# Patient Record
Sex: Female | Born: 1995
Health system: Southern US, Community
[De-identification: ages and names within clinical notes are randomized; demographics above are authoritative.]

## PROBLEM LIST (undated history)

## (undated) ENCOUNTER — Inpatient Hospital Stay (HOSPITAL_COMMUNITY): Payer: Self-pay

## (undated) DIAGNOSIS — K8020 Calculus of gallbladder without cholecystitis without obstruction: Secondary | ICD-10-CM

## (undated) DIAGNOSIS — K760 Fatty (change of) liver, not elsewhere classified: Secondary | ICD-10-CM

## (undated) DIAGNOSIS — E119 Type 2 diabetes mellitus without complications: Secondary | ICD-10-CM

## (undated) DIAGNOSIS — T7840XA Allergy, unspecified, initial encounter: Secondary | ICD-10-CM

## (undated) DIAGNOSIS — K219 Gastro-esophageal reflux disease without esophagitis: Secondary | ICD-10-CM

## (undated) DIAGNOSIS — O165 Unspecified maternal hypertension, complicating the puerperium: Secondary | ICD-10-CM

## (undated) DIAGNOSIS — I10 Essential (primary) hypertension: Secondary | ICD-10-CM

## (undated) DIAGNOSIS — Z6841 Body Mass Index (BMI) 40.0 and over, adult: Secondary | ICD-10-CM

## (undated) DIAGNOSIS — O24419 Gestational diabetes mellitus in pregnancy, unspecified control: Secondary | ICD-10-CM

## (undated) HISTORY — PX: TONSILLECTOMY AND ADENOIDECTOMY: SHX28

## (undated) HISTORY — DX: Allergy, unspecified, initial encounter: T78.40XA

## (undated) HISTORY — DX: Gastro-esophageal reflux disease without esophagitis: K21.9

## (undated) HISTORY — PX: MYRINGOTOMY: SUR874

---
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
## 2016-11-27 NOTE — Progress Notes (Signed)
Subjective: RU:EAVWUJWJXCC:establish care HPI: Anna [name] is a 21 y.o. female presenting to clinic today for:  She reports that she has been in fairly good health with the exception of a recent external ear infection.   She was seen at the urgent care and treated with a topical eardrop.  She notes improvement in her symptoms.  No fevers, chills, dizziness, ear pain.  Patient was previously seen by pediatrician but notes that she has not been seen in several years.  She notes a past medical history significant for hypertension as a child.  She reports that she was treated with antihypertensive medications but this induced hypotension and therefore these were discontinued.  She also reports a history of possible Hashimoto's.  She reports that she had fluctuations in her thyroid levels and was seen by an endocrinologist.  She has not been put on medications.  She has a maternal aunt with a history of thyroid disorder but no other immediate family with thyroid disorder or autoimmune disorder.  She reports decreased energy, difficulty losing weight and cold intolerance.  Patient reports heavy menstrual cycles as well.  She does note that they only last about 4-5 days but that she can use a pack and a half of pads and just a couple of days.   she has never had a Pap smear.  She has been sexually active in the past but is not currently.  No abnormal vaginal discharge, pelvic pain.  Past Medical History:  Diagnosis Date  . Allergy   . GERD (gastroesophageal reflux disease)    Past Surgical History:  Procedure Laterality Date  . TONSILLECTOMY AND ADENOIDECTOMY     Social History   Socioeconomic History  . Marital status: Single    Spouse name: Not on file  . Number of children: Not on file  . Years of education: Not on file  . Highest education level: Not on file  Social Needs  . Financial resource strain: Not on file  . Food insecurity - worry: Not on file  . Food insecurity - inability: Not  on file  . Transportation needs - medical: Not on file  . Transportation needs - non-medical: Not on file  Occupational History  . 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 Smoking status: Never Smoker  . Smokeless tobacco: Never Used  Substance and Sexual Activity  . Alcohol use: No  . Drug use: No  . Sexual activity: No    Birth control/protection: Abstinence  Other Topics Concern  . Not on file  Social History Narrative  . Not on file   Current Meds  Medication Sig  . ibuprofen (ADVIL,MOTRIN) 800 MG tablet Take 800 mg every 8 (eight) hours as needed by mouth.   Family History  Problem Relation Age of Onset  . Asthma Mother   . Anemia Mother   . Hypertension Father   . Cancer Maternal Grandfather   . Hypertension Paternal Grandmother   . Heart failure Paternal Grandmother    Allergies  Allergen Reactions  . Diflucan [Fluconazole] Rash     Health Maintenance: Flu shot due ROS: Per HPI  Objective: Office vital signs reviewed. BP 125/77   Pulse 86   Temp 97.9 F (36.6 C) (Oral)   Ht 5\' 2"  (1.575 m)   Wt (!) 379 lb (171.9 kg)   BMI 69.32 kg/m   Physical Examination:  General: Awake, alert, obese, No acute distress HEENT: Normal  Neck: No masses palpated. No lymphadenopathy; acanthosis nigricans appreciated.  Thyroid not palpable.  No nodules palpable.    Ears: Tympanic membranes intact, normal light reflex, no erythema, no bulging; mild inflammation of the left external auditory canal.  No tenderness to the tragus or mastoid.    Eyes: PERRLA, extraocular movement in tact, sclera white, no exophthalmos    Nose: nasal turbinates moist, no nasal discharge    Throat: moist mucus membranes, no erythema, no tonsillar exudate.  Airway is patent Cardio: regular rate and rhythm, S1S2 heard, no murmurs appreciated Pulm: clear to auscultation bilaterally, no wheezes, rhonchi or rales; normal work of breathing on room  air Extremities: warm, well perfused, No edema, cyanosis or clubbing; +2 pulses bilaterally Psych: Speech normal, affect appropriate, mood stable, good eye contact, thought process normal  Depression screen PHQ 2/9 12/04/2016  Decreased Interest 0  Down, Depressed, Hopeless 0  PHQ - 2 Score 0   Assessment/ Plan: 21 y.o. female   Obesity BMI of greater than 60.  I did review with patient consideration for initiation of anti-obesity medications.  I have placed a referral to nutrition.  TSH, CMP, A1c and lipid panel was obtained today.  Will rule out metabolic cause given her history of possible Hashimoto's.  I will contact patient with results.  I did recommend that she follow-up in the next couple of weeks to discuss lifestyle changes.  Will consider starting medications.    Menorrhagia with regular cycle Possibly related to PCOS.  No formal diagnosis of this in the past but patient is obese, has evidence on physical exam of insulin resistance and noted hirsutism during questioning today.  It appears that she has not tolerated metformin well in the past.  I did discuss with her consideration for initiation of oral contraceptive pills.  We will continue to address this at her physical exam.  Labs ordered today to evaluate for metabolic disorder.  Encounter to establish care with new doctor Release of information form completed.  Will obtain medical records from previous pediatrician.  Patient is scheduled for annual physical exam with Pap smear. - Amb ref to Medical Nutrition Therapy-MNT  Screening for deficiency anemia - CBC  History of hypertension in pediatric patient - Amb ref to Medical Nutrition Therapy-MNT  Menorrhagia with regular cycle - CBC  Refused influenza vaccine Counseling provided.  We will continue to address this with each visit.  Raliegh Ip, DO [facility] [phone]

## 2016-12-04 ENCOUNTER — Ambulatory Visit (INDEPENDENT_AMBULATORY_CARE_PROVIDER_SITE_OTHER): Payer: Federal, State, Local not specified - PPO | Admitting: Family Medicine

## 2016-12-04 ENCOUNTER — Other Ambulatory Visit: Payer: Self-pay | Admitting: Family Medicine

## 2016-12-04 ENCOUNTER — Encounter: Payer: Self-pay | Admitting: Family Medicine

## 2016-12-04 VITALS — BP 125/77 | HR 86 | Temp 97.9°F | Ht 62.0 in | Wt 379.0 lb

## 2016-12-04 DIAGNOSIS — Z6841 Body Mass Index (BMI) 40.0 and over, adult: Secondary | ICD-10-CM | POA: Diagnosis not present

## 2016-12-04 DIAGNOSIS — N920 Excessive and frequent menstruation with regular cycle: Secondary | ICD-10-CM | POA: Diagnosis not present

## 2016-12-04 DIAGNOSIS — Z130 Encounter for screening for diseases of the blood and blood-forming organs and certain disorders involving the immune mechanism: Secondary | ICD-10-CM | POA: Diagnosis not present

## 2016-12-04 DIAGNOSIS — Z7689 Persons encountering health services in other specified circumstances: Secondary | ICD-10-CM

## 2016-12-04 DIAGNOSIS — Z2821 Immunization not carried out because of patient refusal: Secondary | ICD-10-CM

## 2016-12-04 DIAGNOSIS — Z8679 Personal history of other diseases of the circulatory system: Secondary | ICD-10-CM | POA: Diagnosis not present

## 2016-12-04 LAB — BAYER DCA HB A1C WAIVED: HB A1C (BAYER DCA - WAIVED): 5.7 % (ref <7.0)

## 2016-12-04 NOTE — Patient Instructions (Addendum)
I value your feedback and appreciate you entrusting us with your care.  If you get a survey, I would appreciate your taking the time to let us know what your experience was like.  It was a pleasure seeing you today, Anna [name]. Please make an appointment to see me in 2 weeks for annual exam w/ pap smear.  Please feel free to call our office if any questions or concerns arise.  In preparation for your appointment:  1. What are your "eating/ binging" triggers? 2. Does obesity run in your family? 3. What has been your highest weight/ lowest weight in the past? 4. What diets/ medications have you tried and did they work?  What side effects did they have if any? 5. Why do you want to lose weight? 6. What is your goal weight? 7. Would you be willing to see a dietician?

## 2016-12-04 NOTE — Assessment & Plan Note (Signed)
Possibly related to PCOS.  No formal diagnosis of this in the past but patient is obese, has evidence on physical exam of insulin resistance and noted hirsutism during questioning today.  It appears that she has not tolerated metformin well in the past.  I did discuss with her consideration for initiation of oral contraceptive pills.  We will continue to address this at her physical exam.  Labs ordered today to evaluate for metabolic disorder.

## 2016-12-04 NOTE — Assessment & Plan Note (Addendum)
BMI of greater than 60.  I did review with patient consideration for initiation of anti-obesity medications.  I have placed a referral to nutrition.  TSH, CMP, A1c and lipid panel was obtained today.  Will rule out metabolic cause given her history of possible Hashimoto's.  I will contact patient with results.  I did recommend that she follow-up in the next couple of weeks to discuss lifestyle changes.  Will consider starting medications.

## 2016-12-05 ENCOUNTER — Other Ambulatory Visit: Payer: Self-pay | Admitting: Family Medicine

## 2016-12-05 ENCOUNTER — Telehealth: Payer: Self-pay | Admitting: Family Medicine

## 2016-12-05 DIAGNOSIS — D509 Iron deficiency anemia, unspecified: Secondary | ICD-10-CM

## 2016-12-05 LAB — CMP14+EGFR
A/G RATIO: 1.4 (ref 1.2–2.2)
ALBUMIN: 4 g/dL (ref 3.5–5.5)
ALK PHOS: 53 IU/L (ref 39–117)
ALT: 25 IU/L (ref 0–32)
AST: 19 IU/L (ref 0–40)
BUN / CREAT RATIO: 16 (ref 9–23)
BUN: 10 mg/dL (ref 6–20)
Bilirubin Total: 0.4 mg/dL (ref 0.0–1.2)
CO2: 23 mmol/L (ref 20–29)
CREATININE: 0.63 mg/dL (ref 0.57–1.00)
Calcium: 9 mg/dL (ref 8.7–10.2)
Chloride: 103 mmol/L (ref 96–106)
GFR calc Af Amer: 148 mL/min/{1.73_m2} (ref >59)
GFR calc non Af Amer: 129 mL/min/{1.73_m2} (ref >59)
GLOBULIN, TOTAL: 2.9 g/dL (ref 1.5–4.5)
Glucose: 87 mg/dL (ref 65–99)
POTASSIUM: 4.2 mmol/L (ref 3.5–5.2)
SODIUM: 140 mmol/L (ref 134–144)
Total Protein: 6.9 g/dL (ref 6.0–8.5)

## 2016-12-05 LAB — TSH: TSH: 2.52 u[IU]/mL (ref 0.450–4.500)

## 2016-12-05 LAB — CBC
HEMATOCRIT: 34.7 % (ref 34.0–46.6)
Hemoglobin: 10.7 g/dL — ABNORMAL LOW (ref 11.1–15.9)
MCH: 24.8 pg — ABNORMAL LOW (ref 26.6–33.0)
MCHC: 30.8 g/dL — AB (ref 31.5–35.7)
MCV: 80 fL (ref 79–97)
PLATELETS: 317 10*3/uL (ref 150–379)
RBC: 4.32 x10E6/uL (ref 3.77–5.28)
RDW: 14.5 % (ref 12.3–15.4)
WBC: 7.6 10*3/uL (ref 3.4–10.8)

## 2016-12-05 LAB — LIPID PANEL
CHOL/HDL RATIO: 4.2 ratio (ref 0.0–4.4)
Cholesterol, Total: 174 mg/dL (ref 100–199)
HDL: 41 mg/dL (ref >39)
LDL Calculated: 119 mg/dL — ABNORMAL HIGH (ref 0–99)
TRIGLYCERIDES: 69 mg/dL (ref 0–149)
VLDL Cholesterol Cal: 14 mg/dL (ref 5–40)

## 2016-12-05 MED ORDER — FERROUS SULFATE 324 (65 FE) MG PO TBEC: 324.0000 mg | DELAYED_RELEASE_TABLET | Freq: Two times a day (BID) | ORAL | 2 refills | Status: DC

## 2016-12-05 MED ORDER — CETIRIZINE HCL 10 MG PO TABS: 10.0000 mg | ORAL_TABLET | Freq: Every day | ORAL | 4 refills | Status: DC

## 2016-12-05 NOTE — Progress Notes (Signed)
CBC    Component Value Date/Time   WBC 7.6 12/04/2016 1512   RBC 4.32 12/04/2016 1512   HGB 10.7 (L) 12/04/2016 1512   HCT 34.7 12/04/2016 1512   PLT 317 12/04/2016 1512   MCV 80 12/04/2016 1512   MCH 24.8 (L) 12/04/2016 1512   MCHC 30.8 (L) 12/04/2016 1512   RDW 14.5 12/04/2016 1512    Initiate Ferrous Sulfate BID w/ VIt C.  Repeat CBC in 3 months

## 2016-12-05 NOTE — Telephone Encounter (Signed)
Patient was seen yesterday- please advise and send back to the pools.

## 2016-12-05 NOTE — Telephone Encounter (Signed)
Zyrtec sent to pharmacy

## 2016-12-18 ENCOUNTER — Ambulatory Visit: Payer: Federal, State, Local not specified - PPO | Admitting: Family Medicine

## 2016-12-18 NOTE — Progress Notes (Deleted)
   Anna [name] is a 21 y.o. female presents to office today for annual physical exam examination.    Concerns today include: 1. ***  Occupation: ***, Marital status: ***, Substance use: *** Diet: ***, Exercise: *** Last eye exam: *** Last dental exam: *** Last pap smear: never has had one Immunizations needed: Flu Vaccine: {YES/NO/WILD ZOXWR:60454}CARDS:18581}  Tdap Vaccine: {YES/NO/WILD UJWJX:91478}CARDS:18581}  - every 4668yrs - (<3 lifetime doses or unknown): all wounds -- look up need for Tetanus IG - (>=3 lifetime doses): clean/minor wound if >7768yrs from previous; all other wounds if >2847yrs from previous Zoster Vaccine: {YES/NO/WILD CARDS:18581} (those >50yo, once) Pneumonia Vaccine: {YES/NO/WILD GNFAO:13086}CARDS:18581} (those w/ risk factors) - (<3123yr) Both: Immunocompromised, cochlear implant, CSF leak, asplenic, sickle cell, Chronic Renal Failure - (<2723yr) PPSV-23 only: Heart dz, lung disease, DM, tobacco abuse, alcoholism, cirrhosis/liver disease. - (>7523yr): PPSV13 then PPSV23 in 6-12mths;  - (>2623yr): repeat PPSV23 once if pt received prior to 21yo and 5047yrs have passed  Past Medical History:  Diagnosis Date  . Allergy   . GERD (gastroesophageal reflux disease)    Social History   Socioeconomic History  . Marital status: Single    Spouse name: Not on file  . Number of children: Not on file  . Years of education: Not on file  . Highest education level: Not on file  Social Needs  . Financial resource strain: Not on file  . Food insecurity - worry: Not on file  . Food insecurity - inability: Not on file  . Transportation needs - medical: Not on file  . Transportation needs - non-medical: Not on file  Occupational History  . 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 Smoking status: Never Smoker  . Smokeless tobacco: Never Used  Substance and Sexual Activity  . Alcohol use: No  . Drug use: No  . Sexual activity: No    Birth control/protection:  Abstinence  Other Topics Concern  . Not on file  Social History Narrative  . Not on file   Past Surgical History:  Procedure Laterality Date  . TONSILLECTOMY AND ADENOIDECTOMY     Family History  Problem Relation Age of Onset  . Asthma Mother   . Anemia Mother   . Hypertension Father   . Cancer Maternal Grandfather   . Hypertension Paternal Grandmother   . Heart failure Paternal Grandmother     Current Outpatient Medications:  .  cetirizine (ZYRTEC) 10 MG tablet, Take 1 tablet (10 mg total) daily by mouth., Disp: 90 tablet, Rfl: 4 .  ferrous sulfate 324 (65 Fe) MG TBEC, Take 1 tablet (324 mg total) 2 (two) times daily by mouth., Disp: 60 tablet, Rfl: 2 .  ibuprofen (ADVIL,MOTRIN) 800 MG tablet, Take 800 mg every 8 (eight) hours as needed by mouth., Disp: , Rfl:    ROS: Review of Systems {ros; complete:30496}    Physical exam {Exam, [phone]}    Assessment/ Plan: Anna [name] here for annual physical exam.   No problem-specific Assessment & Plan notes found for this encounter.   Counseled on healthy lifestyle choices, including diet (rich in fruits, vegetables and lean meats and low in salt and simple carbohydrates) and exercise (at least 30 minutes of moderate physical activity daily).  Patient to follow up in 1 year for annual exam or sooner if needed.  Kaityln Kallstrom M. Nadine CountsGottschalk, DO

## 2016-12-24 ENCOUNTER — Other Ambulatory Visit: Payer: Federal, State, Local not specified - PPO | Admitting: Family Medicine

## 2016-12-24 NOTE — Progress Notes (Deleted)
   Anna [name] is a 21 y.o. female presents to office today for annual physical exam examination.    Concerns today include: 1. ***  Occupation: ***, Marital status: ***, Substance use: *** Diet: ***, Exercise: *** Last eye exam: *** Last dental exam: *** Last pap smear: never Refills needed today: *** Immunizations needed: Flu Vaccine: {YES/NO/WILD ZOXWR:60454}CARDS:18581}  Tdap Vaccine: {YES/NO/WILD UJWJX:91478}CARDS:18581}  - every 4864yrs - (<3 lifetime doses or unknown): all wounds -- look up need for Tetanus IG - (>=3 lifetime doses): clean/minor wound if >3464yrs from previous; all other wounds if >35315yrs from previous Zoster Vaccine: {YES/NO/WILD CARDS:18581} (those >50yo, once) Pneumonia Vaccine: {YES/NO/WILD GNFAO:13086}CARDS:18581} (those w/ risk factors) - (<5189yr) Both: Immunocompromised, cochlear implant, CSF leak, asplenic, sickle cell, Chronic Renal Failure - (<7989yr) PPSV-23 only: Heart dz, lung disease, DM, tobacco abuse, alcoholism, cirrhosis/liver disease. - (>4389yr): PPSV13 then PPSV23 in 6-12mths;  - (>5989yr): repeat PPSV23 once if pt received prior to 21yo and 71315yrs have passed  Past Medical History:  Diagnosis Date  . Allergy   . GERD (gastroesophageal reflux disease)    Social History   Socioeconomic History  . Marital status: Single    Spouse name: Not on file  . Number of children: Not on file  . Years of education: Not on file  . Highest education level: Not on file  Social Needs  . Financial resource strain: Not on file  . Food insecurity - worry: Not on file  . Food insecurity - inability: Not on file  . Transportation needs - medical: Not on file  . Transportation needs - non-medical: Not on file  Occupational History  . 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 Smoking status: Never Smoker  . Smokeless tobacco: Never Used  Substance and Sexual Activity  . Alcohol use: No  . Drug use: No  . Sexual activity: No    Birth  control/protection: Abstinence  Other Topics Concern  . Not on file  Social History Narrative  . Not on file   Past Surgical History:  Procedure Laterality Date  . TONSILLECTOMY AND ADENOIDECTOMY     Family History  Problem Relation Age of Onset  . Asthma Mother   . Anemia Mother   . Hypertension Father   . Cancer Maternal Grandfather   . Hypertension Paternal Grandmother   . Heart failure Paternal Grandmother     Current Outpatient Medications:  .  cetirizine (ZYRTEC) 10 MG tablet, Take 1 tablet (10 mg total) daily by mouth., Disp: 90 tablet, Rfl: 4 .  ferrous sulfate 324 (65 Fe) MG TBEC, Take 1 tablet (324 mg total) 2 (two) times daily by mouth., Disp: 60 tablet, Rfl: 2 .  ibuprofen (ADVIL,MOTRIN) 800 MG tablet, Take 800 mg every 8 (eight) hours as needed by mouth., Disp: , Rfl:    ROS: Review of Systems {ros; complete:30496}    Physical exam {Exam, [phone]}    Assessment/ Plan: Anna [name] Obst here for annual physical exam.   No problem-specific Assessment & Plan notes found for this encounter.   Counseled on healthy lifestyle choices, including diet (rich in fruits, vegetables and lean meats and low in salt and simple carbohydrates) and exercise (at least 30 minutes of moderate physical activity daily).  Patient to follow up in 1 year for annual exam or sooner if needed.  Anna [name] M. Nadine CountsGottschalk, DO

## 2016-12-25 ENCOUNTER — Encounter: Payer: Self-pay | Admitting: Family Medicine

## 2017-01-28 ENCOUNTER — Ambulatory Visit: Payer: Federal, State, Local not specified - PPO | Admitting: Nutrition

## 2017-01-28 ENCOUNTER — Telehealth: Payer: Self-pay | Admitting: Nutrition

## 2017-01-28 NOTE — Telephone Encounter (Signed)
Phone number no longer working. Mothers phone number is not correct.

## 2017-03-01 ENCOUNTER — Other Ambulatory Visit: Payer: Self-pay | Admitting: Obstetrics and Gynecology

## 2017-03-01 DIAGNOSIS — O3680X0 Pregnancy with inconclusive fetal viability, not applicable or unspecified: Secondary | ICD-10-CM

## 2017-03-04 ENCOUNTER — Ambulatory Visit (INDEPENDENT_AMBULATORY_CARE_PROVIDER_SITE_OTHER): Payer: Federal, State, Local not specified - PPO

## 2017-03-04 ENCOUNTER — Encounter (INDEPENDENT_AMBULATORY_CARE_PROVIDER_SITE_OTHER): Payer: Self-pay

## 2017-03-04 DIAGNOSIS — O3680X0 Pregnancy with inconclusive fetal viability, not applicable or unspecified: Secondary | ICD-10-CM

## 2017-03-04 DIAGNOSIS — Z3A01 Less than 8 weeks gestation of pregnancy: Secondary | ICD-10-CM

## 2017-03-04 NOTE — Progress Notes (Signed)
US 7+4 wks,single IUP w/ys,positive fht 171 bpm,normal ovaries bilat,crl 13.58 mm,EDD 10/17/2017

## 2017-03-14 ENCOUNTER — Ambulatory Visit: Payer: Federal, State, Local not specified - PPO | Admitting: *Deleted

## 2017-03-14 ENCOUNTER — Encounter: Payer: Federal, State, Local not specified - PPO | Admitting: Advanced Practice Midwife

## 2017-03-19 ENCOUNTER — Ambulatory Visit: Payer: Federal, State, Local not specified - PPO | Admitting: *Deleted

## 2017-03-19 ENCOUNTER — Encounter: Payer: Self-pay | Admitting: Obstetrics & Gynecology

## 2017-03-19 ENCOUNTER — Encounter: Payer: Federal, State, Local not specified - PPO | Admitting: Women's Health

## 2017-03-21 ENCOUNTER — Ambulatory Visit (INDEPENDENT_AMBULATORY_CARE_PROVIDER_SITE_OTHER): Payer: Federal, State, Local not specified - PPO | Admitting: Women's Health

## 2017-03-21 ENCOUNTER — Ambulatory Visit: Payer: Federal, State, Local not specified - PPO | Admitting: *Deleted

## 2017-03-21 ENCOUNTER — Encounter: Payer: Federal, State, Local not specified - PPO | Admitting: Advanced Practice Midwife

## 2017-03-21 ENCOUNTER — Other Ambulatory Visit (HOSPITAL_COMMUNITY)
Admission: RE | Admit: 2017-03-21 | Discharge: 2017-03-21 | Disposition: A | Discharge status: Home / Self Care | Payer: Federal, State, Local not specified - PPO | Source: Ambulatory Visit | Attending: Advanced Practice Midwife | Admitting: Advanced Practice Midwife

## 2017-03-21 ENCOUNTER — Encounter: Payer: Self-pay | Admitting: Women's Health

## 2017-03-21 VITALS — BP 120/70 | HR 86 | Wt 376.4 lb

## 2017-03-21 DIAGNOSIS — Z3682 Encounter for antenatal screening for nuchal translucency: Secondary | ICD-10-CM | POA: Diagnosis not present

## 2017-03-21 DIAGNOSIS — O0990 Supervision of high risk pregnancy, unspecified, unspecified trimester: Secondary | ICD-10-CM | POA: Insufficient documentation

## 2017-03-21 DIAGNOSIS — Z23 Encounter for immunization: Secondary | ICD-10-CM | POA: Diagnosis not present

## 2017-03-21 DIAGNOSIS — Z124 Encounter for screening for malignant neoplasm of cervix: Secondary | ICD-10-CM

## 2017-03-21 DIAGNOSIS — O23591 Infection of other part of genital tract in pregnancy, first trimester: Secondary | ICD-10-CM

## 2017-03-21 DIAGNOSIS — Z3A10 10 weeks gestation of pregnancy: Secondary | ICD-10-CM

## 2017-03-21 DIAGNOSIS — B373 Candidiasis of vulva and vagina: Secondary | ICD-10-CM | POA: Diagnosis not present

## 2017-03-21 DIAGNOSIS — Z331 Pregnant state, incidental: Secondary | ICD-10-CM

## 2017-03-21 DIAGNOSIS — Z3401 Encounter for supervision of normal first pregnancy, first trimester: Secondary | ICD-10-CM

## 2017-03-21 DIAGNOSIS — Z1389 Encounter for screening for other disorder: Secondary | ICD-10-CM

## 2017-03-21 LAB — POCT URINALYSIS DIPSTICK
Blood, UA: NEGATIVE
Glucose, UA: NEGATIVE
Ketones, UA: NEGATIVE
Nitrite, UA: NEGATIVE

## 2017-03-21 NOTE — Progress Notes (Signed)
INITIAL OBSTETRICAL VISIT Patient name: Anna [name] MRN [id number]  Date of birth: 1995-07-16 Chief Complaint:   Initial Prenatal Visit (vaginal discharge)  History of Present Illness:   Anna [name] is a 22 y.o. G1P0 African American female at [redacted]w[redacted]d by 7wk u/s, with an Estimated Date of Delivery: 10/17/17 being seen today for her initial obstetrical visit.   Her obstetrical history is significant for primigravida.  States she was told she had pre-diabetes and CHTN as a child, was put on antihypertensives briefly but stopped d/t hypotension. A1C in Nov 5.7 Today she reports vaginal d/c, itching at first, none now. Mild nausea, no vomiting- declines meds.  Patient's last menstrual period was 12/30/2016 (approximate). Last pap never. Results were: n/a Review of Systems:   Pertinent items are noted in HPI Denies cramping/contractions, leakage of fluid, vaginal bleeding, abnormal vaginal discharge w/ itching/odor/irritation, headaches, visual changes, shortness of breath, chest pain, abdominal pain, severe nausea/vomiting, or problems with urination or bowel movements unless otherwise stated above.  Pertinent History Reviewed:  Reviewed past medical,surgical, social, obstetrical and family history.  Reviewed problem list, medications and allergies. OB History  Gravida Para Term Preterm AB Living  1            SAB TAB Ectopic Multiple Live Births               # Outcome Date GA Lbr Len/2nd Weight Sex Delivery Anes PTL Lv  1 Current              Physical Assessment:   Vitals:   03/21/17 1412  BP: 120/70  Pulse: 86  Weight: (!) 376 lb 6.4 oz (170.7 kg)  Body mass index is 68.84 kg/m.       Physical Examination:  General appearance - well appearing, and in no distress  Mental status - alert, oriented to person, place, and time  Psych:  She has a normal mood and affect  Skin - warm and dry, normal color, no suspicious lesions noted  Chest - effort normal, all lung  fields clear to auscultation bilaterally  Heart - normal rate and regular rhythm  Abdomen - soft, nontender  Extremities:  No swelling or varicosities noted  Pelvic - VULVA: normal appearing vulva with no masses, tenderness or lesions  VAGINA: normal appearing vagina with normal color and thick clumpy white/yellow nonodorous discharge c/w yeast, no lesions  CERVIX: normal appearing cervix without discharge or lesions, no CMT  Thin prep pap is done w/ reflex HR HPV cotesting  Fetal Heart Rate (bpm): +u/s via informal transabdominal u/s w/ +active fetus  Results for orders placed or performed in visit on 03/21/17 (from the past 24 hour(s))  POCT urinalysis dipstick   Collection Time: 03/21/17  2:48 PM  Result Value Ref Range   Color, UA     Clarity, UA     Glucose, UA neg    Bilirubin, UA     Ketones, UA neg    Spec Grav, UA  1.010 - 1.025   Blood, UA neg    pH, UA  5.0 - 8.0   Protein, UA trace    Urobilinogen, UA  0.2 or 1.0 E.U./dL   Nitrite, UA neg    Leukocytes, UA Large (3+) (A) Negative   Appearance     Odor      Assessment & Plan:  1) Low-[name] Pregnancy G1P0 at [redacted]w[redacted]d with an Estimated Date of Delivery: 10/17/17   2) Initial OB visit  3) H/O  pre-diabetes, will get early 2hr GTT  4) H/O CHTN as child- no current meds  5) Obesity> BMI 68  6) Vaginal candida> otc 7 night yeast cream  Meds: No orders of the defined types were placed in this encounter.   Initial labs obtained Continue prenatal vitamins Reviewed n/v relief measures and warning s/s to report Reviewed recommended weight gain based on pre-gravid BMI Encouraged well-balanced diet Genetic Screening discussed Integrated Screen: requested Cystic fibrosis screening discussed requested Ultrasound discussed; fetal survey: requested CCNC completed>PCM not here Flu shot today  Follow-up: Return in about 3 weeks (around 04/11/2017) for LROB, US:NT+1stIT and early 2hr GTT.   Orders Placed This Encounter    Procedures  . Urine Culture  . Flu Vaccine QUAD 36+ mos IM  . Obstetric Panel, Including HIV  . Urinalysis, Routine w reflex microscopic  . Sickle cell screen  . Pain Management Screening Profile (10S)  . Cystic Fibrosis Mutation 97  . POCT urinalysis dipstick    Cheral MarkerKimberly R Legaci Tarman CNM, [facility] Center[facility]NP-BC 03/21/2017 3:06 PM

## 2017-03-21 NOTE — Patient Instructions (Addendum)
Elwin Sleight, I greatly value your feedback.  If you receive a survey following your visit with [place] today, we appreciate you taking the time to fill it out.  Thanks, Joellyn Haff, CNM, WHNP-BC  You will have your sugar test next visit.  Please do not eat or drink anything after midnight the night before you come, not even water.  You will be here for at least two hours.     You can try a 7 night over the counter yeast cream such as Monistat 7. It does not need to be brand name, generic is fine, but please make sure it is a 7 night cream.     Nausea & Vomiting  Have saltine crackers or pretzels by your bed and eat a few bites before you raise your head out of bed in the morning  Eat small frequent meals throughout the day instead of large meals  Drink plenty of fluids throughout the day to stay hydrated, just don't drink a lot of fluids with your meals.  This can make your stomach fill up faster making you feel sick  Do not brush your teeth right after you eat  Products with real ginger are good for nausea, like ginger ale and ginger hard candy Make sure it says made with real ginger!  Sucking on sour candy like lemon heads is also good for nausea  If your prenatal vitamins make you nauseated, take them at night so you will sleep through the nausea  Sea Bands  If you feel like you need medicine for the nausea & vomiting please let us know  If you are unable to keep any fluids or food down please let us know   Constipation  Drink plenty of fluid, preferably water, throughout the day  Eat foods high in fiber such as fruits, vegetables, and grains  Exercise, such as walking, is a good way to keep your bowels regular  Drink warm fluids, especially warm prune juice, or decaf coffee  Eat a 1/2 cup of real oatmeal (not instant), 1/2 cup applesauce, and 1/2-1 cup warm prune juice every day  If needed, you may take Colace (docusate sodium) stool softener once or twice a day to help  keep the stool soft. If you are pregnant, wait until you are out of your first trimester (12-14 weeks of pregnancy)  If you still are having problems with constipation, you may take Miralax once daily as needed to help keep your bowels regular.  If you are pregnant, wait until you are out of your first trimester (12-14 weeks of pregnancy)   First Trimester of Pregnancy The first trimester of pregnancy is from week 1 until the end of week 12 (months 1 through 3). A week after a sperm fertilizes an egg, the egg will implant on the wall of the uterus. This embryo will begin to develop into a baby. Genes from you and your partner are forming the baby. The female genes determine whether the baby is a boy or a girl. At 6-8 weeks, the eyes and face are formed, and the heartbeat can be seen on ultrasound. At the end of 12 weeks, all the baby's organs are formed.  Now that you are pregnant, you will want to do everything you can to have a healthy baby. Two of the most important things are to get good prenatal care and to follow your health care provider's instructions. Prenatal care is all the medical care you receive before the baby's  birth. This care will help prevent, find, and treat any problems during the pregnancy and childbirth. BODY CHANGES Your body goes through many changes during pregnancy. The changes vary from woman to woman.   You may gain or lose a couple of pounds at first.  You may feel sick to your stomach (nauseous) and throw up (vomit). If the vomiting is uncontrollable, call your health care provider.  You may tire easily.  You may develop headaches that can be relieved by medicines approved by your health care provider.  You may urinate more often. Painful urination may mean you have a bladder infection.  You may develop heartburn as a result of your pregnancy.  You may develop constipation because certain hormones are causing the muscles that push waste through your intestines to  slow down.  You may develop hemorrhoids or swollen, bulging veins (varicose veins).  Your breasts may begin to grow larger and become tender. Your nipples may stick out more, and the tissue that surrounds them (areola) may become darker.  Your gums may bleed and may be sensitive to brushing and flossing.  Dark spots or blotches (chloasma, mask of pregnancy) may develop on your face. This will likely fade after the baby is born.  Your menstrual periods will stop.  You may have a loss of appetite.  You may develop cravings for certain kinds of food.  You may have changes in your emotions from day to day, such as being excited to be pregnant or being concerned that something may go wrong with the pregnancy and baby.  You may have more vivid and strange dreams.  You may have changes in your hair. These can include thickening of your hair, rapid growth, and changes in texture. Some women also have hair loss during or after pregnancy, or hair that feels dry or thin. Your hair will most likely return to normal after your baby is born. WHAT TO EXPECT AT YOUR PRENATAL VISITS During a routine prenatal visit:  You will be weighed to make sure you and the baby are growing normally.  Your blood pressure will be taken.  Your abdomen will be measured to track your baby's growth.  The fetal heartbeat will be listened to starting around week 10 or 12 of your pregnancy.  Test results from any previous visits will be discussed. Your health care provider may ask you:  How you are feeling.  If you are feeling the baby move.  If you have had any abnormal symptoms, such as leaking fluid, bleeding, severe headaches, or abdominal cramping.  If you have any questions. Other tests that may be performed during your first trimester include:  Blood tests to find your blood type and to check for the presence of any previous infections. They will also be used to check for low iron levels (anemia) and Rh  antibodies. Later in the pregnancy, blood tests for diabetes will be done along with other tests if problems develop.  Urine tests to check for infections, diabetes, or protein in the urine.  An ultrasound to confirm the proper growth and development of the baby.  An amniocentesis to check for possible genetic problems.  Fetal screens for spina bifida and Down syndrome.  You may need other tests to make sure you and the baby are doing well. HOME CARE INSTRUCTIONS  Medicines  Follow your health care provider's instructions regarding medicine use. Specific medicines may be either safe or unsafe to take during pregnancy.  Take your prenatal vitamins  as directed.  If you develop constipation, try taking a stool softener if your health care provider approves. Diet  Eat regular, well-balanced meals. Choose a variety of foods, such as meat or vegetable-based protein, fish, milk and low-fat dairy products, vegetables, fruits, and whole grain breads and cereals. Your health care provider will help you determine the amount of weight gain that is right for you.  Avoid raw meat and uncooked cheese. These carry germs that can cause birth defects in the baby.  Eating four or five small meals rather than three large meals a day may help relieve nausea and vomiting. If you start to feel nauseous, eating a few soda crackers can be helpful. Drinking liquids between meals instead of during meals also seems to help nausea and vomiting.  If you develop constipation, eat more high-fiber foods, such as fresh vegetables or fruit and whole grains. Drink enough fluids to keep your urine clear or pale yellow. Activity and Exercise  Exercise only as directed by your health care provider. Exercising will help you:  Control your weight.  Stay in shape.  Be prepared for labor and delivery.  Experiencing pain or cramping in the lower abdomen or low back is a good sign that you should stop exercising. Check  with your health care provider before continuing normal exercises.  Try to avoid standing for long periods of time. Move your legs often if you must stand in one place for a long time.  Avoid heavy lifting.  Wear low-heeled shoes, and practice good posture.  You may continue to have sex unless your health care provider directs you otherwise. Relief of Pain or Discomfort  Wear a good support bra for breast tenderness.   Take warm sitz baths to soothe any pain or discomfort caused by hemorrhoids. Use hemorrhoid cream if your health care provider approves.   Rest with your legs elevated if you have leg cramps or low back pain.  If you develop varicose veins in your legs, wear support hose. Elevate your feet for 15 minutes, 3-4 times a day. Limit salt in your diet. Prenatal Care  Schedule your prenatal visits by the twelfth week of pregnancy. They are usually scheduled monthly at first, then more often in the last 2 months before delivery.  Write down your questions. Take them to your prenatal visits.  Keep all your prenatal visits as directed by your health care provider. Safety  Wear your seat belt at all times when driving.  Make a list of emergency phone numbers, including numbers for family, friends, the hospital, and police and fire departments. General Tips  Ask your health care provider for a referral to a local prenatal education class. Begin classes no later than at the beginning of month 6 of your pregnancy.  Ask for help if you have counseling or nutritional needs during pregnancy. Your health care provider can offer advice or refer you to specialists for help with various needs.  Do not use hot tubs, steam rooms, or saunas.  Do not douche or use tampons or scented sanitary pads.  Do not cross your legs for long periods of time.  Avoid cat litter boxes and soil used by cats. These carry germs that can cause birth defects in the baby and possibly loss of the fetus  by miscarriage or stillbirth.  Avoid all smoking, herbs, alcohol, and medicines not prescribed by your health care provider. Chemicals in these affect the formation and growth of the baby.  Schedule a dentist  appointment. At home, brush your teeth with a soft toothbrush and be gentle when you floss. SEEK MEDICAL CARE IF:   You have dizziness.  You have mild pelvic cramps, pelvic pressure, or nagging pain in the abdominal area.  You have persistent nausea, vomiting, or diarrhea.  You have a bad smelling vaginal discharge.  You have pain with urination.  You notice increased swelling in your face, hands, legs, or ankles. SEEK IMMEDIATE MEDICAL CARE IF:   You have a fever.  You are leaking fluid from your vagina.  You have spotting or bleeding from your vagina.  You have severe abdominal cramping or pain.  You have rapid weight gain or loss.  You vomit blood or material that looks like coffee grounds.  You are exposed to [place] measles and have never had them.  You are exposed to fifth disease or chickenpox.  You develop a severe headache.  You have shortness of breath.  You have any kind of trauma, such as from a fall or a car accident. Document Released: 01/09/2001 Document Revised: 06/01/2013 Document Reviewed: 11/25/2012 [facility] Patient Information 2015 [place], [place]. This information is not intended to replace advice given to you by your health care provider. Make sure you discuss any questions you have with your health care provider.

## 2017-03-22 ENCOUNTER — Encounter: Payer: Self-pay | Admitting: Women's Health

## 2017-03-22 ENCOUNTER — Other Ambulatory Visit: Payer: Self-pay | Admitting: Women's Health

## 2017-03-22 DIAGNOSIS — F1290 Cannabis use, unspecified, uncomplicated: Secondary | ICD-10-CM | POA: Insufficient documentation

## 2017-03-22 LAB — MICROSCOPIC EXAMINATION: CASTS: NONE SEEN /LPF

## 2017-03-22 LAB — OBSTETRIC PANEL, INCLUDING HIV
ANTIBODY SCREEN: NEGATIVE
BASOS: 0 %
Basophils Absolute: 0 10*3/uL (ref 0.0–0.2)
EOS (ABSOLUTE): 0.1 10*3/uL (ref 0.0–0.4)
EOS: 1 %
HEMATOCRIT: 34.7 % (ref 34.0–46.6)
HEMOGLOBIN: 10.7 g/dL — AB (ref 11.1–15.9)
HIV SCREEN 4TH GENERATION: NONREACTIVE
Hepatitis B Surface Ag: NEGATIVE
Immature Grans (Abs): 0 10*3/uL (ref 0.0–0.1)
Immature Granulocytes: 0 %
Lymphocytes Absolute: 2 10*3/uL (ref 0.7–3.1)
Lymphs: 26 %
MCH: 25.4 pg — ABNORMAL LOW (ref 26.6–33.0)
MCHC: 30.8 g/dL — AB (ref 31.5–35.7)
MCV: 82 fL (ref 79–97)
Monocytes Absolute: 0.6 10*3/uL (ref 0.1–0.9)
Monocytes: 7 %
NEUTROS ABS: 5 10*3/uL (ref 1.4–7.0)
Neutrophils: 66 %
Platelets: 310 10*3/uL (ref 150–379)
RBC: 4.21 x10E6/uL (ref 3.77–5.28)
RDW: 14.7 % (ref 12.3–15.4)
RH TYPE: POSITIVE
RPR Ser Ql: NONREACTIVE
RUBELLA: 9.87 {index} (ref >0.99)
WBC: 7.7 10*3/uL (ref 3.4–10.8)

## 2017-03-22 LAB — PMP SCREEN PROFILE (10S), URINE
AMPHETAMINE SCREEN URINE: NEGATIVE ng/mL
BARBITURATE SCREEN URINE: NEGATIVE ng/mL
BENZODIAZEPINE SCREEN, URINE: NEGATIVE ng/mL
CANNABINOIDS UR QL SCN: POSITIVE ng/mL — AB
CREATININE(CRT), U: 255.8 mg/dL (ref 20.0–300.0)
Cocaine (Metab) Scrn, Ur: NEGATIVE ng/mL
METHADONE SCREEN, URINE: NEGATIVE ng/mL
OXYCODONE+OXYMORPHONE UR QL SCN: NEGATIVE ng/mL
Opiate Scrn, Ur: NEGATIVE ng/mL
PH UR, DRUG SCRN: 6.2 (ref 4.5–8.9)
PHENCYCLIDINE QUANTITATIVE URINE: NEGATIVE ng/mL
PROPOXYPHENE SCREEN URINE: NEGATIVE ng/mL

## 2017-03-22 LAB — URINALYSIS, ROUTINE W REFLEX MICROSCOPIC
Bilirubin, UA: NEGATIVE
Glucose, UA: NEGATIVE
Nitrite, UA: NEGATIVE
PH UA: 6.5 (ref 5.0–7.5)
Protein, UA: NEGATIVE
RBC, UA: NEGATIVE
Urobilinogen, Ur: 1 mg/dL (ref 0.2–1.0)

## 2017-03-22 LAB — SICKLE CELL SCREEN: Sickle Cell Screen: NEGATIVE

## 2017-03-22 MED ORDER — FERROUS SULFATE 325 (65 FE) MG PO TABS: 325.0000 mg | ORAL_TABLET | Freq: Two times a day (BID) | ORAL | 3 refills | Status: DC

## 2017-03-23 LAB — URINE CULTURE

## 2017-03-25 LAB — CYTOLOGY - PAP
CHLAMYDIA, DNA PROBE: NEGATIVE
DIAGNOSIS: NEGATIVE
NEISSERIA GONORRHEA: NEGATIVE

## 2017-03-28 LAB — CYSTIC FIBROSIS MUTATION 97: GENE DIS ANAL CARRIER INTERP BLD/T-IMP: NOT DETECTED

## 2017-03-29 ENCOUNTER — Encounter: Payer: Self-pay | Admitting: Women's Health

## 2017-04-11 ENCOUNTER — Other Ambulatory Visit: Payer: Federal, State, Local not specified - PPO

## 2017-04-11 ENCOUNTER — Ambulatory Visit (INDEPENDENT_AMBULATORY_CARE_PROVIDER_SITE_OTHER): Payer: Federal, State, Local not specified - PPO

## 2017-04-11 ENCOUNTER — Encounter: Payer: Self-pay | Admitting: Women's Health

## 2017-04-11 ENCOUNTER — Ambulatory Visit (INDEPENDENT_AMBULATORY_CARE_PROVIDER_SITE_OTHER): Payer: Federal, State, Local not specified - PPO | Admitting: Women's Health

## 2017-04-11 VITALS — BP 110/80 | HR 96 | Wt 378.0 lb

## 2017-04-11 DIAGNOSIS — K219 Gastro-esophageal reflux disease without esophagitis: Secondary | ICD-10-CM

## 2017-04-11 DIAGNOSIS — Z3682 Encounter for antenatal screening for nuchal translucency: Secondary | ICD-10-CM | POA: Diagnosis not present

## 2017-04-11 DIAGNOSIS — O99611 Diseases of the digestive system complicating pregnancy, first trimester: Secondary | ICD-10-CM

## 2017-04-11 DIAGNOSIS — Z3401 Encounter for supervision of normal first pregnancy, first trimester: Secondary | ICD-10-CM

## 2017-04-11 DIAGNOSIS — Z331 Pregnant state, incidental: Secondary | ICD-10-CM

## 2017-04-11 DIAGNOSIS — Z1389 Encounter for screening for other disorder: Secondary | ICD-10-CM

## 2017-04-11 DIAGNOSIS — R7303 Prediabetes: Secondary | ICD-10-CM

## 2017-04-11 DIAGNOSIS — Z131 Encounter for screening for diabetes mellitus: Secondary | ICD-10-CM

## 2017-04-11 DIAGNOSIS — Z3402 Encounter for supervision of normal first pregnancy, second trimester: Secondary | ICD-10-CM

## 2017-04-11 DIAGNOSIS — Z3A13 13 weeks gestation of pregnancy: Secondary | ICD-10-CM

## 2017-04-11 DIAGNOSIS — Z1379 Encounter for other screening for genetic and chromosomal anomalies: Secondary | ICD-10-CM

## 2017-04-11 LAB — POCT URINALYSIS DIPSTICK
Blood, UA: NEGATIVE
GLUCOSE UA: NEGATIVE
Ketones, UA: NEGATIVE
LEUKOCYTES UA: NEGATIVE
Nitrite, UA: NEGATIVE
Protein, UA: NEGATIVE

## 2017-04-11 MED ORDER — PANTOPRAZOLE SODIUM 20 MG PO TBEC: 20.0000 mg | DELAYED_RELEASE_TABLET | Freq: Every day | ORAL | 6 refills | Status: DC

## 2017-04-11 NOTE — Progress Notes (Signed)
US 13 wks,measurements c/w dates,crl 71.20 mm,normal ovaries bilat,NB present,NT 1.7 mm,fhr 167 bpm,limited view because of pt body habitus

## 2017-04-11 NOTE — Patient Instructions (Addendum)
Anna [name], I greatly value your feedback.  If you receive a survey following your visit with us today, we appreciate you taking the time to fill it out.  Thanks, Joellyn HaffKim Hamdi Vari, CNM, WHNP-BC   Second Trimester of Pregnancy The second trimester is from week 14 through week 27 (months 4 through 6). The second trimester is often a time when you feel your best. Your body has adjusted to being pregnant, and you begin to feel better physically. Usually, morning sickness has lessened or quit completely, you may have more energy, and you may have an increase in appetite. The second trimester is also a time when the fetus is growing rapidly. At the end of the sixth month, the fetus is about 9 inches long and weighs about 1 pounds. You will likely begin to feel the baby move (quickening) between 16 and 20 weeks of pregnancy. Body changes during your second trimester Your body continues to go through many changes during your second trimester. The changes vary from woman to woman.  Your weight will continue to increase. You will notice your lower abdomen bulging out.  You may begin to get stretch marks on your hips, abdomen, and breasts.  You may develop headaches that can be relieved by medicines. The medicines should be approved by your health care provider.  You may urinate more often because the fetus is pressing on your bladder.  You may develop or continue to have heartburn as a result of your pregnancy.  You may develop constipation because certain hormones are causing the muscles that push waste through your intestines to slow down.  You may develop hemorrhoids or swollen, bulging veins (varicose veins).  You may have back pain. This is caused by: ? Weight gain. ? Pregnancy hormones that are relaxing the joints in your pelvis. ? A shift in weight and the muscles that support your balance.  Your breasts will continue to grow and they will continue to become tender.  Your gums may bleed  and may be sensitive to brushing and flossing.  Dark spots or blotches (chloasma, mask of pregnancy) may develop on your face. This will likely fade after the baby is born.  A dark line from your belly button to the pubic area (linea nigra) may appear. This will likely fade after the baby is born.  You may have changes in your hair. These can include thickening of your hair, rapid growth, and changes in texture. Some women also have hair loss during or after pregnancy, or hair that feels dry or thin. Your hair will most likely return to normal after your baby is born.  What to expect at prenatal visits During a routine prenatal visit:  You will be weighed to make sure you and the fetus are growing normally.  Your blood pressure will be taken.  Your abdomen will be measured to track your baby's growth.  The fetal heartbeat will be listened to.  Any test results from the previous visit will be discussed.  Your health care provider may ask you:  How you are feeling.  If you are feeling the baby move.  If you have had any abnormal symptoms, such as leaking fluid, bleeding, severe headaches, or abdominal cramping.  If you are using any tobacco products, including cigarettes, chewing tobacco, and electronic cigarettes.  If you have any questions.  Other tests that may be performed during your second trimester include:  Blood tests that check for: ? Low iron levels (anemia). ? High  blood sugar that affects pregnant women (gestational diabetes) between 3 and 28 weeks. ? Rh antibodies. This is to check for a protein on red blood cells (Rh factor).  Urine tests to check for infections, diabetes, or protein in the urine.  An ultrasound to confirm the proper growth and development of the baby.  An amniocentesis to check for possible genetic problems.  Fetal screens for spina bifida and Down syndrome.  HIV (human immunodeficiency virus) testing. Routine prenatal testing includes  screening for HIV, unless you choose not to have this test.  Follow these instructions at home: Medicines  Follow your health care provider's instructions regarding medicine use. Specific medicines may be either safe or unsafe to take during pregnancy.  Take a prenatal vitamin that contains at least 600 micrograms (mcg) of folic acid.  If you develop constipation, try taking a stool softener if your health care provider approves. Eating and drinking  Eat a balanced diet that includes fresh fruits and vegetables, whole grains, good sources of protein such as meat, eggs, or tofu, and low-fat dairy. Your health care provider will help you determine the amount of weight gain that is right for you.  Avoid raw meat and uncooked cheese. These carry germs that can cause birth defects in the baby.  If you have low calcium intake from food, talk to your health care provider about whether you should take a daily calcium supplement.  Limit foods that are high in fat and processed sugars, such as fried and sweet foods.  To prevent constipation: ? Drink enough fluid to keep your urine clear or pale yellow. ? Eat foods that are high in fiber, such as fresh fruits and vegetables, whole grains, and beans. Activity  Exercise only as directed by your health care provider. Most women can continue their usual exercise routine during pregnancy. Try to exercise for 30 minutes at least 5 days a week. Stop exercising if you experience uterine contractions.  Avoid heavy lifting, wear low heel shoes, and practice good posture.  A sexual relationship may be continued unless your health care provider directs you otherwise. Relieving pain and discomfort  Wear a good support bra to prevent discomfort from breast tenderness.  Take warm sitz baths to soothe any pain or discomfort caused by hemorrhoids. Use hemorrhoid cream if your health care provider approves.  Rest with your legs elevated if you have leg cramps  or low back pain.  If you develop varicose veins, wear support hose. Elevate your feet for 15 minutes, 3-4 times a day. Limit salt in your diet. Prenatal Care  Write down your questions. Take them to your prenatal visits.  Keep all your prenatal visits as told by your health care provider. This is important. Safety  Wear your seat belt at all times when driving.  Make a list of emergency phone numbers, including numbers for family, friends, the hospital, and police and fire departments. General instructions  Ask your health care provider for a referral to a local prenatal education class. Begin classes no later than the beginning of month 6 of your pregnancy.  Ask for help if you have counseling or nutritional needs during pregnancy. Your health care provider can offer advice or refer you to specialists for help with various needs.  Do not use hot tubs, steam rooms, or saunas.  Do not douche or use tampons or scented sanitary pads.  Do not cross your legs for long periods of time.  Avoid cat litter boxes and soil  used by cats. These carry germs that can cause birth defects in the baby and possibly loss of the fetus by miscarriage or stillbirth.  Avoid all smoking, herbs, alcohol, and unprescribed drugs. Chemicals in these products can affect the formation and growth of the baby.  Do not use any products that contain nicotine or tobacco, such as cigarettes and e-cigarettes. If you need help quitting, ask your health care provider.  Visit your dentist if you have not gone yet during your pregnancy. Use a soft toothbrush to brush your teeth and be gentle when you floss. Contact a health care provider if:  You have dizziness.  You have mild pelvic cramps, pelvic pressure, or nagging pain in the abdominal area.  You have persistent nausea, vomiting, or diarrhea.  You have a bad smelling vaginal discharge.  You have pain when you urinate. Get help right away if:  You have a  fever.  You are leaking fluid from your vagina.  You have spotting or bleeding from your vagina.  You have severe abdominal cramping or pain.  You have rapid weight gain or weight loss.  You have shortness of breath with chest pain.  You notice sudden or extreme swelling of your face, hands, ankles, feet, or legs.  You have not felt your baby move in over an hour.  You have severe headaches that do not go away when you take medicine.  You have vision changes. Summary  The second trimester is from week 14 through week 27 (months 4 through 6). It is also a time when the fetus is growing rapidly.  Your body goes through many changes during pregnancy. The changes vary from woman to woman.  Avoid all smoking, herbs, alcohol, and unprescribed drugs. These chemicals affect the formation and growth your baby.  Do not use any tobacco products, such as cigarettes, chewing tobacco, and e-cigarettes. If you need help quitting, ask your health care provider.  Contact your health care provider if you have any questions. Keep all prenatal visits as told by your health care provider. This is important. This information is not intended to replace advice given to you by your health care provider. Make sure you discuss any questions you have with your health care provider. Document Released: 01/09/2001 Document Revised: 06/23/2015 Document Reviewed: 03/18/2012 Elsevier Interactive Patient Education  2017 [employer]   Heartburn During Pregnancy Heartburn is a type of pain or discomfort that can happen in the throat or chest. It is often described as a burning sensation. Heartburn is common during pregnancy because:  A hormone (progesterone) that is released during pregnancy may relax the valve (lower esophageal sphincter, or LES) that separates the esophagus from the stomach. This allows stomach acid to move up into the esophagus, causing heartburn.  The uterus gets larger and pushes up on  the stomach, which pushes more acid into the esophagus. This is especially true in the later stages of pregnancy.  Heartburn usually goes away or gets better after giving birth. What are the causes? Heartburn is caused by stomach acid backing up into the esophagus (reflux). Reflux can be triggered by:  Changing hormone levels.  Large meals.  Certain foods and beverages, such as coffee, chocolate, onions, and peppermint.  Exercise.  Increased stomach acid production.  What increases the risk? You are more likely to experience heartburn during pregnancy if you:  Had heartburn prior to becoming pregnant.  Have been pregnant more than once before.  Are overweight or obese.  The  likelihood that you will get heartburn also increases as you get farther along in your pregnancy, especially during the last trimester. What are the signs or symptoms? Symptoms of this condition include:  Burning pain in the chest or lower throat.  Bitter taste in the mouth.  Coughing.  Problems swallowing.  Vomiting.  Hoarse voice.  Asthma.  Symptoms may get worse when you lie down or bend over. Symptoms are often worse at night. How is this diagnosed? This condition is diagnosed based on:  Your medical history.  Your symptoms.  Blood tests to check for a certain type of bacteria associated with heartburn.  Whether taking heartburn medicine relieves your symptoms.  Examination of the stomach and esophagus using a tube with a light and camera on the end (endoscopy).  How is this treated? Treatment varies depending on how severe your symptoms are. Your health care provider may recommend:  Over-the-counter medicines (antacids or acid reducers) for mild heartburn.  Prescription medicines to decrease stomach acid or to protect your stomach lining.  Certain changes in your diet.  Raising the head of your bed so it is higher than the foot of the bed. This helps prevent stomach acid from  backing up into the esophagus when you are lying down.  Follow these instructions at home: Eating and drinking  Do not drink alcohol during your pregnancy.  Identify foods and beverages that make your symptoms worse, and avoid them.  Beverages that you may want to avoid include: ? Coffee and tea (with or without caffeine). ? Energy drinks and sports drinks. ? Carbonated drinks or sodas. ? Citrus fruit juices.  Foods that you may want to avoid include: ? Chocolate and cocoa. ? Peppermint and mint flavorings. ? Garlic, onions, and horseradish. ? Spicy and acidic foods, including peppers, chili powder, curry powder, vinegar, hot sauces, and barbecue sauce. ? Citrus fruits, such as oranges, lemons, and limes. ? Tomato-based foods, such as red sauce, chili, and salsa. ? Fried and fatty foods, such as donuts, french fries, potato chips, and high-fat dressings. ? High-fat meats, such as hot dogs, cold cuts, sausage, ham, and bacon. ? High-fat dairy items, such as whole milk, butter, and cheese.  Eat small, frequent meals instead of large meals.  Avoid drinking large amounts of liquid with your meals.  Avoid eating meals during the 2-3 hours before bedtime.  Avoid lying down right after you eat.  Do not exercise right after you eat. Medicines  Take over-the-counter and prescription medicines only as told by your health care provider.  Do not take aspirin, ibuprofen, or other NSAIDs unless your health care provider tells you to do that.  You may be instructed to avoid medicines that contain sodium bicarbonate. General instructions  If directed, raise the head of your bed about 6 inches (15 cm) by putting blocks under the legs. Sleeping with more pillows does not effectively relieve heartburn because it only changes the position of your head.  Do not use any products that contain nicotine or tobacco, such as cigarettes and e-cigarettes. If you need help quitting, ask your health  care provider.  Wear loose-fitting clothing.  Try to reduce your stress, such as with yoga or meditation. If you need help managing stress, ask your health care provider.  Maintain a healthy weight. If you are overweight, work with your health care provider to safely lose weight.  Keep all follow-up visits as told by your health care provider. This is important. Contact a health  care provider if:  You develop new symptoms.  Your symptoms do not improve with treatment.  You have unexplained weight loss.  You have difficulty swallowing.  You make loud sounds when you breathe (wheeze).  You have a cough that does not go away.  You have frequent heartburn for more than 2 weeks.  You have nausea or vomiting that does not get better with treatment.  You have pain in your abdomen. Get help right away if:  You have severe chest pain that spreads to your arm, neck, or jaw.  You feel sweaty, dizzy, or light-headed.  You have shortness of breath.  You have pain when swallowing.  You vomit, and your vomit looks like blood or coffee grounds.  Your stool is bloody or black. This information is not intended to replace advice given to you by your health care provider. Make sure you discuss any questions you have with your health care provider. Document Released: 01/13/2000 Document Revised: 10/03/2015 Document Reviewed: 10/03/2015 Elsevier Interactive Patient Education  2018 [employer].

## 2017-04-11 NOTE — Progress Notes (Signed)
   LOW-RISK PREGNANCY VISIT Patient name: Anna Sleightanautica S Spray MRN [id number]  Date of birth: 1995/06/24 Chief Complaint:   Routine Prenatal Visit (NT/IT/  2 hr gtt)  History of Present Illness:   Anna [name] is a 22 y.o. G1P0 female at 4029w0d with an Estimated Date of Delivery: 10/17/17 being seen today for ongoing management of a low-risk pregnancy.  Today she reports reflux, TUMS not helping. Doing early sugar test today d/t BMI, and h/o pre-diabetes  .  Marland Kitchen.  Movement: Absent. denies leaking of fluid. Review of Systems:   Pertinent items are noted in HPI Denies abnormal vaginal discharge w/ itching/odor/irritation, headaches, visual changes, shortness of breath, chest pain, abdominal pain, severe nausea/vomiting, or problems with urination or bowel movements unless otherwise stated above. Pertinent History Reviewed:  Reviewed past medical,surgical, social, obstetrical and family history.  Reviewed problem list, medications and allergies. Physical Assessment:   Vitals:   04/11/17 1035  BP: 110/80  Pulse: 96  Weight: (!) 378 lb (171.5 kg)  Body mass index is 69.14 kg/m.        Physical Examination:   General appearance: Well appearing, and in no distress  Mental status: Alert, oriented to person, place, and time  Skin: Warm & dry  Cardiovascular: Normal heart rate noted  Respiratory: Normal respiratory effort, no distress  Abdomen: Soft, gravid, nontender  Pelvic: Cervical exam deferred         Extremities: Edema: None  Fetal Status:     Movement: Absent    US 13 wks,measurements c/w dates,crl 71.20 mm,normal ovaries bilat,NB present,NT 1.7 mm,fhr 167 bpm,limited view because of pt body habitus  Results for orders placed or performed in visit on 04/11/17 (from the past 24 hour(s))  POCT urinalysis dipstick   Collection Time: 04/11/17 10:45 AM  Result Value Ref Range   Color, UA     Clarity, UA     Glucose, UA neg    Bilirubin, UA     Ketones, UA neg    Spec Grav, UA   1.010 - 1.025   Blood, UA neg    pH, UA  5.0 - 8.0   Protein, UA neg    Urobilinogen, UA  0.2 or 1.0 E.U./dL   Nitrite, UA neg    Leukocytes, UA Negative Negative   Appearance     Odor      Assessment & Plan:  1) Low-risk pregnancy G1P0 at 3829w0d with an Estimated Date of Delivery: 10/17/17   2) Reflux, rx protonix, info given  3) Pre-diabetes, obesity> early GTT today   Meds:  Meds ordered this encounter  Medications  . pantoprazole (PROTONIX) 20 MG tablet    Sig: Take 1 tablet (20 mg total) by mouth daily.    Dispense:  30 tablet    Refill:  6    Order Specific Question:   Supervising Provider    Answer:   Lazaro ArmsEURE, LUTHER H [[id number]]   Labs/procedures today: 1st IT, GTT  Plan:  Continue routine obstetrical care   Reviewed: Preterm labor symptoms and general obstetric precautions including but not limited to vaginal bleeding, contractions, leaking of fluid and fetal movement were reviewed in detail with the patient.  All questions were answered  Follow-up: Return in about 4 weeks (around 05/09/2017) for LROB, 2nd IT.  Orders Placed This Encounter  Procedures  . Integrated 1  . POCT urinalysis dipstick   Cheral MarkerKimberly R Reniyah Gootee CNM, [facility]NP-BC 04/11/2017 11:18 AM

## 2017-04-12 LAB — GLUCOSE TOLERANCE, 2 HOURS W/ 1HR
Glucose, 1 hour: 133 mg/dL (ref 65–179)
Glucose, 2 hour: 126 mg/dL (ref 65–152)
Glucose, Fasting: 90 mg/dL (ref 65–91)

## 2017-04-16 LAB — INTEGRATED 1
CROWN RUMP LENGTH MAT SCREEN: 71.2 mm
Gest. Age on Collection Date: 13.1 weeks
Maternal Age at EDD: 22.2 yr
Nuchal Translucency (NT): 1.7 mm
Number of Fetuses: 1
PAPP-A Value: 580.8 ng/mL
WEIGHT: 378 [lb_av]

## 2017-04-18 ENCOUNTER — Encounter: Payer: Self-pay | Admitting: Women's Health

## 2017-04-29 ENCOUNTER — Telehealth: Payer: Self-pay | Admitting: *Deleted

## 2017-04-29 NOTE — Telephone Encounter (Signed)
Patient states she has had a sore throat since last night and wants to know what she can take.  Informed patient she could try throat lozenges, chloraseptic spray or Claritin or Zyrtec if she has problems with seasonal allergies.  Patient states she does and has not been taking anything.  Also asked if she could take Benadryl. Advised she could.

## 2017-05-09 ENCOUNTER — Encounter: Payer: Federal, State, Local not specified - PPO | Admitting: Women's Health

## 2017-05-10 ENCOUNTER — Ambulatory Visit (INDEPENDENT_AMBULATORY_CARE_PROVIDER_SITE_OTHER): Payer: Federal, State, Local not specified - PPO | Admitting: Women's Health

## 2017-05-10 ENCOUNTER — Encounter: Payer: Self-pay | Admitting: Women's Health

## 2017-05-10 VITALS — BP 100/80 | HR 92 | Wt 378.6 lb

## 2017-05-10 DIAGNOSIS — Z363 Encounter for antenatal screening for malformations: Secondary | ICD-10-CM

## 2017-05-10 DIAGNOSIS — Z3402 Encounter for supervision of normal first pregnancy, second trimester: Secondary | ICD-10-CM

## 2017-05-10 DIAGNOSIS — Z331 Pregnant state, incidental: Secondary | ICD-10-CM

## 2017-05-10 DIAGNOSIS — Z1389 Encounter for screening for other disorder: Secondary | ICD-10-CM

## 2017-05-10 DIAGNOSIS — Z1379 Encounter for other screening for genetic and chromosomal anomalies: Secondary | ICD-10-CM

## 2017-05-10 DIAGNOSIS — Z3A17 17 weeks gestation of pregnancy: Secondary | ICD-10-CM

## 2017-05-10 LAB — POCT URINALYSIS DIPSTICK
Glucose, UA: NEGATIVE
Nitrite, UA: NEGATIVE
Protein, UA: NEGATIVE
RBC UA: NEGATIVE

## 2017-05-10 NOTE — Patient Instructions (Signed)
Anna [name], I greatly value your feedback.  If you receive a survey following your visit with us today, we appreciate you taking the time to fill it out.  Thanks, Joellyn HaffKim Numa Schroeter, CNM, WHNP-BC   Second Trimester of Pregnancy The second trimester is from week 14 through week 27 (months 4 through 6). The second trimester is often a time when you feel your best. Your body has adjusted to being pregnant, and you begin to feel better physically. Usually, morning sickness has lessened or quit completely, you may have more energy, and you may have an increase in appetite. The second trimester is also a time when the fetus is growing rapidly. At the end of the sixth month, the fetus is about 9 inches long and weighs about 1 pounds. You will likely begin to feel the baby move (quickening) between 16 and 20 weeks of pregnancy. Body changes during your second trimester Your body continues to go through many changes during your second trimester. The changes vary from woman to woman.  Your weight will continue to increase. You will notice your lower abdomen bulging out.  You may begin to get stretch marks on your hips, abdomen, and breasts.  You may develop headaches that can be relieved by medicines. The medicines should be approved by your health care provider.  You may urinate more often because the fetus is pressing on your bladder.  You may develop or continue to have heartburn as a result of your pregnancy.  You may develop constipation because certain hormones are causing the muscles that push waste through your intestines to slow down.  You may develop hemorrhoids or swollen, bulging veins (varicose veins).  You may have back pain. This is caused by: ? Weight gain. ? Pregnancy hormones that are relaxing the joints in your pelvis. ? A shift in weight and the muscles that support your balance.  Your breasts will continue to grow and they will continue to become tender.  Your gums may bleed  and may be sensitive to brushing and flossing.  Dark spots or blotches (chloasma, mask of pregnancy) may develop on your face. This will likely fade after the baby is born.  A dark line from your belly button to the pubic area (linea nigra) may appear. This will likely fade after the baby is born.  You may have changes in your hair. These can include thickening of your hair, rapid growth, and changes in texture. Some women also have hair loss during or after pregnancy, or hair that feels dry or thin. Your hair will most likely return to normal after your baby is born.  What to expect at prenatal visits During a routine prenatal visit:  You will be weighed to make sure you and the fetus are growing normally.  Your blood pressure will be taken.  Your abdomen will be measured to track your baby's growth.  The fetal heartbeat will be listened to.  Any test results from the previous visit will be discussed.  Your health care provider may ask you:  How you are feeling.  If you are feeling the baby move.  If you have had any abnormal symptoms, such as leaking fluid, bleeding, severe headaches, or abdominal cramping.  If you are using any tobacco products, including cigarettes, chewing tobacco, and electronic cigarettes.  If you have any questions.  Other tests that may be performed during your second trimester include:  Blood tests that check for: ? Low iron levels (anemia). ? High  blood sugar that affects pregnant women (gestational diabetes) between 3 and 28 weeks. ? Rh antibodies. This is to check for a protein on red blood cells (Rh factor).  Urine tests to check for infections, diabetes, or protein in the urine.  An ultrasound to confirm the proper growth and development of the baby.  An amniocentesis to check for possible genetic problems.  Fetal screens for spina bifida and Down syndrome.  HIV (human immunodeficiency virus) testing. Routine prenatal testing includes  screening for HIV, unless you choose not to have this test.  Follow these instructions at home: Medicines  Follow your health care provider's instructions regarding medicine use. Specific medicines may be either safe or unsafe to take during pregnancy.  Take a prenatal vitamin that contains at least 600 micrograms (mcg) of folic acid.  If you develop constipation, try taking a stool softener if your health care provider approves. Eating and drinking  Eat a balanced diet that includes fresh fruits and vegetables, whole grains, good sources of protein such as meat, eggs, or tofu, and low-fat dairy. Your health care provider will help you determine the amount of weight gain that is right for you.  Avoid raw meat and uncooked cheese. These carry germs that can cause birth defects in the baby.  If you have low calcium intake from food, talk to your health care provider about whether you should take a daily calcium supplement.  Limit foods that are high in fat and processed sugars, such as fried and sweet foods.  To prevent constipation: ? Drink enough fluid to keep your urine clear or pale yellow. ? Eat foods that are high in fiber, such as fresh fruits and vegetables, whole grains, and beans. Activity  Exercise only as directed by your health care provider. Most women can continue their usual exercise routine during pregnancy. Try to exercise for 30 minutes at least 5 days a week. Stop exercising if you experience uterine contractions.  Avoid heavy lifting, wear low heel shoes, and practice good posture.  A sexual relationship may be continued unless your health care provider directs you otherwise. Relieving pain and discomfort  Wear a good support bra to prevent discomfort from breast tenderness.  Take warm sitz baths to soothe any pain or discomfort caused by hemorrhoids. Use hemorrhoid cream if your health care provider approves.  Rest with your legs elevated if you have leg cramps  or low back pain.  If you develop varicose veins, wear support hose. Elevate your feet for 15 minutes, 3-4 times a day. Limit salt in your diet. Prenatal Care  Write down your questions. Take them to your prenatal visits.  Keep all your prenatal visits as told by your health care provider. This is important. Safety  Wear your seat belt at all times when driving.  Make a list of emergency phone numbers, including numbers for family, friends, the hospital, and police and fire departments. General instructions  Ask your health care provider for a referral to a local prenatal education class. Begin classes no later than the beginning of month 6 of your pregnancy.  Ask for help if you have counseling or nutritional needs during pregnancy. Your health care provider can offer advice or refer you to specialists for help with various needs.  Do not use hot tubs, steam rooms, or saunas.  Do not douche or use tampons or scented sanitary pads.  Do not cross your legs for long periods of time.  Avoid cat litter boxes and soil  used by cats. These carry germs that can cause birth defects in the baby and possibly loss of the fetus by miscarriage or stillbirth.  Avoid all smoking, herbs, alcohol, and unprescribed drugs. Chemicals in these products can affect the formation and growth of the baby.  Do not use any products that contain nicotine or tobacco, such as cigarettes and e-cigarettes. If you need help quitting, ask your health care provider.  Visit your dentist if you have not gone yet during your pregnancy. Use a soft toothbrush to brush your teeth and be gentle when you floss. Contact a health care provider if:  You have dizziness.  You have mild pelvic cramps, pelvic pressure, or nagging pain in the abdominal area.  You have persistent nausea, vomiting, or diarrhea.  You have a bad smelling vaginal discharge.  You have pain when you urinate. Get help right away if:  You have a  fever.  You are leaking fluid from your vagina.  You have spotting or bleeding from your vagina.  You have severe abdominal cramping or pain.  You have rapid weight gain or weight loss.  You have shortness of breath with chest pain.  You notice sudden or extreme swelling of your face, hands, ankles, feet, or legs.  You have not felt your baby move in over an hour.  You have severe headaches that do not go away when you take medicine.  You have vision changes. Summary  The second trimester is from week 14 through week 27 (months 4 through 6). It is also a time when the fetus is growing rapidly.  Your body goes through many changes during pregnancy. The changes vary from woman to woman.  Avoid all smoking, herbs, alcohol, and unprescribed drugs. These chemicals affect the formation and growth your baby.  Do not use any tobacco products, such as cigarettes, chewing tobacco, and e-cigarettes. If you need help quitting, ask your health care provider.  Contact your health care provider if you have any questions. Keep all prenatal visits as told by your health care provider. This is important. This information is not intended to replace advice given to you by your health care provider. Make sure you discuss any questions you have with your health care provider. Document Released: 01/09/2001 Document Revised: 06/23/2015 Document Reviewed: 03/18/2012 Elsevier Interactive Patient Education  2017 [employer].

## 2017-05-10 NOTE — Progress Notes (Signed)
   LOW-RISK PREGNANCY VISIT Patient name: Anna [name] MRN [id number]  Date of birth: Sep 15, 1995 Chief Complaint:   Routine Prenatal Visit (2 nd IT)  History of Present Illness:   Anna [name] is a 22 y.o. G1P0 female at 6442w1d with an Estimated Date of Delivery: 10/17/17 being seen today for ongoing management of a low-risk pregnancy.  Today she reports no complaints and went to hospital, dx w/ strep throat, has finished antibiotics. Does have some allergies, zyrtec not helping much.  .  .  Movement: Absent. denies leaking of fluid. Review of Systems:   Pertinent items are noted in HPI Denies abnormal vaginal discharge w/ itching/odor/irritation, headaches, visual changes, shortness of breath, chest pain, abdominal pain, severe nausea/vomiting, or problems with urination or bowel movements unless otherwise stated above. Pertinent History Reviewed:  Reviewed past medical,surgical, social, obstetrical and family history.  Reviewed problem list, medications and allergies. Physical Assessment:   Vitals:   05/10/17 1214  BP: 100/80  Pulse: 92  Weight: (!) 378 lb 9.6 oz (171.7 kg)  Body mass index is 69.25 kg/m.        Physical Examination:   General appearance: Well appearing, and in no distress  Mental status: Alert, oriented to person, place, and time  Skin: Warm & dry  Cardiovascular: Normal heart rate noted  Respiratory: Normal respiratory effort, no distress  Abdomen: Soft, gravid, nontender  Pelvic: Cervical exam deferred         Extremities: Edema: None  Fetal Status: Fetal Heart Rate (bpm): +u/s informal transabdominal u/s   Movement: Absent      Results for orders placed or performed in visit on 05/10/17 (from the past 24 hour(s))  POCT urinalysis dipstick   Collection Time: 05/10/17 12:21 PM  Result Value Ref Range   Color, UA     Clarity, UA     Glucose, UA neg    Bilirubin, UA     Ketones, UA small    Spec Grav, UA  1.010 - 1.025   Blood, UA neg    pH,  UA  5.0 - 8.0   Protein, UA neg    Urobilinogen, UA  0.2 or 1.0 E.U./dL   Nitrite, UA neg    Leukocytes, UA Moderate (2+) (A) Negative   Appearance     Odor      Assessment & Plan:  1) Low-risk pregnancy G1P0 at 5742w1d with an Estimated Date of Delivery: 10/17/17   2) Allergies, can try claritin, flonase   Meds: No orders of the defined types were placed in this encounter.  Labs/procedures today: 2nd IT  Plan:  Continue routine obstetrical care   Reviewed: Preterm labor symptoms and general obstetric precautions including but not limited to vaginal bleeding, contractions, leaking of fluid and fetal movement were reviewed in detail with the patient.  All questions were answered  Follow-up: Return in about 3 weeks (around 05/31/2017) for LROB, NW:GNFAOZHS:Anatomy.  Orders Placed This Encounter  Procedures  . US OB Comp + 14 Wk  . INTEGRATED 2  . POCT urinalysis dipstick   Cheral MarkerKimberly R Akeema Broder CNM, [facility]NP-BC 05/10/2017 12:51 PM

## 2017-05-14 LAB — INTEGRATED 2
ADSF: 0.7
AFP MoM: 1.26
Alpha-Fetoprotein: 18.6 ng/mL
CROWN RUMP LENGTH: 71.2 mm
DIA MoM: 0.96
DIA VALUE: 99.9 pg/mL
Estriol, Unconjugated: 0.5 ng/mL
GEST. AGE ON COLLECTION DATE: 13.1 wk
Gestational Age: 17.3 weeks
HCG VALUE: 11.9 [IU]/mL
MATERNAL AGE AT EDD: 22.2 a
NUCHAL TRANSLUCENCY (NT): 1.7 mm
Nuchal Translucency MoM: 1.05
Number of Fetuses: 1
PAPP-A MOM: 1.62
PAPP-A VALUE: 580.8 ng/mL
TEST RESULTS: NEGATIVE
WEIGHT: 378 [lb_av]
Weight: 378 [lb_av]
hCG MoM: 1.09

## 2017-05-16 ENCOUNTER — Telehealth: Payer: Self-pay | Admitting: *Deleted

## 2017-05-18 MED ORDER — PRENATAL ADULT GUMMY/DHA/FA 0.4-25 MG PO CHEW: 1.0000 | CHEWABLE_TABLET | Freq: Every day | ORAL | 11 refills | Status: DC

## 2017-05-22 NOTE — Telephone Encounter (Signed)
Attempted to call pt to let her know that Rx for Prenatal gummies had been sent.

## 2017-05-30 ENCOUNTER — Encounter: Payer: Self-pay | Admitting: Women's Health

## 2017-05-30 ENCOUNTER — Other Ambulatory Visit: Payer: Self-pay

## 2017-05-30 ENCOUNTER — Ambulatory Visit (INDEPENDENT_AMBULATORY_CARE_PROVIDER_SITE_OTHER): Payer: Federal, State, Local not specified - PPO | Admitting: Women's Health

## 2017-05-30 ENCOUNTER — Ambulatory Visit (INDEPENDENT_AMBULATORY_CARE_PROVIDER_SITE_OTHER): Payer: Federal, State, Local not specified - PPO

## 2017-05-30 VITALS — BP 114/72 | HR 97 | Wt 374.0 lb

## 2017-05-30 DIAGNOSIS — Z3A20 20 weeks gestation of pregnancy: Secondary | ICD-10-CM

## 2017-05-30 DIAGNOSIS — Z3402 Encounter for supervision of normal first pregnancy, second trimester: Secondary | ICD-10-CM

## 2017-05-30 DIAGNOSIS — Z331 Pregnant state, incidental: Secondary | ICD-10-CM

## 2017-05-30 DIAGNOSIS — Z363 Encounter for antenatal screening for malformations: Secondary | ICD-10-CM | POA: Diagnosis not present

## 2017-05-30 DIAGNOSIS — Z1389 Encounter for screening for other disorder: Secondary | ICD-10-CM

## 2017-05-30 LAB — POCT URINALYSIS DIPSTICK
GLUCOSE UA: NEGATIVE
Ketones, UA: NEGATIVE
Nitrite, UA: NEGATIVE
Protein, UA: NEGATIVE
RBC UA: NEGATIVE

## 2017-05-30 NOTE — Progress Notes (Signed)
[place] 20 wks,breech,cx 3.8 cm,posterior pl gr 0,svp of fluid 5.4 cm,fhr 159 bpm,efw 327 g 47%,anatomy complete,no obvious abnormalities

## 2017-05-30 NOTE — Progress Notes (Signed)
   LOW-RISK PREGNANCY VISIT Patient name: Anna [name] MRN [id number]  Date of birth: [birth year] Chief Complaint:   Routine Prenatal Visit (u/s today)  History of Present Illness:   Anna [name] is a 22 y.o. G1P0 female at [redacted]w[redacted]d with an Estimated Date of Delivery: 10/17/17 being seen today for ongoing management of a low-risk pregnancy.  Today she reports no complaints.  . Vag. Bleeding: None.  Movement: Present. denies leaking of fluid. Review of Systems:   Pertinent items are noted in HPI Denies abnormal vaginal discharge w/ itching/odor/irritation, headaches, visual changes, shortness of breath, chest pain, abdominal pain, severe nausea/vomiting, or problems with urination or bowel movements unless otherwise stated above. Pertinent History Reviewed:  Reviewed past medical,surgical, social, obstetrical and family history.  Reviewed problem list, medications and allergies. Physical Assessment:   Vitals:   05/30/17 1352  BP: 114/72  Pulse: 97  Weight: (!) 374 lb (169.6 kg)  Body mass index is 68.41 kg/m.        Physical Examination:   General appearance: Well appearing, and in no distress  Mental status: Alert, oriented to person, place, and time  Skin: Warm & dry  Cardiovascular: Normal heart rate noted  Respiratory: Normal respiratory effort, no distress  Abdomen: Soft, gravid, nontender  Pelvic: Cervical exam deferred         Extremities: Edema: None  Fetal Status: Fetal Heart Rate (bpm): 159 u/s   Movement: Present    Results for orders placed or performed in visit on 05/30/17 (from the past 24 hour(s))  POCT urinalysis dipstick   Collection Time: 05/30/17  1:53 PM  Result Value Ref Range   Color, UA     Clarity, UA     Glucose, UA neg    Bilirubin, UA     Ketones, UA neg    Spec Grav, UA  1.010 - 1.025   Blood, UA neg    pH, UA  5.0 - 8.0   Protein, UA neg    Urobilinogen, UA  0.2 or 1.0 E.U./dL   Nitrite, UA neg    Leukocytes, UA Moderate (2+) (A)  Negative   Appearance     Odor      Assessment & Plan:  1) Low-risk pregnancy G1P0 at [redacted]w[redacted]d with an Estimated Date of Delivery: 10/17/17    Meds: No orders of the defined types were placed in this encounter.  Labs/procedures today: anatomy u/s  Plan:  Continue routine obstetrical care   Reviewed: Preterm labor symptoms and general obstetric precautions including but not limited to vaginal bleeding, contractions, leaking of fluid and fetal movement were reviewed in detail with the patient.  All questions were answered  Follow-up: Return in about 1 month (around 06/27/2017) for LROB.  Orders Placed This Encounter  Procedures  . POCT urinalysis dipstick   Cheral Marker CNM, [facility] 05/30/2017 2:24 PM

## 2017-05-30 NOTE — Patient Instructions (Signed)
Anna [name], I greatly value your feedback.  If you receive a survey following your visit with [place] today, we appreciate you taking the time to fill it out.  Thanks, Joellyn Haff, CNM, WHNP-BC   Second Trimester of Pregnancy The second trimester is from week 14 through week 27 (months 4 through 6). The second trimester is often a time when you feel your best. Your body has adjusted to being pregnant, and you begin to feel better physically. Usually, morning sickness has lessened or quit completely, you may have more energy, and you may have an increase in appetite. The second trimester is also a time when the fetus is growing rapidly. At the end of the sixth month, the fetus is about 9 inches long and weighs about 1 pounds. You will likely begin to feel the baby move (quickening) between 16 and 20 weeks of pregnancy. Body changes during your second trimester Your body continues to go through many changes during your second trimester. The changes vary from woman to woman.  Your weight will continue to increase. You will notice your lower abdomen bulging out.  You may begin to get stretch marks on your hips, abdomen, and breasts.  You may develop headaches that can be relieved by medicines. The medicines should be approved by your health care provider.  You may urinate more often because the fetus is pressing on your bladder.  You may develop or continue to have heartburn as a result of your pregnancy.  You may develop constipation because certain hormones are causing the muscles that push waste through your intestines to slow down.  You may develop hemorrhoids or swollen, bulging veins (varicose veins).  You may have back pain. This is caused by: ? Weight gain. ? Pregnancy hormones that are relaxing the joints in your pelvis. ? A shift in weight and the muscles that support your balance.  Your breasts will continue to grow and they will continue to become tender.  Your gums may bleed  and may be sensitive to brushing and flossing.  Dark spots or blotches (chloasma, mask of pregnancy) may develop on your face. This will likely fade after the baby is born.  A dark line from your belly button to the pubic area (linea nigra) may appear. This will likely fade after the baby is born.  You may have changes in your hair. These can include thickening of your hair, rapid growth, and changes in texture. Some women also have hair loss during or after pregnancy, or hair that feels dry or thin. Your hair will most likely return to normal after your baby is born.  What to expect at prenatal visits During a routine prenatal visit:  You will be weighed to make sure you and the fetus are growing normally.  Your blood pressure will be taken.  Your abdomen will be measured to track your baby's growth.  The fetal heartbeat will be listened to.  Any test results from the previous visit will be discussed.  Your health care provider may ask you:  How you are feeling.  If you are feeling the baby move.  If you have had any abnormal symptoms, such as leaking fluid, bleeding, severe headaches, or abdominal cramping.  If you are using any tobacco products, including cigarettes, chewing tobacco, and electronic cigarettes.  If you have any questions.  Other tests that may be performed during your second trimester include:  Blood tests that check for: ? Low iron levels (anemia). ? High  blood sugar that affects pregnant women (gestational diabetes) between 3 and 28 weeks. ? Rh antibodies. This is to check for a protein on red blood cells (Rh factor).  Urine tests to check for infections, diabetes, or protein in the urine.  An ultrasound to confirm the proper growth and development of the baby.  An amniocentesis to check for possible genetic problems.  Fetal screens for spina bifida and Down syndrome.  HIV (human immunodeficiency virus) testing. Routine prenatal testing includes  screening for HIV, unless you choose not to have this test.  Follow these instructions at home: Medicines  Follow your health care provider's instructions regarding medicine use. Specific medicines may be either safe or unsafe to take during pregnancy.  Take a prenatal vitamin that contains at least 600 micrograms (mcg) of folic acid.  If you develop constipation, try taking a stool softener if your health care provider approves. Eating and drinking  Eat a balanced diet that includes fresh fruits and vegetables, whole grains, good sources of protein such as meat, eggs, or tofu, and low-fat dairy. Your health care provider will help you determine the amount of weight gain that is right for you.  Avoid raw meat and uncooked cheese. These carry germs that can cause birth defects in the baby.  If you have low calcium intake from food, talk to your health care provider about whether you should take a daily calcium supplement.  Limit foods that are high in fat and processed sugars, such as fried and sweet foods.  To prevent constipation: ? Drink enough fluid to keep your urine clear or pale yellow. ? Eat foods that are high in fiber, such as fresh fruits and vegetables, whole grains, and beans. Activity  Exercise only as directed by your health care provider. Most women can continue their usual exercise routine during pregnancy. Try to exercise for 30 minutes at least 5 days a week. Stop exercising if you experience uterine contractions.  Avoid heavy lifting, wear low heel shoes, and practice good posture.  A sexual relationship may be continued unless your health care provider directs you otherwise. Relieving pain and discomfort  Wear a good support bra to prevent discomfort from breast tenderness.  Take warm sitz baths to soothe any pain or discomfort caused by hemorrhoids. Use hemorrhoid cream if your health care provider approves.  Rest with your legs elevated if you have leg cramps  or low back pain.  If you develop varicose veins, wear support hose. Elevate your feet for 15 minutes, 3-4 times a day. Limit salt in your diet. Prenatal Care  Write down your questions. Take them to your prenatal visits.  Keep all your prenatal visits as told by your health care provider. This is important. Safety  Wear your seat belt at all times when driving.  Make a list of emergency phone numbers, including numbers for family, friends, the hospital, and police and fire departments. General instructions  Ask your health care provider for a referral to a local prenatal education class. Begin classes no later than the beginning of month 6 of your pregnancy.  Ask for help if you have counseling or nutritional needs during pregnancy. Your health care provider can offer advice or refer you to specialists for help with various needs.  Do not use hot tubs, steam rooms, or saunas.  Do not douche or use tampons or scented sanitary pads.  Do not cross your legs for long periods of time.  Avoid cat litter boxes and soil  used by cats. These carry germs that can cause birth defects in the baby and possibly loss of the fetus by miscarriage or stillbirth.  Avoid all smoking, herbs, alcohol, and unprescribed drugs. Chemicals in these products can affect the formation and growth of the baby.  Do not use any products that contain nicotine or tobacco, such as cigarettes and e-cigarettes. If you need help quitting, ask your health care provider.  Visit your dentist if you have not gone yet during your pregnancy. Use a soft toothbrush to brush your teeth and be gentle when you floss. Contact a health care provider if:  You have dizziness.  You have mild pelvic cramps, pelvic pressure, or nagging pain in the abdominal area.  You have persistent nausea, vomiting, or diarrhea.  You have a bad smelling vaginal discharge.  You have pain when you urinate. Get help right away if:  You have a  fever.  You are leaking fluid from your vagina.  You have spotting or bleeding from your vagina.  You have severe abdominal cramping or pain.  You have rapid weight gain or weight loss.  You have shortness of breath with chest pain.  You notice sudden or extreme swelling of your face, hands, ankles, feet, or legs.  You have not felt your baby move in over an hour.  You have severe headaches that do not go away when you take medicine.  You have vision changes. Summary  The second trimester is from week 14 through week 27 (months 4 through 6). It is also a time when the fetus is growing rapidly.  Your body goes through many changes during pregnancy. The changes vary from woman to woman.  Avoid all smoking, herbs, alcohol, and unprescribed drugs. These chemicals affect the formation and growth your baby.  Do not use any tobacco products, such as cigarettes, chewing tobacco, and e-cigarettes. If you need help quitting, ask your health care provider.  Contact your health care provider if you have any questions. Keep all prenatal visits as told by your health care provider. This is important. This information is not intended to replace advice given to you by your health care provider. Make sure you discuss any questions you have with your health care provider. Document Released: 01/09/2001 Document Revised: 06/23/2015 Document Reviewed: 03/18/2012 Elsevier Interactive Patient Education  2017 [employer].

## 2017-06-13 ENCOUNTER — Telehealth: Payer: Self-pay | Admitting: Advanced Practice Midwife

## 2017-06-13 NOTE — Telephone Encounter (Signed)
Ob patient needs to speak to a nurse about her left foot and it is swollen

## 2017-06-13 NOTE — Telephone Encounter (Signed)
Patient states she has swelling in her left foot up to her ankle.  No redness, warmth or tenderness noted per patient.  States she does sleep on the side mostly and baby is on that side frequently.  Informed patient that swelling could be increased to just that side from laying on that side mostly and from the baby.  Advised to keep feet elevated and to let us know if it gets worse or she notices redness, warmth or tenderness.  Verbalized understanding.

## 2017-06-27 ENCOUNTER — Encounter: Payer: Self-pay | Admitting: Advanced Practice Midwife

## 2017-06-27 ENCOUNTER — Encounter: Payer: Federal, State, Local not specified - PPO | Admitting: Women's Health

## 2017-06-27 ENCOUNTER — Ambulatory Visit (INDEPENDENT_AMBULATORY_CARE_PROVIDER_SITE_OTHER): Payer: Federal, State, Local not specified - PPO | Admitting: Advanced Practice Midwife

## 2017-06-27 VITALS — BP 130/70 | HR 109 | Wt 374.8 lb

## 2017-06-27 DIAGNOSIS — Z3A24 24 weeks gestation of pregnancy: Secondary | ICD-10-CM

## 2017-06-27 DIAGNOSIS — Z1389 Encounter for screening for other disorder: Secondary | ICD-10-CM

## 2017-06-27 DIAGNOSIS — Z331 Pregnant state, incidental: Secondary | ICD-10-CM

## 2017-06-27 DIAGNOSIS — Z3402 Encounter for supervision of normal first pregnancy, second trimester: Secondary | ICD-10-CM

## 2017-06-27 LAB — POCT URINALYSIS DIPSTICK
Blood, UA: NEGATIVE
Glucose, UA: NEGATIVE
Nitrite, UA: NEGATIVE
Protein, UA: POSITIVE — AB

## 2017-06-27 NOTE — Progress Notes (Signed)
  G1P0 [redacted]w[redacted]d Estimated Date of Delivery: 10/17/17  Blood pressure 130/70, pulse (!) 109, weight (!) 374 lb 12.8 oz (170 kg), last menstrual period 12/30/2016.   BP weight and urine results all reviewed and noted.  Please refer to the obstetrical flow sheet for the fundal height and fetal heart rate documentation:  Patient reports fetal movement, denies any bleeding and no rupture of membranes symptoms or regular contractions. Patient is without complaints other than normal pregnancy complaints.  All questions were answered.   Physical Assessment:   Vitals:   06/27/17 1442  BP: 130/70  Pulse: (!) 109  Weight: (!) 374 lb 12.8 oz (170 kg)  Body mass index is 68.55 kg/m.        Physical Examination:   General appearance: Well appearing, and in no distress  Mental status: Alert, oriented to person, place, and time  Skin: Warm & dry  Cardiovascular: Normal heart rate noted  Respiratory: Normal respiratory effort, no distress  Abdomen: Soft, gravid, nontender  Pelvic: Cervical exam deferred         Extremities: Edema: Trace  Fetal Status: Fetal Heart Rate (bpm): 145   Movement: Present    Results for orders placed or performed in visit on 06/27/17 (from the past 24 hour(s))  POCT urinalysis dipstick   Collection Time: 06/27/17  2:44 PM  Result Value Ref Range   Color, UA     Clarity, UA     Glucose, UA Negative Negative   Bilirubin, UA     Ketones, UA 2+    Spec Grav, UA  1.010 - 1.025   Blood, UA neg    pH, UA  5.0 - 8.0   Protein, UA Positive (A) Negative   Urobilinogen, UA  0.2 or 1.0 E.U./dL   Nitrite, UA neg    Leukocytes, UA Moderate (2+) (A) Negative   Appearance     Odor       Orders Placed This Encounter  Procedures  . POCT urinalysis dipstick    Plan:  Continued routine obstetrical care,   Return in about 3 weeks (around 07/18/2017) for PN2/LROB.

## 2017-06-27 NOTE — Patient Instructions (Signed)

## 2017-07-02 ENCOUNTER — Encounter: Payer: Self-pay | Admitting: Women's Health

## 2017-07-02 ENCOUNTER — Encounter: Payer: Self-pay | Admitting: Family Medicine

## 2017-07-02 ENCOUNTER — Ambulatory Visit (INDEPENDENT_AMBULATORY_CARE_PROVIDER_SITE_OTHER): Payer: Federal, State, Local not specified - PPO | Admitting: Family Medicine

## 2017-07-02 VITALS — BP 123/71 | HR 81 | Temp 98.7°F | Ht 62.0 in | Wt 377.0 lb

## 2017-07-02 DIAGNOSIS — J301 Allergic rhinitis due to pollen: Secondary | ICD-10-CM

## 2017-07-02 DIAGNOSIS — H938X2 Other specified disorders of left ear: Secondary | ICD-10-CM

## 2017-07-02 MED ORDER — LORATADINE 10 MG PO TABS: 10.0000 mg | ORAL_TABLET | Freq: Every day | ORAL | 11 refills | Status: DC

## 2017-07-02 NOTE — Progress Notes (Signed)
Subjective: CC: "whooshing sound in left ear" PCP: Raliegh IpGottschalk, Jaelene Garciagarcia M, DO ZOX:[id number]:Anna Bary LericheS Torr is a 22 y.o. female presenting to clinic today for:  1. Ear concern Patient reports a 2 to 3-week history of intermittent "whooshing sounds in her left ear".  She denies any fevers, chills, ear pain or ear drainage.  She does note that she had sinus symptoms preceding but has not taken anything for them because she is pregnant.  She is currently followed by [facility] and is [redacted] weeks pregnant.  ROS: Per HPI  Allergies  Allergen Reactions  . Diflucan [Fluconazole] Rash   Past Medical History:  Diagnosis Date  . Allergy   . GERD (gastroesophageal reflux disease)     Current Outpatient Medications:  .  ferrous sulfate 325 (65 FE) MG tablet, Take 1 tablet (325 mg total) by mouth 2 (two) times daily with a meal., Disp: 60 tablet, Rfl: 3 .  pantoprazole (PROTONIX) 20 MG tablet, Take 1 tablet (20 mg total) by mouth daily., Disp: 30 tablet, Rfl: 6 .  Prenatal MV & Min w/FA-DHA (PRENATAL ADULT GUMMY/DHA/FA) 0.4-25 MG CHEW, Chew 1 tablet by mouth daily., Disp: 30 tablet, Rfl: 11 Social History   Socioeconomic History  . Marital status: Single    Spouse name: Not on file  . Number of children: Not on file  . Years of education: Not on file  . Highest education level: Not on file  Occupational History  . 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 Financial resource strain: Not on file  . Food insecurity:    Worry: Not on file    Inability: Not on file  . Transportation needs:    Medical: Not on file    Non-medical: Not on file  Tobacco Use  . Smoking status: Never Smoker  . Smokeless tobacco: Never Used  Substance and Sexual Activity  . Alcohol use: No  . Drug use: No  . Sexual activity: Not Currently    Birth control/protection: None  Lifestyle  . Physical activity:    Days per week: Not on file    Minutes per session: Not  on file  . Stress: Not on file  Relationships  . Social connections:    Talks on phone: Not on file    Gets together: Not on file    Attends religious service: Not on file    Active member of club or organization: Not on file    Attends meetings of clubs or organizations: Not on file    Relationship status: Not on file  . Intimate partner violence:    Fear of current or ex partner: Not on file    Emotionally abused: Not on file    Physically abused: Not on file    Forced sexual activity: Not on file  Other Topics Concern  . Not on file  Social History Narrative  . Not on file   Family History  Problem Relation Age of Onset  . Asthma Mother   . Anemia Mother   . Hypertension Father   . Cancer Maternal Grandfather   . Hypertension Paternal Grandmother   . Heart failure Paternal Grandmother     Objective: Office vital signs reviewed. BP 123/71   Pulse 81   Temp 98.7 F (37.1 C) (Oral)   Ht 5\' 2"  (1.575 m)   Wt (!) 377 lb (171 kg)   LMP 12/30/2016 (Approximate)   BMI 68.95 kg/m  Physical Examination:  General: Awake, alert, morbidly obese, No acute distress HEENT: Normal    Neck: No masses palpated. No lymphadenopathy    Ears: Tympanic membranes intact, normal light reflex, no erythema, no bulging    Eyes: PERRLA, extraocular membranes intact, sclera white    Nose: nasal turbinates moist, no nasal discharge    Throat: moist mucus membranes, no erythema, no tonsillar exudate.  Airway is patent Cardio: regular rate and rhythm, S1S2 heard, no murmurs appreciated Pulm: normal work of breathing on room air  Assessment/ Plan: 22 y.o. female   1. Seasonal allergic rhinitis due to pollen No evidence of infection or acute abnormalities within the ear exam today.  Blood pressure is controlled.  I suspect that symptoms are related to poor eustachian tube drainage in the setting of fluid production from allergic rhinitis.  I prescribed her Claritin 10 mg, which should be  okay in pregnancy.  Home care instructions were reviewed and reasons for return discussed.  Follow-up as needed.  2. Ear fullness, left    Meds ordered this encounter  Medications  . loratadine (CLARITIN) 10 MG tablet    Sig: Take 1 tablet (10 mg total) by mouth daily.    Dispense:  30 tablet    Refill:  11     [name], DO [facility] [phone]

## 2017-07-02 NOTE — Patient Instructions (Signed)
I have prescribed you Claritin for allergies.  This should help with the sensation you are having in the left ear.  You should remember to get your tetanus shot around 27-36 weeks so that your child is protected when they are born.  If you are unable to get it at your gynecologist office, this can be done here.  Allergic Rhinitis, Adult Allergic rhinitis is an allergic reaction that affects the mucous membrane inside the nose. It causes sneezing, a runny or stuffy nose, and the feeling of mucus going down the back of the throat (postnasal drip). Allergic rhinitis can be mild to severe. There are two types of allergic rhinitis:  Seasonal. This type is also called hay fever. It happens only during certain seasons.  Perennial. This type can happen at any time of the year.  What are the causes? This condition happens when the body's defense system (immune system) responds to certain harmless substances called allergens as though they were germs.  Seasonal allergic rhinitis is triggered by pollen, which can come from grasses, trees, and weeds. Perennial allergic rhinitis may be caused by:  House dust mites.  Pet dander.  Mold spores.  What are the signs or symptoms? Symptoms of this condition include:  Sneezing.  Runny or stuffy nose (nasal congestion).  Postnasal drip.  Itchy nose.  Tearing of the eyes.  Trouble sleeping.  Daytime sleepiness.  How is this diagnosed? This condition may be diagnosed based on:  Your medical history.  A physical exam.  Tests to check for related conditions, such as: ? Asthma. ? Pink eye. ? Ear infection. ? Upper respiratory infection.  Tests to find out which allergens trigger your symptoms. These may include skin or blood tests.  How is this treated? There is no cure for this condition, but treatment can help control symptoms. Treatment may include:  Taking medicines that block allergy symptoms, such as antihistamines. Medicine may be  given as a shot, nasal spray, or pill.  Avoiding the allergen.  Desensitization. This treatment involves getting ongoing shots until your body becomes less sensitive to the allergen. This treatment may be done if other treatments do not help.  If taking medicine and avoiding the allergen does not work, new, stronger medicines may be prescribed.  Follow these instructions at home:  Find out what you are allergic to. Common allergens include smoke, dust, and pollen.  Avoid the things you are allergic to. These are some things you can do to help avoid allergens: ? Replace carpet with wood, tile, or vinyl flooring. Carpet can trap dander and dust. ? Do not smoke. Do not allow smoking in your home. ? Change your heating and air conditioning filter at least once a month. ? During allergy season:  Keep windows closed as much as possible.  Plan outdoor activities when pollen counts are lowest. This is usually during the evening hours.  When coming indoors, change clothing and shower before sitting on furniture or bedding.  Take over-the-counter and prescription medicines only as told by your health care provider.  Keep all follow-up visits as told by your health care provider. This is important. Contact a health care provider if:  You have a fever.  You develop a persistent cough.  You make whistling sounds when you breathe (you wheeze).  Your symptoms interfere with your normal daily activities. Get help right away if:  You have shortness of breath. Summary  This condition can be managed by taking medicines as directed and avoiding  allergens.  Contact your health care provider if you develop a persistent cough or fever.  During allergy season, keep windows closed as much as possible. This information is not intended to replace advice given to you by your health care provider. Make sure you discuss any questions you have with your health care provider. Document Released:  10/10/2000 Document Revised: 02/23/2016 Document Reviewed: 02/23/2016 Elsevier Interactive Patient Education  [employer]2018 [employer]

## 2017-07-05 ENCOUNTER — Ambulatory Visit: Payer: Federal, State, Local not specified - PPO | Admitting: Family Medicine

## 2017-07-19 ENCOUNTER — Ambulatory Visit (INDEPENDENT_AMBULATORY_CARE_PROVIDER_SITE_OTHER): Payer: Federal, State, Local not specified - PPO | Admitting: Women's Health

## 2017-07-19 ENCOUNTER — Other Ambulatory Visit: Payer: Federal, State, Local not specified - PPO

## 2017-07-19 ENCOUNTER — Encounter: Payer: Self-pay | Admitting: Women's Health

## 2017-07-19 VITALS — BP 139/84 | HR 104 | Wt 378.0 lb

## 2017-07-19 DIAGNOSIS — Z23 Encounter for immunization: Secondary | ICD-10-CM

## 2017-07-19 DIAGNOSIS — Z3A27 27 weeks gestation of pregnancy: Secondary | ICD-10-CM

## 2017-07-19 DIAGNOSIS — Z131 Encounter for screening for diabetes mellitus: Secondary | ICD-10-CM

## 2017-07-19 DIAGNOSIS — Z1389 Encounter for screening for other disorder: Secondary | ICD-10-CM

## 2017-07-19 DIAGNOSIS — Z3402 Encounter for supervision of normal first pregnancy, second trimester: Secondary | ICD-10-CM

## 2017-07-19 DIAGNOSIS — Z331 Pregnant state, incidental: Secondary | ICD-10-CM

## 2017-07-19 LAB — POCT URINALYSIS DIPSTICK
Blood, UA: NEGATIVE
GLUCOSE UA: NEGATIVE
Ketones, UA: NEGATIVE
NITRITE UA: NEGATIVE
PROTEIN UA: NEGATIVE

## 2017-07-19 NOTE — Patient Instructions (Signed)
Anna [name], I greatly value your feedback.  If you receive a survey following your visit with us today, we appreciate you taking the time to fill it out.  Thanks, Joellyn HaffKim Santina Trillo, CNM, WHNP-BC   Call the office [phone]) or go to [facility] if:  You begin to have strong, frequent contractions  Your water breaks.  Sometimes it is a big gush of fluid, sometimes it is just a trickle that keeps getting your panties wet or running down your legs  You have vaginal bleeding.  It is normal to have a small amount of spotting if your cervix was checked.   You don't feel your baby moving like normal.  If you don't, get you something to eat and drink and lay down and focus on feeling your baby move.  You should feel at least 10 movements in 2 hours.  If you don't, you should call the office or go to [facility].    Tdap Vaccine  It is recommended that you get the Tdap vaccine during the third trimester of EACH pregnancy to help protect your baby from getting pertussis (whooping cough)  27-36 weeks is the BEST time to do this so that you can pass the protection on to your baby. During pregnancy is better than after pregnancy, but if you are unable to get it during pregnancy it will be offered at the hospital.   You can get this vaccine at the health department or your family doctor  Everyone who will be around your baby should also be up-to-date on their vaccines. Adults (who are not pregnant) only need 1 dose of Tdap during adulthood.   Third Trimester of Pregnancy The third trimester is from week 29 through week 42, months 7 through 9. The third trimester is a time when the fetus is growing rapidly. At the end of the ninth month, the fetus is about 20 inches in length and weighs 6-10 pounds.  BODY CHANGES Your body goes through many changes during pregnancy. The changes vary from woman to woman.   Your weight will continue to increase. You can expect to gain 25-35 pounds (11-16 kg)  by the end of the pregnancy.  You may begin to get stretch marks on your hips, abdomen, and breasts.  You may urinate more often because the fetus is moving lower into your pelvis and pressing on your bladder.  You may develop or continue to have heartburn as a result of your pregnancy.  You may develop constipation because certain hormones are causing the muscles that push waste through your intestines to slow down.  You may develop hemorrhoids or swollen, bulging veins (varicose veins).  You may have pelvic pain because of the weight gain and pregnancy hormones relaxing your joints between the bones in your pelvis. Backaches may result from overexertion of the muscles supporting your posture.  You may have changes in your hair. These can include thickening of your hair, rapid growth, and changes in texture. Some women also have hair loss during or after pregnancy, or hair that feels dry or thin. Your hair will most likely return to normal after your baby is born.  Your breasts will continue to grow and be tender. A yellow discharge may leak from your breasts called colostrum.  Your belly button may stick out.  You may feel short of breath because of your expanding uterus.  You may notice the fetus "dropping," or moving lower in your abdomen.  You may have a bloody  mucus discharge. This usually occurs a few days to a week before labor begins.  Your cervix becomes thin and soft (effaced) near your due date. WHAT TO EXPECT AT YOUR PRENATAL EXAMS  You will have prenatal exams every 2 weeks until week 36. Then, you will have weekly prenatal exams. During a routine prenatal visit:  You will be weighed to make sure you and the fetus are growing normally.  Your blood pressure is taken.  Your abdomen will be measured to track your baby's growth.  The fetal heartbeat will be listened to.  Any test results from the previous visit will be discussed.  You may have a cervical check near  your due date to see if you have effaced. At around 36 weeks, your caregiver will check your cervix. At the same time, your caregiver will also perform a test on the secretions of the vaginal tissue. This test is to determine if a type of bacteria, Group B streptococcus, is present. Your caregiver will explain this further. Your caregiver may ask you:  What your birth plan is.  How you are feeling.  If you are feeling the baby move.  If you have had any abnormal symptoms, such as leaking fluid, bleeding, severe headaches, or abdominal cramping.  If you have any questions. Other tests or screenings that may be performed during your third trimester include:  Blood tests that check for low iron levels (anemia).  Fetal testing to check the health, activity level, and growth of the fetus. Testing is done if you have certain medical conditions or if there are problems during the pregnancy. FALSE LABOR You may feel small, irregular contractions that eventually go away. These are called Braxton Hicks contractions, or false labor. Contractions may last for hours, days, or even weeks before true labor sets in. If contractions come at regular intervals, intensify, or become painful, it is best to be seen by your caregiver.  SIGNS OF LABOR   Menstrual-like cramps.  Contractions that are 5 minutes apart or less.  Contractions that start on the top of the uterus and spread down to the lower abdomen and back.  A sense of increased pelvic pressure or back pain.  A watery or bloody mucus discharge that comes from the vagina. If you have any of these signs before the 37th week of pregnancy, call your caregiver right away. You need to go to the hospital to get checked immediately. HOME CARE INSTRUCTIONS   Avoid all smoking, herbs, alcohol, and unprescribed drugs. These chemicals affect the formation and growth of the baby.  Follow your caregiver's instructions regarding medicine use. There are  medicines that are either safe or unsafe to take during pregnancy.  Exercise only as directed by your caregiver. Experiencing uterine cramps is a good sign to stop exercising.  Continue to eat regular, healthy meals.  Wear a good support bra for breast tenderness.  Do not use hot tubs, steam rooms, or saunas.  Wear your seat belt at all times when driving.  Avoid raw meat, uncooked cheese, cat litter boxes, and soil used by cats. These carry germs that can cause birth defects in the baby.  Take your prenatal vitamins.  Try taking a stool softener (if your caregiver approves) if you develop constipation. Eat more high-fiber foods, such as fresh vegetables or fruit and whole grains. Drink plenty of fluids to keep your urine clear or pale yellow.  Take warm sitz baths to soothe any pain or discomfort caused by hemorrhoids.  Use hemorrhoid cream if your caregiver approves.  If you develop varicose veins, wear support hose. Elevate your feet for 15 minutes, 3-4 times a day. Limit salt in your diet.  Avoid heavy lifting, wear low heal shoes, and practice good posture.  Rest a lot with your legs elevated if you have leg cramps or low back pain.  Visit your dentist if you have not gone during your pregnancy. Use a soft toothbrush to brush your teeth and be gentle when you floss.  A sexual relationship may be continued unless your caregiver directs you otherwise.  Do not travel far distances unless it is absolutely necessary and only with the approval of your caregiver.  Take prenatal classes to understand, practice, and ask questions about the labor and delivery.  Make a trial run to the hospital.  Pack your hospital bag.  Prepare the baby's nursery.  Continue to go to all your prenatal visits as directed by your caregiver. SEEK MEDICAL CARE IF:  You are unsure if you are in labor or if your water has broken.  You have dizziness.  You have mild pelvic cramps, pelvic pressure, or  nagging pain in your abdominal area.  You have persistent nausea, vomiting, or diarrhea.  You have a bad smelling vaginal discharge.  You have pain with urination. SEEK IMMEDIATE MEDICAL CARE IF:   You have a fever.  You are leaking fluid from your vagina.  You have spotting or bleeding from your vagina.  You have severe abdominal cramping or pain.  You have rapid weight loss or gain.  You have shortness of breath with chest pain.  You notice sudden or extreme swelling of your face, hands, ankles, feet, or legs.  You have not felt your baby move in over an hour.  You have severe headaches that do not go away with medicine.  You have vision changes. Document Released: 01/09/2001 Document Revised: 01/20/2013 Document Reviewed: 03/18/2012 [facility] Patient Information 2015 [place], [place]. This information is not intended to replace advice given to you by your health care provider. Make sure you discuss any questions you have with your health care provider.

## 2017-07-19 NOTE — Progress Notes (Signed)
   LOW-RISK PREGNANCY VISIT Patient name: Anna [name] MRN [id number]  Date of birth: 26-Feb-1995 Chief Complaint:   Routine Prenatal Visit (PN2 today)  History of Present Illness:   Anna [name] is a 22 y.o. G1P0 female at 8063w1d with an Estimated Date of Delivery: 10/17/17 being seen today for ongoing management of a low-risk pregnancy.  Today she reports no complaints. Contractions: Irregular. Vag. Bleeding: None.  Movement: Present. denies leaking of fluid. Review of Systems:   Pertinent items are noted in HPI Denies abnormal vaginal discharge w/ itching/odor/irritation, headaches, visual changes, shortness of breath, chest pain, abdominal pain, severe nausea/vomiting, or problems with urination or bowel movements unless otherwise stated above. Pertinent History Reviewed:  Reviewed past medical,surgical, social, obstetrical and family history.  Reviewed problem list, medications and allergies. Physical Assessment:   Vitals:   07/19/17 0940  BP: 139/84  Pulse: (!) 104  Weight: (!) 378 lb (171.5 kg)  Body mass index is 69.14 kg/m.        Physical Examination:   General appearance: Well appearing, and in no distress  Mental status: Alert, oriented to person, place, and time  Skin: Warm & dry  Cardiovascular: Normal heart rate noted  Respiratory: Normal respiratory effort, no distress  Abdomen: Soft, gravid, nontender  Pelvic: Cervical exam deferred         Extremities: Edema: Trace  Fetal Status: Fetal Heart Rate (bpm): 150 Fundal Height: 26 cm from U  Movement: Present    Results for orders placed or performed in visit on 07/19/17 (from the past 24 hour(s))  POCT urinalysis dipstick   Collection Time: 07/19/17  9:41 AM  Result Value Ref Range   Color, UA     Clarity, UA     Glucose, UA Negative Negative   Bilirubin, UA     Ketones, UA neg    Spec Grav, UA  1.010 - 1.025   Blood, UA neg    pH, UA  5.0 - 8.0   Protein, UA Negative Negative   Urobilinogen, UA   0.2 or 1.0 E.U./dL   Nitrite, UA neg    Leukocytes, UA Moderate (2+) (A) Negative   Appearance     Odor      Assessment & Plan:  1) Low-risk pregnancy G1P0 at 7563w1d with an Estimated Date of Delivery: 10/17/17    Meds: No orders of the defined types were placed in this encounter.  Labs/procedures today: pn2, tdap  Plan:  Continue routine obstetrical care   Reviewed: Preterm labor symptoms and general obstetric precautions including but not limited to vaginal bleeding, contractions, leaking of fluid and fetal movement were reviewed in detail with the patient.  All questions were answered  Follow-up: Return in about 3 weeks (around 08/09/2017) for LROB.  Orders Placed This Encounter  Procedures  . Tdap vaccine greater than or equal to 7yo IM  . POCT urinalysis dipstick   Cheral MarkerKimberly R Booker CNM, [facility] 07/19/2017 10:20 AM

## 2017-07-20 LAB — CBC
HEMATOCRIT: 30.7 % — AB (ref 34.0–46.6)
HEMOGLOBIN: 9.7 g/dL — AB (ref 11.1–15.9)
MCH: 24.7 pg — ABNORMAL LOW (ref 26.6–33.0)
MCHC: 31.6 g/dL (ref 31.5–35.7)
MCV: 78 fL — ABNORMAL LOW (ref 79–97)
Platelets: 282 10*3/uL (ref 150–450)
RBC: 3.93 x10E6/uL (ref 3.77–5.28)
RDW: 13.2 % (ref 12.3–15.4)
WBC: 8 10*3/uL (ref 3.4–10.8)

## 2017-07-20 LAB — GLUCOSE TOLERANCE, 2 HOURS W/ 1HR
GLUCOSE, 1 HOUR: 122 mg/dL (ref 65–179)
GLUCOSE, 2 HOUR: 103 mg/dL (ref 65–152)
Glucose, Fasting: 81 mg/dL (ref 65–91)

## 2017-07-20 LAB — RPR: RPR: NONREACTIVE

## 2017-07-20 LAB — HIV ANTIBODY (ROUTINE TESTING W REFLEX): HIV SCREEN 4TH GENERATION: NONREACTIVE

## 2017-07-20 LAB — ANTIBODY SCREEN: ANTIBODY SCREEN: NEGATIVE

## 2017-07-22 ENCOUNTER — Telehealth: Payer: Self-pay | Admitting: *Deleted

## 2017-07-22 NOTE — Telephone Encounter (Signed)
Pt informed of low hgb. She has not been taking iron at all. Advised her to start and to eat foods high in iron. She is agreeable to this.

## 2017-07-22 NOTE — Telephone Encounter (Signed)
Hgb is 9.7 down from 10.7, please make sure she is taking pnv daily and Fe BID as directed (if not-start), if she is- she needs to increase it to TID. Eat foods high in iron (green leafy vegs, beans, red meats, etc). Thanks!   Attempted to call patient, her mother answered and stated that she would tell her to call us back.

## 2017-07-24 ENCOUNTER — Ambulatory Visit: Payer: Federal, State, Local not specified - PPO | Admitting: Family Medicine

## 2017-07-24 NOTE — Progress Notes (Deleted)
Subjective: CC: nose bleeds PCP: Raliegh IpGottschalk, Eevee Borbon M, DO ZOX:[id number]:Lethia S Remus Anna [name] is a 22 y.o. female presenting to clinic today for:  1. Nose bleeds ***   ROS: Per HPI  Allergies  Allergen Reactions  . Diflucan [Fluconazole] Rash   Past Medical History:  Diagnosis Date  . Allergy   . GERD (gastroesophageal reflux disease)     Current Outpatient Medications:  .  ferrous sulfate 325 (65 FE) MG tablet, Take 1 tablet (325 mg total) by mouth 2 (two) times daily with a meal., Disp: 60 tablet, Rfl: 3 .  loratadine (CLARITIN) 10 MG tablet, Take 1 tablet (10 mg total) by mouth daily., Disp: 30 tablet, Rfl: 11 .  pantoprazole (PROTONIX) 20 MG tablet, Take 1 tablet (20 mg total) by mouth daily., Disp: 30 tablet, Rfl: 6 .  Prenatal MV & Min w/FA-DHA (PRENATAL ADULT GUMMY/DHA/FA) 0.4-25 MG CHEW, Chew 1 tablet by mouth daily., Disp: 30 tablet, Rfl: 11 Social History   Socioeconomic History  . Marital status: Single    Spouse name: Not on file  . Number of children: Not on file  . Years of education: Not on file  . Highest education level: Not on file  Occupational History  . 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 Financial resource strain: Not on file  . Food insecurity:    Worry: Not on file    Inability: Not on file  . Transportation needs:    Medical: Not on file    Non-medical: Not on file  Tobacco Use  . Smoking status: Never Smoker  . Smokeless tobacco: Never Used  Substance and Sexual Activity  . Alcohol use: No  . Drug use: No  . Sexual activity: Not Currently    Birth control/protection: None  Lifestyle  . Physical activity:    Days per week: Not on file    Minutes per session: Not on file  . Stress: Not on file  Relationships  . Social connections:    Talks on phone: Not on file    Gets together: Not on file    Attends religious service: Not on file    Active member of club or organization: Not on file   Attends meetings of clubs or organizations: Not on file    Relationship status: Not on file  . Intimate partner violence:    Fear of current or ex partner: Not on file    Emotionally abused: Not on file    Physically abused: Not on file    Forced sexual activity: Not on file  Other Topics Concern  . Not on file  Social History Narrative  . Not on file   Family History  Problem Relation Age of Onset  . Asthma Mother   . Anemia Mother   . Hypertension Father   . Cancer Maternal Grandfather   . Hypertension Paternal Grandmother   . Heart failure Paternal Grandmother     Objective: Office vital signs reviewed. LMP 12/30/2016 (Approximate)   Physical Examination:  General: Awake, alert, *** nourished, No acute distress HEENT: Normal    Neck: No masses palpated. No lymphadenopathy    Ears: Tympanic membranes intact, normal light reflex, no erythema, no bulging    Eyes: PERRLA, extraocular membranes intact, sclera ***    Nose: nasal turbinates moist, *** nasal discharge    Throat: moist mucus membranes, no erythema, *** tonsillar exudate.  Airway is patent Cardio: regular rate and rhythm,  S1S2 heard, no murmurs appreciated Pulm: clear to auscultation bilaterally, no wheezes, rhonchi or rales; normal work of breathing on room air GI: soft, non-tender, non-distended, bowel sounds present x4, no hepatomegaly, no splenomegaly, no masses GU: external vaginal tissue ***, cervix ***, *** punctate lesions on cervix appreciated, *** discharge from cervical os, *** bleeding, *** cervical motion tenderness, *** abdominal/ adnexal masses Extremities: warm, well perfused, No edema, cyanosis or clubbing; +*** pulses bilaterally MSK: *** gait and *** station Skin: dry; intact; no rashes or lesions Neuro: *** Strength and light touch sensation grossly intact, *** DTRs ***/4  Assessment/ Plan: 22 y.o. female   ***  No orders of the defined types were placed in this encounter.  No orders of  the defined types were placed in this encounter.    Raliegh Ip, DO [facility] [phone]

## 2017-07-25 ENCOUNTER — Encounter: Payer: Self-pay | Admitting: Family Medicine

## 2017-08-08 ENCOUNTER — Encounter: Payer: Self-pay | Admitting: Advanced Practice Midwife

## 2017-08-08 ENCOUNTER — Ambulatory Visit (INDEPENDENT_AMBULATORY_CARE_PROVIDER_SITE_OTHER): Payer: Federal, State, Local not specified - PPO | Admitting: Advanced Practice Midwife

## 2017-08-08 ENCOUNTER — Other Ambulatory Visit: Payer: Self-pay

## 2017-08-08 VITALS — BP 126/84 | HR 90 | Wt 383.0 lb

## 2017-08-08 DIAGNOSIS — Z3403 Encounter for supervision of normal first pregnancy, third trimester: Secondary | ICD-10-CM

## 2017-08-08 DIAGNOSIS — Z3A30 30 weeks gestation of pregnancy: Secondary | ICD-10-CM

## 2017-08-08 DIAGNOSIS — O99323 Drug use complicating pregnancy, third trimester: Secondary | ICD-10-CM

## 2017-08-08 DIAGNOSIS — Z331 Pregnant state, incidental: Secondary | ICD-10-CM

## 2017-08-08 DIAGNOSIS — F1290 Cannabis use, unspecified, uncomplicated: Secondary | ICD-10-CM

## 2017-08-08 DIAGNOSIS — Z1389 Encounter for screening for other disorder: Secondary | ICD-10-CM

## 2017-08-08 LAB — POCT URINALYSIS DIPSTICK
Blood, UA: NEGATIVE
Glucose, UA: NEGATIVE
Ketones, UA: NEGATIVE
NITRITE UA: NEGATIVE
PROTEIN UA: NEGATIVE

## 2017-08-08 MED ORDER — FERROUS SULFATE DRIED ER 160 (50 FE) MG PO TBCR: 160.0000 mg | EXTENDED_RELEASE_TABLET | Freq: Every day | ORAL | 6 refills | Status: DC

## 2017-08-08 NOTE — Progress Notes (Signed)
  G1P0 846w0d Estimated Date of Delivery: 10/17/17  Blood pressure 126/84, pulse 90, weight (!) 383 lb (173.7 kg), last menstrual period 12/30/2016.   BP weight and urine results all reviewed and noted.  Please refer to the obstetrical flow sheet for the fundal height and fetal heart rate documentation:  Patient reports good fetal movement, denies any bleeding and no rupture of membranes symptoms or regular contractions. Patient is without complaints. All questions were answered.   Physical Assessment:   Vitals:   08/08/17 1346  BP: 126/84  Pulse: 90  Weight: (!) 383 lb (173.7 kg)  Body mass index is 70.05 kg/m.        Physical Examination:   General appearance: Well appearing, and in no distress  Mental status: Alert, oriented to person, place, and time  Skin: Warm & dry  Cardiovascular: Normal heart rate noted  Respiratory: Normal respiratory effort, no distress  Abdomen: Soft, gravid, nontender  Pelvic: Cervical exam deferred         Extremities: Edema: Trace  Fetal Status:     Movement: Present    Results for orders placed or performed in visit on 08/08/17 (from the past 24 hour(s))  POCT urinalysis dipstick   Collection Time: 08/08/17  1:47 PM  Result Value Ref Range   Color, UA     Clarity, UA     Glucose, UA Negative Negative   Bilirubin, UA     Ketones, UA neg    Spec Grav, UA  1.010 - 1.025   Blood, UA neg    pH, UA  5.0 - 8.0   Protein, UA Negative Negative   Urobilinogen, UA  0.2 or 1.0 E.U./dL   Nitrite, UA neg    Leukocytes, UA Moderate (2+) (A) Negative   Appearance     Odor       Orders Placed This Encounter  Procedures  . Pain Management Screening Profile (10S)  . POCT urinalysis dipstick    Plan:  Continued routine obstetrical care, change iron to slow release d/t GI upset   Return in about 2 weeks (around 08/22/2017) for LROB.

## 2017-08-09 LAB — PMP SCREEN PROFILE (10S), URINE
Amphetamine Scrn, Ur: NEGATIVE ng/mL
BARBITURATE SCREEN URINE: NEGATIVE ng/mL
BENZODIAZEPINE SCREEN, URINE: NEGATIVE ng/mL
CANNABINOIDS UR QL SCN: NEGATIVE ng/mL
COCAINE(METAB.)SCREEN, URINE: NEGATIVE ng/mL
CREATININE(CRT), U: 208 mg/dL (ref 20.0–300.0)
METHADONE SCREEN, URINE: NEGATIVE ng/mL
OXYCODONE+OXYMORPHONE UR QL SCN: NEGATIVE ng/mL
Opiate Scrn, Ur: NEGATIVE ng/mL
Ph of Urine: 7.8 (ref 4.5–8.9)
Phencyclidine Qn, Ur: NEGATIVE ng/mL
Propoxyphene Scrn, Ur: NEGATIVE ng/mL

## 2017-08-12 ENCOUNTER — Other Ambulatory Visit: Payer: Self-pay

## 2017-08-12 ENCOUNTER — Encounter (HOSPITAL_COMMUNITY): Payer: Self-pay

## 2017-08-12 ENCOUNTER — Inpatient Hospital Stay (HOSPITAL_COMMUNITY)
Admission: AD | Admit: 2017-08-12 | Discharge: 2017-08-12 | Disposition: A | Discharge status: Home / Self Care | Payer: Federal, State, Local not specified - PPO | Source: Ambulatory Visit | Attending: Obstetrics & Gynecology | Admitting: Obstetrics & Gynecology

## 2017-08-12 ENCOUNTER — Inpatient Hospital Stay (HOSPITAL_BASED_OUTPATIENT_CLINIC_OR_DEPARTMENT_OTHER): Payer: Federal, State, Local not specified - PPO

## 2017-08-12 DIAGNOSIS — Z3A30 30 weeks gestation of pregnancy: Secondary | ICD-10-CM

## 2017-08-12 DIAGNOSIS — O368130 Decreased fetal movements, third trimester, not applicable or unspecified: Secondary | ICD-10-CM | POA: Diagnosis not present

## 2017-08-12 DIAGNOSIS — O99213 Obesity complicating pregnancy, third trimester: Secondary | ICD-10-CM

## 2017-08-12 DIAGNOSIS — Z79899 Other long term (current) drug therapy: Secondary | ICD-10-CM | POA: Insufficient documentation

## 2017-08-12 DIAGNOSIS — K219 Gastro-esophageal reflux disease without esophagitis: Secondary | ICD-10-CM | POA: Diagnosis not present

## 2017-08-12 DIAGNOSIS — O99613 Diseases of the digestive system complicating pregnancy, third trimester: Secondary | ICD-10-CM | POA: Diagnosis not present

## 2017-08-12 DIAGNOSIS — Z9889 Other specified postprocedural states: Secondary | ICD-10-CM | POA: Diagnosis not present

## 2017-08-12 DIAGNOSIS — Z883 Allergy status to other anti-infective agents status: Secondary | ICD-10-CM | POA: Diagnosis not present

## 2017-08-12 DIAGNOSIS — O26893 Other specified pregnancy related conditions, third trimester: Secondary | ICD-10-CM | POA: Insufficient documentation

## 2017-08-12 DIAGNOSIS — R109 Unspecified abdominal pain: Secondary | ICD-10-CM

## 2017-08-12 LAB — URINALYSIS, ROUTINE W REFLEX MICROSCOPIC
Bilirubin Urine: NEGATIVE
GLUCOSE, UA: NEGATIVE mg/dL
KETONES UR: NEGATIVE mg/dL
NITRITE: NEGATIVE
PROTEIN: 30 mg/dL — AB
Specific Gravity, Urine: 1.024 (ref 1.005–1.030)
pH: 6 (ref 5.0–8.0)

## 2017-08-12 MED ORDER — DOCUSATE SODIUM 100 MG PO CAPS: 100.0000 mg | ORAL_CAPSULE | Freq: Two times a day (BID) | ORAL | 2 refills | Status: DC | PRN

## 2017-08-12 MED ORDER — COMFORT FIT MATERNITY SUPP MED MISC: 1.0000 | Freq: Every day | 0 refills | Status: DC

## 2017-08-12 NOTE — MAU Note (Signed)
Pt states that she had not felt baby move all day. Pt also having some cramping.

## 2017-08-12 NOTE — MAU Provider Note (Signed)
Chief Complaint: Decreased Fetal Movement   First Provider Initiated Contact with Patient 08/12/17 0424      SUBJECTIVE HPI: Anna [name] is a 22 y.o. G1P0 at 2790w4d by LMP who presents to maternity admissions reporting no fetal movement x 24 hours and pelvic pain and pressure starting today. She describes the pain as mostly on her right lower side that radiates down into her groin. The pain is sharp, worse when walking, and improved when she leans forward or brings her knees to her chest.  She reports constipation with her PO iron daily.  There are no other associated symptoms.  She has not tried any treatments. She denies vaginal bleeding, vaginal itching/burning, urinary symptoms, h/a, dizziness, n/v, or fever/chills.     HPI  Past Medical History:  Diagnosis Date  . Allergy   . GERD (gastroesophageal reflux disease)    Past Surgical History:  Procedure Laterality Date  . MYRINGOTOMY    . TONSILLECTOMY AND ADENOIDECTOMY     Social History   Socioeconomic History  . Marital status: Single    Spouse name: Not on file  . Number of children: Not on file  . Years of education: Not on file  . Highest education level: Not on file  Occupational History  . 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 Financial resource strain: Not on file  . Food insecurity:    Worry: Not on file    Inability: Not on file  . Transportation needs:    Medical: Not on file    Non-medical: Not on file  Tobacco Use  . Smoking status: Never Smoker  . Smokeless tobacco: Never Used  Substance and Sexual Activity  . Alcohol use: No  . Drug use: No  . Sexual activity: Not Currently    Birth control/protection: None  Lifestyle  . Physical activity:    Days per week: Not on file    Minutes per session: Not on file  . Stress: Not on file  Relationships  . Social connections:    Talks on phone: Not on file    Gets together: Not on file    Attends  religious service: Not on file    Active member of club or organization: Not on file    Attends meetings of clubs or organizations: Not on file    Relationship status: Not on file  . Intimate partner violence:    Fear of current or ex partner: Not on file    Emotionally abused: Not on file    Physically abused: Not on file    Forced sexual activity: Not on file  Other Topics Concern  . Not on file  Social History Narrative  . Not on file   No current facility-administered medications on file prior to encounter.    Current Outpatient Medications on File Prior to Encounter  Medication Sig Dispense Refill  . ferrous sulfate 325 (65 FE) MG tablet Take 1 tablet (325 mg total) by mouth 2 (two) times daily with a meal. 60 tablet 3  . loratadine (CLARITIN) 10 MG tablet Take 1 tablet (10 mg total) by mouth daily. 30 tablet 11  . pantoprazole (PROTONIX) 20 MG tablet Take 1 tablet (20 mg total) by mouth daily. 30 tablet 6  . Prenatal MV & Min w/FA-DHA (PRENATAL ADULT GUMMY/DHA/FA) 0.4-25 MG CHEW Chew 1 tablet by mouth daily. 30 tablet 11  . ferrous sulfate (SLOW RELEASE IRON) 160 (50 Fe)  MG TBCR SR tablet Take 1 tablet (160 mg total) by mouth daily. 30 each 6   Allergies  Allergen Reactions  . Diflucan [Fluconazole] Rash    ROS:  Review of Systems  Constitutional: Negative for chills, fatigue and fever.  Eyes: Negative for visual disturbance.  Respiratory: Negative for shortness of breath.   Cardiovascular: Negative for chest pain.  Gastrointestinal: Positive for abdominal pain and constipation. Negative for nausea and vomiting.  Genitourinary: Positive for pelvic pain. Negative for difficulty urinating, dysuria, flank pain, vaginal bleeding, vaginal discharge and vaginal pain.  Neurological: Negative for dizziness and headaches.  Psychiatric/Behavioral: Negative.      I have reviewed patient's Past Medical Hx, Surgical Hx, Family Hx, Social Hx, medications and allergies.   Physical  Exam   Patient Vitals for the past 24 hrs:  BP Temp Temp src Pulse Resp Weight  08/12/17 0220 120/65 98.9 F (37.2 C) Oral 94 18 -  08/12/17 0200 - - - - - (!) 385 lb (174.6 kg)   Constitutional: Well-developed, well-nourished female in no acute distress.  Cardiovascular: normal rate Respiratory: normal effort GI: Abd soft, non-tender. Pos BS x 4 MS: Extremities nontender, no edema, normal ROM Neurologic: Alert and oriented x 4.  GU: Neg CVAT.  PELVIC EXAM: Cervix pink, visually closed, without lesion, scant white creamy discharge, vaginal walls and external genitalia normal Bimanual exam: Cervix 0/long/high, firm, anterior, neg CMT, uterus nontender, nonenlarged, adnexa without tenderness, enlargement, or mass  FHT 155 by doppler  LAB RESULTS Results for orders placed or performed during the hospital encounter of 08/12/17 (from the past 24 hour(s))  Urinalysis, Routine w reflex microscopic     Status: Abnormal   Collection Time: 08/12/17  2:24 AM  Result Value Ref Range   Color, Urine YELLOW YELLOW   APPearance CLOUDY (A) CLEAR   Specific Gravity, Urine 1.024 1.005 - 1.030   pH 6.0 5.0 - 8.0   Glucose, UA NEGATIVE NEGATIVE mg/dL   Hgb urine dipstick SMALL (A) NEGATIVE   Bilirubin Urine NEGATIVE NEGATIVE   Ketones, ur NEGATIVE NEGATIVE mg/dL   Protein, ur 30 (A) NEGATIVE mg/dL   Nitrite NEGATIVE NEGATIVE   Leukocytes, UA LARGE (A) NEGATIVE   RBC / HPF 21-50 0 - 5 RBC/hpf   WBC, UA 21-50 0 - 5 WBC/hpf   Bacteria, UA RARE (A) NONE SEEN   Squamous Epithelial / LPF 11-20 0 - 5   Mucus PRESENT     O/Positive/-- (02/21 1522)  IMAGING No results found.  MAU Management/MDM: UA with large leukocytes but not a clean catch, will send for culture.  CBC, CMP initially ordered but lab unable to obtain sample x 3 sticks.  No evidence of preterm labor with closed cervix.  Pain most c/w round ligament/musculoskeletal pain. Rest/ice/heat/warm bath/Tylenol/pregnancy support belt. Rx  written for support belt.  Pt to f/u at [facility]. Return to MAU as needed for emergencies. Pt discharged with strict preterm labor/return precautions.  ASSESSMENT 1. Abdominal pain in pregnancy, third trimester   2. Decreased fetal movement affecting management of pregnancy in third trimester     PLAN Discharge home Allergies as of 08/12/2017      Reactions   Diflucan [fluconazole] Rash      Medication List    STOP taking these medications   ferrous sulfate 325 (65 FE) MG tablet     TAKE these medications   COMFORT FIT MATERNITY SUPP MED Misc 1 Device by Does not apply route daily.  docusate sodium 100 MG capsule Commonly known as:  COLACE Take 1 capsule (100 mg total) by mouth 2 (two) times daily as needed.   ferrous sulfate 160 (50 Fe) MG Tbcr SR tablet Commonly known as:  SLOW RELEASE IRON Take 1 tablet (160 mg total) by mouth daily.   loratadine 10 MG tablet Commonly known as:  CLARITIN Take 1 tablet (10 mg total) by mouth daily.   pantoprazole 20 MG tablet Commonly known as:  PROTONIX Take 1 tablet (20 mg total) by mouth daily.   Prenatal Adult Gummy/DHA/FA 0.4-25 MG Chew Chew 1 tablet by mouth daily.      Follow-up Information    [facility] Follow up.   Why:  As scheduled, or sooner if symptoms continue. Return to MAU as needed for emergencies. Contact information: [address] [place] [address] [phone]          Sharen Counter Certified Nurse-Midwife 08/12/2017  5:52 AM

## 2017-08-12 NOTE — MAU Note (Signed)
Pt. States she has not felt baby move all day. Last time felt baby's movement was Sunday night at 0130am. Pt. states baby is usually very active. Pt. Reports cramping on lower right side rated 9/10. Denies vaginal bleeding.

## 2017-08-13 LAB — CULTURE, OB URINE

## 2017-08-14 ENCOUNTER — Encounter: Payer: Self-pay | Admitting: Women's Health

## 2017-08-15 ENCOUNTER — Other Ambulatory Visit: Payer: Self-pay | Admitting: Women's Health

## 2017-08-15 MED ORDER — CETIRIZINE HCL 5 MG PO TABS: 5.0000 mg | ORAL_TABLET | Freq: Every day | ORAL | 3 refills | Status: DC

## 2017-08-23 ENCOUNTER — Ambulatory Visit (INDEPENDENT_AMBULATORY_CARE_PROVIDER_SITE_OTHER): Payer: Federal, State, Local not specified - PPO | Admitting: Women's Health

## 2017-08-23 ENCOUNTER — Encounter: Payer: Self-pay | Admitting: Women's Health

## 2017-08-23 VITALS — BP 140/85 | HR 86 | Wt 387.6 lb

## 2017-08-23 DIAGNOSIS — Z331 Pregnant state, incidental: Secondary | ICD-10-CM

## 2017-08-23 DIAGNOSIS — Z1389 Encounter for screening for other disorder: Secondary | ICD-10-CM

## 2017-08-23 DIAGNOSIS — Z3A32 32 weeks gestation of pregnancy: Secondary | ICD-10-CM

## 2017-08-23 DIAGNOSIS — Z3403 Encounter for supervision of normal first pregnancy, third trimester: Secondary | ICD-10-CM

## 2017-08-23 LAB — POCT URINALYSIS DIPSTICK OB
Glucose, UA: NEGATIVE — AB
KETONES UA: NEGATIVE
NITRITE UA: NEGATIVE
RBC UA: NEGATIVE

## 2017-08-23 NOTE — Patient Instructions (Signed)
Anna [name], I greatly value your feedback.  If you receive a survey following your visit with us today, we appreciate you taking the time to fill it out.  Thanks, Joellyn HaffKim Capone Schwinn, CNM, WHNP-BC   Call the office [phone]) or go to [facility] if:  You begin to have strong, frequent contractions  Your water breaks.  Sometimes it is a big gush of fluid, sometimes it is just a trickle that keeps getting your panties wet or running down your legs  You have vaginal bleeding.  It is normal to have a small amount of spotting if your cervix was checked.   You don't feel your baby moving like normal.  If you don't, get you something to eat and drink and lay down and focus on feeling your baby move.  You should feel at least 10 movements in 2 hours.  If you don't, you should call the office or go to [facility].    Call the office [phone]) or go to [facility] for these signs of pre-eclampsia:  Severe headache that does not go away with Tylenol  Visual changes- seeing spots, double, blurred vision  Pain under your right breast or upper abdomen that does not go away with Tums or heartburn medicine  Nausea and/or vomiting  Severe swelling in your hands, feet, and face     Preterm Labor and Birth Information The normal length of a pregnancy is 39-41 weeks. Preterm labor is when labor starts before 37 completed weeks of pregnancy. What are the risk factors for preterm labor? Preterm labor is more likely to occur in women who:  Have certain infections during pregnancy such as a bladder infection, sexually transmitted infection, or infection inside the uterus (chorioamnionitis).  Have a shorter-than-normal cervix.  Have gone into preterm labor before.  Have had surgery on their cervix.  Are younger than age 22 or older than age 22.  Are African American.  Are pregnant with twins or multiple babies (multiple gestation).  Take street drugs or smoke while  pregnant.  Do not gain enough weight while pregnant.  Became pregnant shortly after having been pregnant.  What are the symptoms of preterm labor? Symptoms of preterm labor include:  Cramps similar to those that can happen during a menstrual period. The cramps may happen with diarrhea.  Pain in the abdomen or lower back.  Regular uterine contractions that may feel like tightening of the abdomen.  A feeling of increased pressure in the pelvis.  Increased watery or bloody mucus discharge from the vagina.  Water breaking (ruptured amniotic sac).  Why is it important to recognize signs of preterm labor? It is important to recognize signs of preterm labor because babies who are born prematurely may not be fully developed. This can put them at an increased risk for:  Long-term (chronic) heart and lung problems.  Difficulty immediately after birth with regulating body systems, including blood sugar, body temperature, heart rate, and breathing rate.  Bleeding in the brain.  Cerebral palsy.  Learning difficulties.  Death.  These risks are highest for babies who are born before 34 weeks of pregnancy. How is preterm labor treated? Treatment depends on the length of your pregnancy, your condition, and the health of your baby. It may involve:  Having a stitch (suture) placed in your cervix to prevent your cervix from opening too early (cerclage).  Taking or being given medicines, such as: ? Hormone medicines. These may be given early in pregnancy to help support  the pregnancy. ? Medicine to stop contractions. ? Medicines to help mature the baby's lungs. These may be prescribed if the risk of delivery is high. ? Medicines to prevent your baby from developing cerebral palsy.  If the labor happens before 34 weeks of pregnancy, you may need to stay in the hospital. What should I do if I think I am in preterm labor? If you think that you are going into preterm labor, call your health  care provider right away. How can I prevent preterm labor in future pregnancies? To increase your chance of having a full-term pregnancy:  Do not use any tobacco products, such as cigarettes, chewing tobacco, and e-cigarettes. If you need help quitting, ask your health care provider.  Do not use street drugs or medicines that have not been prescribed to you during your pregnancy.  Talk with your health care provider before taking any herbal supplements, even if you have been taking them regularly.  Make sure you gain a healthy amount of weight during your pregnancy.  Watch for infection. If you think that you might have an infection, get it checked right away.  Make sure to tell your health care provider if you have gone into preterm labor before.  This information is not intended to replace advice given to you by your health care provider. Make sure you discuss any questions you have with your health care provider. Document Released: 04/07/2003 Document Revised: 06/28/2015 Document Reviewed: 06/08/2015 Elsevier Interactive Patient Education  2018 [employer].

## 2017-08-23 NOTE — Progress Notes (Signed)
LOW-RISK PREGNANCY VISIT Patient name: Anna [name] MRN [id number]  Date of birth: 1995/02/23 Chief Complaint:   Routine Prenatal Visit  History of Present Illness:   Anna [name] is a 22 y.o. G1P0 female at 8068w1d with an Estimated Date of Delivery: 10/17/17 being seen today for ongoing management of a low-risk pregnancy.  Today she reports allergies are bothering her, started off w/ claritin dried her up too much, made nose bleed. Now on zyrtec, helped at first but not really now.  Denies ha, visual changes, ruq/epigastric pain, n/v.  States she was dx w/ HTN when younger- placed on meds, but then taken off b/c bp too low. No problems w/ bp since.  Contractions: Not present. Vag. Bleeding: None.  Movement: Present. denies leaking of fluid. Review of Systems:   Pertinent items are noted in HPI Denies abnormal vaginal discharge w/ itching/odor/irritation, headaches, visual changes, shortness of breath, chest pain, abdominal pain, severe nausea/vomiting, or problems with urination or bowel movements unless otherwise stated above. Pertinent History Reviewed:  Reviewed past medical,surgical, social, obstetrical and family history.  Reviewed problem list, medications and allergies. Physical Assessment:   Vitals:   08/23/17 1243  BP: 140/85  Pulse: 86  Weight: (!) 387 lb 9.6 oz (175.8 kg)  Body mass index is 70.89 kg/m.        Physical Examination:   General appearance: Well appearing, and in no distress  Mental status: Alert, oriented to person, place, and time  Skin: Warm & dry  Cardiovascular: Normal heart rate noted  Respiratory: Normal respiratory effort, no distress  Abdomen: Soft, gravid, nontender  Pelvic: Cervical exam deferred         Extremities: Edema: Trace  Fetal Status: Fetal Heart Rate (bpm): 150 Fundal Height: 29 cm from U Movement: Present    Results for orders placed or performed in visit on 08/23/17 (from the past 24 hour(s))  POC Urinalysis Dipstick OB    Collection Time: 08/23/17 12:44 PM  Result Value Ref Range   Color, UA     Clarity, UA     Glucose, UA Negative (A) (none)   Bilirubin, UA     Ketones, UA neg    Spec Grav, UA  1.010 - 1.025   Blood, UA neg    pH, UA  5.0 - 8.0   POC Protein UA Trace Negative, Trace   Urobilinogen, UA  0.2 or 1.0 E.U./dL   Nitrite, UA neg    Leukocytes, UA Moderate (2+) (A) Negative   Appearance     Odor      Assessment & Plan:  1) Low-risk pregnancy G1P0 at 9168w1d with an Estimated Date of Delivery: 10/17/17   2) Mildly elevated bp today, tr protein (has had intermittently), asymptomatic. ?CHTN when younger, started on meds but taken off b/c bp too low. This is 1st elevated bp this pregnancy. F/U Mon for bp recheck, will get labs then if still elevated. Reviewed pre-e warning s/s, reasons to seek care  3) Allergies> can go back to claritin, try netty pot  4) BMI 70   Meds: No orders of the defined types were placed in this encounter.  Labs/procedures today: none  Plan:  Continue routine obstetrical care   Reviewed: Preterm labor symptoms and general obstetric precautions including but not limited to vaginal bleeding, contractions, leaking of fluid and fetal movement were reviewed in detail with the patient.  All questions were answered  Follow-up: Return for Monday for LROB/bp check.  Orders Placed  This Encounter  Procedures  . POC Urinalysis Dipstick OB   Cheral Marker CNM, [facility] 08/23/2017 1:17 PM

## 2017-08-26 ENCOUNTER — Ambulatory Visit (INDEPENDENT_AMBULATORY_CARE_PROVIDER_SITE_OTHER): Payer: Federal, State, Local not specified - PPO | Admitting: Obstetrics and Gynecology

## 2017-08-26 VITALS — BP 143/87 | HR 86 | Wt 392.0 lb

## 2017-08-26 DIAGNOSIS — Z3403 Encounter for supervision of normal first pregnancy, third trimester: Secondary | ICD-10-CM

## 2017-08-26 DIAGNOSIS — Z331 Pregnant state, incidental: Secondary | ICD-10-CM

## 2017-08-26 DIAGNOSIS — Z1389 Encounter for screening for other disorder: Secondary | ICD-10-CM

## 2017-08-26 DIAGNOSIS — O139 Gestational [pregnancy-induced] hypertension without significant proteinuria, unspecified trimester: Secondary | ICD-10-CM

## 2017-08-26 DIAGNOSIS — Z3A32 32 weeks gestation of pregnancy: Secondary | ICD-10-CM

## 2017-08-26 LAB — POCT URINALYSIS DIPSTICK OB
GLUCOSE, UA: NEGATIVE — AB
KETONES UA: NEGATIVE
Leukocytes, UA: NEGATIVE
NITRITE UA: NEGATIVE
RBC UA: NEGATIVE

## 2017-08-26 NOTE — Progress Notes (Signed)
Patient ID: Anna [name], female   DOB: Aug 13, 1995, 22 y.o.   MRN: [id number]    LOW-RISK PREGNANCY VISIT Patient name: Anna [name] MRN [id number]  Date of birth: Aug 13, 1995 Chief Complaint:   Routine Prenatal Visit  History of Present Illness:   Anna [name] is a 22 y.o. G1P0 female at 7323w4d with an Estimated Date of Delivery: 10/17/17 being seen today for ongoing management of a low-risk pregnancy. She has small cramps that happen at times but does away and doesn't give her much trouble. This is her first pregnancy. Yesterday she said that she was going to go to the store but had changes in vision and was " seeing stars" but has not had any vision changes since. She tries to monitor her weight by changing her diet but has gained significant wait since 08/23/17. Today she reports no complaints. Contractions: Not present. Vag. Bleeding: None.  Movement: Present. denies leaking of fluid. Review of Systems:   Pertinent items are noted in HPI Denies abnormal vaginal discharge w/ itching/odor/irritation, headaches, visual changes, shortness of breath, chest pain, abdominal pain, severe nausea/vomiting, or problems with urination or bowel movements unless otherwise stated above. Pertinent History Reviewed:  Reviewed past medical,surgical, social, obstetrical and family history.  Reviewed problem list, medications and allergies. Physical Assessment:   Vitals:   08/26/17 1327  BP: (!) 143/87  Pulse: 86  Weight: (!) 392 lb (177.8 kg)  Body mass index is 71.7 kg/m.        Physical Examination:   General appearance: Well appearing, and in no distress  Mental status: Alert, oriented to person, place, and time  Skin: Warm & dry  Cardiovascular: Normal heart rate noted  Respiratory: Normal respiratory effort, no distress  Abdomen: Soft, gravid, nontender, U+24  Pelvic: Cervical exam deferred         Extremities: Edema: Trace  Fetal Status: Fetal Heart Rate (bpm): 140 Fundal  Height: 34 cm Movement: Present    Results for orders placed or performed in visit on 08/26/17 (from the past 24 hour(s))  POC Urinalysis Dipstick OB   Collection Time: 08/26/17  1:29 PM  Result Value Ref Range   Color, UA     Clarity, UA     Glucose, UA Negative (A) (none)   Bilirubin, UA     Ketones, UA negative    Spec Grav, UA  1.010 - 1.025   Blood, UA neg    pH, UA  5.0 - 8.0   POC Protein UA Small (1+) (A) Negative, Trace   Urobilinogen, UA  0.2 or 1.0 E.U./dL   Nitrite, UA neg    Leukocytes, UA Negative Negative   Appearance     Odor      Assessment & Plan:  1) gestational HTN in  G1P0 at 6023w4d  2 MORBID obesity Body mass index is 71.7 kg/m.     Meds: No orders of the defined types were placed in this encounter.  Labs/procedures today: None  Plan:  1.Blood work & 24 hours urine collection. 2. Follow up for in 2 days for BP check and NST 3. Recommended pool physical activity 4. Continue routine obstetrical care.   Follow-up: Return in about 2 weeks (around 09/09/2017).  Orders Placed This Encounter  Procedures  . POC Urinalysis Dipstick OB   By signing my name below, I, Arnette NorrisMari Johnson, attest that this documentation has been prepared under the direction and in the presence of Tilda BurrowFerguson, Lashea Goda V, MD. Electronically Signed: Arnette NorrisMari Johnson  Medical Scribe. 08/26/17. 1:52 PM.  I personally performed the services described in this documentation, which was SCRIBED in my presence. The recorded information has been reviewed and considered accurate. It has been edited as necessary during review. Tilda Burrow, MD

## 2017-08-27 ENCOUNTER — Telehealth: Payer: Self-pay | Admitting: Obstetrics and Gynecology

## 2017-08-27 LAB — COMPREHENSIVE METABOLIC PANEL
A/G RATIO: 1.1 — AB (ref 1.2–2.2)
ALBUMIN: 3.2 g/dL — AB (ref 3.5–5.5)
ALK PHOS: 71 IU/L (ref 39–117)
ALT: 39 IU/L — ABNORMAL HIGH (ref 0–32)
AST: 21 IU/L (ref 0–40)
BUN/Creatinine Ratio: 22 (ref 9–23)
BUN: 8 mg/dL (ref 6–20)
CHLORIDE: 105 mmol/L (ref 96–106)
CO2: 19 mmol/L — ABNORMAL LOW (ref 20–29)
Calcium: 8.9 mg/dL (ref 8.7–10.2)
Creatinine, Ser: 0.37 mg/dL — ABNORMAL LOW (ref 0.57–1.00)
GFR calc Af Amer: 175 mL/min/{1.73_m2} (ref >59)
GFR calc non Af Amer: 152 mL/min/{1.73_m2} (ref >59)
GLOBULIN, TOTAL: 2.8 g/dL (ref 1.5–4.5)
Glucose: 88 mg/dL (ref 65–99)
POTASSIUM: 4.4 mmol/L (ref 3.5–5.2)
Sodium: 139 mmol/L (ref 134–144)
Total Protein: 6 g/dL (ref 6.0–8.5)

## 2017-08-27 LAB — CBC
HEMATOCRIT: 31.2 % — AB (ref 34.0–46.6)
HEMOGLOBIN: 9.7 g/dL — AB (ref 11.1–15.9)
MCH: 24.1 pg — AB (ref 26.6–33.0)
MCHC: 31.1 g/dL — AB (ref 31.5–35.7)
MCV: 77 fL — AB (ref 79–97)
Platelets: 282 10*3/uL (ref 150–450)
RBC: 4.03 x10E6/uL (ref 3.77–5.28)
RDW: 14.5 % (ref 12.3–15.4)
WBC: 6.6 10*3/uL (ref 3.4–10.8)

## 2017-08-27 LAB — PROTEIN / CREATININE RATIO, URINE
CREATININE, UR: 148.2 mg/dL
PROTEIN UR: 36.8 mg/dL
PROTEIN/CREAT RATIO: 248 mg/g{creat} — AB (ref 0–200)

## 2017-08-27 NOTE — Telephone Encounter (Signed)
Phone call to pt :  Labs from Monday reviewed: CBC    Component Value Date/Time   WBC 6.6 08/26/2017 1414   RBC 4.03 08/26/2017 1414   HGB 9.7 (L) 08/26/2017 1414   HCT 31.2 (L) 08/26/2017 1414   PLT 282 08/26/2017 1414   MCV 77 (L) 08/26/2017 1414   MCH 24.1 (L) 08/26/2017 1414   MCHC 31.1 (L) 08/26/2017 1414   RDW 14.5 08/26/2017 1414   LYMPHSABS 2.0 03/21/2017 1522   EOSABS 0.1 03/21/2017 1522   BASOSABS 0.0 03/21/2017 1522    Platelets stable CMP Latest Ref Rng & Units 08/26/2017 12/04/2016  Glucose 65 - 99 mg/dL 88 87  BUN 6 - 20 mg/dL 8 10  Creatinine 1.610.57 - 1.00 mg/dL 0.96(E0.37(L) 4.540.63  Sodium 098134 - 144 mmol/L 139 140  Potassium 3.5 - 5.2 mmol/L 4.4 4.2  Chloride 96 - 106 mmol/L 105 103  CO2 20 - 29 mmol/L 19(L) 23  Calcium 8.7 - 10.2 mg/dL 8.9 9.0  Total Protein 6.0 - 8.5 g/dL 6.0 6.9  Total Bilirubin 0.0 - 1.2 mg/dL <1.1<0.2 0.4  Alkaline Phos 39 - 117 IU/L 71 53  AST 0 - 40 IU/L 21 19  ALT 0 - 32 IU/L 39(H) 25   ALT slight elevation  Pt had PR/CR 0.28 Pt collecting 24 hr TP now, will deliver to lab in a.m Pt notes increasing swelling, and fluid retention. Denies headache or scotoma or ruq pain. Will see in a.m for bp check, consider betamethasone dose.

## 2017-08-28 ENCOUNTER — Encounter: Payer: Self-pay | Admitting: *Deleted

## 2017-08-28 ENCOUNTER — Other Ambulatory Visit: Payer: Self-pay | Admitting: *Deleted

## 2017-08-28 ENCOUNTER — Ambulatory Visit: Payer: Federal, State, Local not specified - PPO

## 2017-08-28 DIAGNOSIS — Z0130 Encounter for examination of blood pressure without abnormal findings: Secondary | ICD-10-CM | POA: Insufficient documentation

## 2017-08-28 DIAGNOSIS — O139 Gestational [pregnancy-induced] hypertension without significant proteinuria, unspecified trimester: Secondary | ICD-10-CM

## 2017-08-28 NOTE — Progress Notes (Signed)
Pt here for blood pressure 137/65. On 08-26-17 was 143/87. Spoke Dr.Ferguson about result. It okay, keep appointment for tomorrow.Pad CMA

## 2017-08-29 ENCOUNTER — Encounter: Payer: Self-pay | Admitting: Advanced Practice Midwife

## 2017-08-29 ENCOUNTER — Ambulatory Visit (INDEPENDENT_AMBULATORY_CARE_PROVIDER_SITE_OTHER): Payer: Federal, State, Local not specified - PPO | Admitting: Advanced Practice Midwife

## 2017-08-29 ENCOUNTER — Other Ambulatory Visit: Payer: Federal, State, Local not specified - PPO | Admitting: Women's Health

## 2017-08-29 ENCOUNTER — Other Ambulatory Visit: Payer: Self-pay

## 2017-08-29 VITALS — BP 122/72 | HR 88 | Wt 391.0 lb

## 2017-08-29 DIAGNOSIS — Z6841 Body Mass Index (BMI) 40.0 and over, adult: Secondary | ICD-10-CM

## 2017-08-29 DIAGNOSIS — O133 Gestational [pregnancy-induced] hypertension without significant proteinuria, third trimester: Secondary | ICD-10-CM

## 2017-08-29 DIAGNOSIS — Z3A33 33 weeks gestation of pregnancy: Secondary | ICD-10-CM

## 2017-08-29 DIAGNOSIS — O139 Gestational [pregnancy-induced] hypertension without significant proteinuria, unspecified trimester: Secondary | ICD-10-CM | POA: Insufficient documentation

## 2017-08-29 DIAGNOSIS — Z1389 Encounter for screening for other disorder: Secondary | ICD-10-CM

## 2017-08-29 DIAGNOSIS — Z8679 Personal history of other diseases of the circulatory system: Secondary | ICD-10-CM | POA: Insufficient documentation

## 2017-08-29 DIAGNOSIS — O0993 Supervision of high risk pregnancy, unspecified, third trimester: Secondary | ICD-10-CM

## 2017-08-29 DIAGNOSIS — Z331 Pregnant state, incidental: Secondary | ICD-10-CM

## 2017-08-29 DIAGNOSIS — O10919 Unspecified pre-existing hypertension complicating pregnancy, unspecified trimester: Secondary | ICD-10-CM | POA: Insufficient documentation

## 2017-08-29 LAB — POCT URINALYSIS DIPSTICK OB
Blood, UA: NEGATIVE
GLUCOSE, UA: NEGATIVE — AB
Ketones, UA: NEGATIVE
Nitrite, UA: NEGATIVE

## 2017-08-29 LAB — PROTEIN, URINE, 24 HOUR
Protein, 24H Urine: 354 mg/24 hr — ABNORMAL HIGH (ref 30–150)
Protein, Ur: 23.6 mg/dL

## 2017-08-29 NOTE — Progress Notes (Signed)
HIGH-RISK PREGNANCY VISIT Patient name: Anna [name] MRN [id number]  Date of birth: [birth year] Chief Complaint:   High Risk Gestation (NST)  History of Present Illness:   Anna [name] is a 22 y.o. G1P0 female at 7571w0d with an Estimated Date of Delivery: 10/17/17 being seen today for ongoing management of a high-risk pregnancy complicated by gestational HTN. , dx this week.  Labs essentially normal (insignificant elevation in ALT).  24 hour urine pending, PR/cr ration .283.  Today's BP is normal.  Today she reports no complaints. Contractions: Not present. Vag. Bleeding: None.  Movement: Present. denies leaking of fluid.  Review of Systems:   Pertinent items are noted in HPI Denies abnormal vaginal discharge w/ itching/odor/irritation, headaches, visual changes, shortness of breath, chest pain, abdominal pain, severe nausea/vomiting, or problems with urination or bowel movements unless otherwise stated above.    Pertinent History Reviewed:  Medical & Surgical Hx:   Past Medical History:  Diagnosis Date  . Allergy   . GERD (gastroesophageal reflux disease)    Past Surgical History:  Procedure Laterality Date  . MYRINGOTOMY    . TONSILLECTOMY AND ADENOIDECTOMY     Family History  Problem Relation Age of Onset  . Asthma Mother   . Anemia Mother   . Hypertension Father   . Cancer Maternal Grandfather   . Hypertension Paternal Grandmother   . Heart failure Paternal Grandmother     Current Outpatient Medications:  .  cetirizine (ZYRTEC) 5 MG tablet, Take 1 tablet (5 mg total) by mouth daily., Disp: 30 tablet, Rfl: 3 .  ferrous sulfate 325 (65 FE) MG tablet, Take 325 mg by mouth 2 (two) times daily with a meal., Disp: , Rfl:  .  pantoprazole (PROTONIX) 20 MG tablet, Take 1 tablet (20 mg total) by mouth daily., Disp: 30 tablet, Rfl: 6 .  Prenatal MV & Min w/FA-DHA (PRENATAL ADULT GUMMY/DHA/FA) 0.4-25 MG CHEW, Chew 1 tablet by mouth daily., Disp: 30 tablet, Rfl: 11 Social  History: Reviewed -  reports that she has never smoked. She has never used smokeless tobacco.   Physical Assessment:   Vitals:   08/29/17 1016  BP: 122/72  Pulse: 88  Weight: (!) 391 lb (177.4 kg)  Body mass index is 76.36 kg/m.           Physical Examination:   General appearance: alert, well appearing, and in no distress  Mental status: alert, oriented to person, place, and time  Skin: warm & dry   Extremities: Edema: Trace    Cardiovascular: normal heart rate noted  Respiratory: normal respiratory effort, no distress  Abdomen: gravid, soft, non-tender  Pelvic: Cervical exam deferred         Fetal Status:     Movement: Present    Fetal Surveillance Testing today: NST: FHR baseline 140 bpm, Variability: moderate, Accelerations:present, Decelerations:  Absent= Cat 1/Reactive   Results for orders placed or performed in visit on 08/29/17 (from the past 24 hour(s))  POC Urinalysis Dipstick OB   Collection Time: 08/29/17 10:17 AM  Result Value Ref Range   Color, UA     Clarity, UA     Glucose, UA Negative (A) (none)   Bilirubin, UA     Ketones, UA neg    Spec Grav, UA  1.010 - 1.025   Blood, UA neg    pH, UA  5.0 - 8.0   POC Protein UA Trace Negative, Trace   Urobilinogen, UA  0.2 or 1.0 E.U./dL  Nitrite, UA neg    Leukocytes, UA Moderate (2+) (A) Negative   Appearance     Odor      Assessment & Plan:  1) High-risk pregnancy G1P0 at [redacted]w[redacted]d with an Estimated Date of Delivery: 10/17/17   2) GHTN, stable; discussed w/LHE:  Will do NSTs only unless BP increases;  w/growth [place] q 3 weeks (also d/t morbid obesity)   Follow-up: Return in about 1 week (around 09/05/2017) for BPP/EFW/HROB.  Orders Placed This Encounter  Procedures  . US OB Follow Up  . US FETAL BPP WO NON STRESS  . POC Urinalysis Dipstick OB   Jacklyn Shell CNM 08/29/2017 10:54 AM

## 2017-08-30 ENCOUNTER — Telehealth: Payer: Self-pay | Admitting: Obstetrics and Gynecology

## 2017-08-30 NOTE — Telephone Encounter (Signed)
Pt made aware of labs and doing well, going to pool which helps with swelling. Denies h/a. Vision changes.

## 2017-09-04 ENCOUNTER — Other Ambulatory Visit: Payer: Self-pay | Admitting: Advanced Practice Midwife

## 2017-09-04 ENCOUNTER — Encounter: Payer: Self-pay | Admitting: Advanced Practice Midwife

## 2017-09-04 ENCOUNTER — Other Ambulatory Visit: Payer: Self-pay

## 2017-09-04 ENCOUNTER — Ambulatory Visit (INDEPENDENT_AMBULATORY_CARE_PROVIDER_SITE_OTHER): Payer: Federal, State, Local not specified - PPO

## 2017-09-04 ENCOUNTER — Ambulatory Visit (INDEPENDENT_AMBULATORY_CARE_PROVIDER_SITE_OTHER): Payer: Federal, State, Local not specified - PPO | Admitting: Advanced Practice Midwife

## 2017-09-04 VITALS — BP 130/73 | HR 91 | Wt 391.0 lb

## 2017-09-04 DIAGNOSIS — O0993 Supervision of high risk pregnancy, unspecified, third trimester: Secondary | ICD-10-CM

## 2017-09-04 DIAGNOSIS — Z6841 Body Mass Index (BMI) 40.0 and over, adult: Secondary | ICD-10-CM

## 2017-09-04 DIAGNOSIS — O133 Gestational [pregnancy-induced] hypertension without significant proteinuria, third trimester: Secondary | ICD-10-CM | POA: Diagnosis not present

## 2017-09-04 DIAGNOSIS — Z1389 Encounter for screening for other disorder: Secondary | ICD-10-CM

## 2017-09-04 DIAGNOSIS — Z331 Pregnant state, incidental: Secondary | ICD-10-CM

## 2017-09-04 DIAGNOSIS — Z3A33 33 weeks gestation of pregnancy: Secondary | ICD-10-CM

## 2017-09-04 LAB — POCT URINALYSIS DIPSTICK OB
Glucose, UA: NEGATIVE — AB
Ketones, UA: NEGATIVE
NITRITE UA: NEGATIVE
PROTEIN: NEGATIVE
RBC UA: NEGATIVE

## 2017-09-04 NOTE — Progress Notes (Signed)
US 33+6 wks,cephalic,posterior pl gr 2,cx 4 cm,afi 19 cm,fhr 161 bpm,EFW 2665 g 85%,AC 97%,RI .65,.62,.62=67%,BPP 8/8

## 2017-09-04 NOTE — Patient Instructions (Signed)

## 2017-09-04 NOTE — Progress Notes (Signed)
HIGH-RISK PREGNANCY VISIT Patient name: Anna [name] MRN [id number]  Date of birth: January 26, 1996 Chief Complaint:   High Risk Gestation (u/s today)  History of Present Illness:   Anna [name] is a 22 y.o. G1P0 female at 51w6dwith an Estimated Date of Delivery: 10/17/17 being seen today for ongoing management of a high-risk pregnancy complicated by gestational HTN. She met criteria a few weeks ago w/mildly elevated BP, they have been normal since then. However, her 24 hour urine protein was 3516mlast week w/negative dips since then.  Today she reports no complaints. Contractions: Not present. Vag. Bleeding: None.  Movement: Present. denies leaking of fluid.  Review of Systems:   Pertinent items are noted in HPI Denies abnormal vaginal discharge w/ itching/odor/irritation, headaches, visual changes, shortness of breath, chest pain, abdominal pain, severe nausea/vomiting, or problems with urination or bowel movements unless otherwise stated above.    Pertinent History Reviewed:  Medical & Surgical Hx:   Past Medical History:  Diagnosis Date  . Allergy   . GERD (gastroesophageal reflux disease)    Past Surgical History:  Procedure Laterality Date  . MYRINGOTOMY    . TONSILLECTOMY AND ADENOIDECTOMY     Family History  Problem Relation Age of Onset  . Asthma Mother   . Anemia Mother   . Hypertension Father   . Cancer Maternal Grandfather   . Hypertension Paternal Grandmother   . Heart failure Paternal Grandmother     Current Outpatient Medications:  .  cetirizine (ZYRTEC) 5 MG tablet, Take 1 tablet (5 mg total) by mouth daily., Disp: 30 tablet, Rfl: 3 .  ferrous sulfate 325 (65 FE) MG tablet, Take 325 mg by mouth 2 (two) times daily with a meal., Disp: , Rfl:  .  pantoprazole (PROTONIX) 20 MG tablet, Take 1 tablet (20 mg total) by mouth daily., Disp: 30 tablet, Rfl: 6 .  Prenatal MV & Min w/FA-DHA (PRENATAL ADULT GUMMY/DHA/FA) 0.4-25 MG CHEW, Chew 1 tablet by mouth daily.,  Disp: 30 tablet, Rfl: 11 Social History: Reviewed -  reports that she has never smoked. She has never used smokeless tobacco.   Physical Assessment:   Vitals:   09/04/17 1207  BP: 130/73  Pulse: 91  Weight: (!) 391 lb (177.4 kg)  Body mass index is 76.36 kg/m.           Physical Examination:   General appearance: alert, well appearing, and in no distress  Mental status: alert, oriented to person, place, and time  Skin: warm & dry   Extremities: Edema: Trace    Cardiovascular: normal heart rate noted  Respiratory: normal respiratory effort, no distress  Abdomen: gravid, soft, non-tender  Pelvic: Cervical exam deferred         Fetal Status:     Movement: Present    Fetal Surveillance Testing today: BPP/dopplers  Results for orders placed or performed in visit on 09/04/17 (from the past 24 hour(s))  POC Urinalysis Dipstick OB   Collection Time: 09/04/17 12:08 PM  Result Value Ref Range   Color, UA     Clarity, UA     Glucose, UA Negative (A) (none)   Bilirubin, UA     Ketones, UA neg    Spec Grav, UA  1.010 - 1.025   Blood, UA neg    pH, UA  5.0 - 8.0   POC Protein UA Negative Negative, Trace   Urobilinogen, UA  0.2 or 1.0 E.U./dL   Nitrite, UA neg    Leukocytes,  UA Moderate (2+) (A) Negative   Appearance     Odor      Assessment & Plan:  1) High-risk pregnancy G1P0 at [redacted]w[redacted]d with an Estimated Date of Delivery: 10/17/17   2) GHTN, stable  3) morbid obesity, stable  Labs/procedures today: US 33+6 wks,cephalic,posterior pl gr 2,cx 4 cm,afi 19 cm,fhr 161 bpm,EFW 2665 g 85%,AC 97%,RI .65,.62,.62=67%,BPP 8/8    Medications: [id number]  Treatment Plan:  Twice weekly NSTs unless BP ^, then add US; growth US q 3 weeks; IOL 37-39 weeks  Follow-up: Return for MOndays forNST/HROB;  Thursday :  NST only w/nurse;  8/29 for EFW/BPP/HROB.  Orders Placed This Encounter  Procedures  . POC Urinalysis Dipstick OB    Cresenzo-Dishmon CNM 09/04/2017 1:21 PM   

## 2017-09-08 ENCOUNTER — Inpatient Hospital Stay (HOSPITAL_COMMUNITY)
Admission: AD | Admit: 2017-09-08 | Discharge: 2017-09-08 | Disposition: A | Discharge status: Home / Self Care | Payer: Federal, State, Local not specified - PPO | Source: Ambulatory Visit | Attending: Family Medicine | Admitting: Family Medicine

## 2017-09-08 ENCOUNTER — Encounter (HOSPITAL_COMMUNITY): Payer: Self-pay | Admitting: *Deleted

## 2017-09-08 DIAGNOSIS — R51 Headache: Secondary | ICD-10-CM | POA: Insufficient documentation

## 2017-09-08 DIAGNOSIS — O26893 Other specified pregnancy related conditions, third trimester: Secondary | ICD-10-CM | POA: Diagnosis not present

## 2017-09-08 DIAGNOSIS — K219 Gastro-esophageal reflux disease without esophagitis: Secondary | ICD-10-CM | POA: Insufficient documentation

## 2017-09-08 DIAGNOSIS — O99613 Diseases of the digestive system complicating pregnancy, third trimester: Secondary | ICD-10-CM | POA: Diagnosis not present

## 2017-09-08 DIAGNOSIS — R110 Nausea: Secondary | ICD-10-CM

## 2017-09-08 DIAGNOSIS — O212 Late vomiting of pregnancy: Secondary | ICD-10-CM | POA: Diagnosis not present

## 2017-09-08 DIAGNOSIS — Z3A34 34 weeks gestation of pregnancy: Secondary | ICD-10-CM | POA: Diagnosis not present

## 2017-09-08 DIAGNOSIS — R519 Headache, unspecified: Secondary | ICD-10-CM

## 2017-09-08 DIAGNOSIS — O133 Gestational [pregnancy-induced] hypertension without significant proteinuria, third trimester: Secondary | ICD-10-CM

## 2017-09-08 LAB — URINALYSIS, ROUTINE W REFLEX MICROSCOPIC
Bilirubin Urine: NEGATIVE
GLUCOSE, UA: NEGATIVE mg/dL
Ketones, ur: NEGATIVE mg/dL
NITRITE: NEGATIVE
PH: 6 (ref 5.0–8.0)
Protein, ur: NEGATIVE mg/dL
SPECIFIC GRAVITY, URINE: 1.018 (ref 1.005–1.030)

## 2017-09-08 LAB — CBC
HEMATOCRIT: 29.4 % — AB (ref 36.0–46.0)
HEMOGLOBIN: 9.4 g/dL — AB (ref 12.0–15.0)
MCH: 25.3 pg — ABNORMAL LOW (ref 26.0–34.0)
MCHC: 32 g/dL (ref 30.0–36.0)
MCV: 79.2 fL (ref 78.0–100.0)
Platelets: 237 10*3/uL (ref 150–400)
RBC: 3.71 MIL/uL — ABNORMAL LOW (ref 3.87–5.11)
RDW: 14.2 % (ref 11.5–15.5)
WBC: 6.2 10*3/uL (ref 4.0–10.5)

## 2017-09-08 LAB — COMPREHENSIVE METABOLIC PANEL
ALBUMIN: 2.4 g/dL — AB (ref 3.5–5.0)
ALT: 43 U/L (ref 0–44)
ANION GAP: 8 (ref 5–15)
AST: 22 U/L (ref 15–41)
Alkaline Phosphatase: 65 U/L (ref 38–126)
BILIRUBIN TOTAL: 0.6 mg/dL (ref 0.3–1.2)
BUN: 8 mg/dL (ref 6–20)
CO2: 21 mmol/L — AB (ref 22–32)
Calcium: 8.5 mg/dL — ABNORMAL LOW (ref 8.9–10.3)
Chloride: 106 mmol/L (ref 98–111)
Creatinine, Ser: 0.53 mg/dL (ref 0.44–1.00)
GFR calc Af Amer: >60 mL/min (ref >60)
GFR calc non Af Amer: >60 mL/min (ref >60)
GLUCOSE: 100 mg/dL — AB (ref 70–99)
POTASSIUM: 3.8 mmol/L (ref 3.5–5.1)
SODIUM: 135 mmol/L (ref 135–145)
Total Protein: 6.1 g/dL — ABNORMAL LOW (ref 6.5–8.1)

## 2017-09-08 LAB — PROTEIN / CREATININE RATIO, URINE
Creatinine, Urine: 165 mg/dL
PROTEIN CREATININE RATIO: 0.14 mg/mg{creat} (ref 0.00–0.15)
Total Protein, Urine: 23 mg/dL

## 2017-09-08 MED ORDER — CYCLOBENZAPRINE HCL 10 MG PO TABS
10.0000 mg | ORAL_TABLET | Freq: Once | ORAL | Status: AC
  Administered 2017-09-08: 10 mg via ORAL
  Filled 2017-09-08: qty 1

## 2017-09-08 MED ORDER — ACETAMINOPHEN 500 MG PO TABS
1000.0000 mg | ORAL_TABLET | Freq: Once | ORAL | Status: AC
  Administered 2017-09-08: 1000 mg via ORAL
  Filled 2017-09-08: qty 2

## 2017-09-08 MED ORDER — PROMETHAZINE HCL 25 MG PO TABS
12.5000 mg | ORAL_TABLET | Freq: Once | ORAL | Status: AC
  Administered 2017-09-08: 12.5 mg via ORAL
  Filled 2017-09-08: qty 1

## 2017-09-08 MED ORDER — CYCLOBENZAPRINE HCL 10 MG PO TABS: 10.0000 mg | ORAL_TABLET | Freq: Two times a day (BID) | ORAL | 0 refills | Status: DC | PRN

## 2017-09-08 NOTE — Progress Notes (Signed)
Baby very active 

## 2017-09-08 NOTE — Discharge Instructions (Signed)
Preeclampsia and Eclampsia °Preeclampsia is a serious condition that develops only during pregnancy. It is also called toxemia of pregnancy. This condition causes high blood pressure along with other symptoms, such as swelling and headaches. These symptoms may develop as the condition gets worse. Preeclampsia may occur at 20 weeks of pregnancy or later. °Diagnosing and treating preeclampsia early is very important. If not treated early, it can cause serious problems for you and your baby. One problem it can lead to is eclampsia, which is a condition that causes muscle jerking or shaking (convulsions or seizures) in the mother. Delivering your baby is the best treatment for preeclampsia or eclampsia. Preeclampsia and eclampsia symptoms usually go away after your baby is born. °What are the causes? °The cause of preeclampsia is not known. °What increases the risk? °The following risk factors make you more likely to develop preeclampsia: °· Being pregnant for the first time. °· Having had preeclampsia during a past pregnancy. °· Having a family history of preeclampsia. °· Having high blood pressure. °· Being pregnant with twins or triplets. °· Being 35 or older. °· Being African-American. °· Having kidney disease or diabetes. °· Having medical conditions such as lupus or blood diseases. °· Being very overweight (obese). ° °What are the signs or symptoms? °The earliest signs of preeclampsia are: °· High blood pressure. °· Increased protein in your urine. Your health care provider will check for this at every visit before you give birth (prenatal visit). ° °Other symptoms that may develop as the condition gets worse include: °· Severe headaches. °· Sudden weight gain. °· Swelling of the hands, face, legs, and feet. °· Nausea and vomiting. °· Vision problems, such as blurred or double vision. °· Numbness in the face, arms, legs, and feet. °· Urinating less than usual. °· Dizziness. °· Slurred speech. °· Abdominal pain,  especially upper abdominal pain. °· Convulsions or seizures. ° °Symptoms generally go away after giving birth. °How is this diagnosed? °There are no screening tests for preeclampsia. Your health care provider will ask you about symptoms and check for signs of preeclampsia during your prenatal visits. You may also have tests that include: °· Urine tests. °· Blood tests. °· Checking your blood pressure. °· Monitoring your baby’s heart rate. °· Ultrasound. ° °How is this treated? °You and your health care provider will determine the treatment approach that is best for you. Treatment may include: °· Having more frequent prenatal exams to check for signs of preeclampsia, if you have an increased risk for preeclampsia. °· Bed rest. °· Reducing how much salt (sodium) you eat. °· Medicine to lower your blood pressure. °· Staying in the hospital, if your condition is severe. There, treatment will focus on controlling your blood pressure and the amount of fluids in your body (fluid retention). °· You may need to take medicine (magnesium sulfate) to prevent seizures. This medicine may be given as an injection or through an IV tube. °· Delivering your baby early, if your condition gets worse. You may have your labor started with medicine (induced), or you may have a cesarean delivery. ° °Follow these instructions at home: °Eating and drinking ° °· Drink enough fluid to keep your urine clear or pale yellow. °· Eat a healthy diet that is low in sodium. Do not add salt to your food. Check nutrition labels to see how much sodium a food or beverage contains. °· Avoid caffeine. °Lifestyle °· Do not use any products that contain nicotine or tobacco, such as cigarettes   and e-cigarettes. If you need help quitting, ask your health care provider.  Do not use alcohol or drugs.  Avoid stress as much as possible. Rest and get plenty of sleep. General instructions  Take over-the-counter and prescription medicines only as told by your  health care provider.  When lying down, lie on your side. This keeps pressure off of your baby.  When sitting or lying down, raise (elevate) your feet. Try putting some pillows underneath your lower legs.  Exercise regularly. Ask your health care provider what kinds of exercise are best for you.  Keep all follow-up and prenatal visits as told by your health care provider. This is important. How is this prevented? To prevent preeclampsia or eclampsia from developing during another pregnancy:  Get proper medical care during pregnancy. Your health care provider may be able to prevent preeclampsia or diagnose and treat it early.  Your health care provider may have you take a low-dose aspirin or a calcium supplement during your next pregnancy.  You may have tests of your blood pressure and kidney function after giving birth.  Maintain a healthy weight. Ask your health care provider for help managing weight gain during pregnancy.  Work with your health care provider to manage any long-term (chronic) health conditions you have, such as diabetes or kidney problems.  Contact a health care provider if:  You gain more weight than expected.  You have headaches.  You have nausea or vomiting.  You have abdominal pain.  You feel dizzy or light-headed. Get help right away if:  You develop sudden or severe swelling anywhere in your body. This usually happens in the legs.  You gain 5 lbs (2.3 kg) or more during one week.  You have severe: ? Abdominal pain. ? Headaches. ? Dizziness. ? Vision problems. ? Confusion. ? Nausea or vomiting.  You have a seizure.  You have trouble moving any part of your body.  You develop numbness in any part of your body.  You have trouble speaking.  You have any abnormal bleeding.  You pass out. This information is not intended to replace advice given to you by your health care provider. Make sure you discuss any questions you have with your health  care provider. Document Released: 01/13/2000 Document Revised: 09/13/2015 Document Reviewed: 08/22/2015 Elsevier Interactive Patient Education  2018 [employer] General Headache Without Cause A headache is pain or discomfort felt around the head or neck area. There are many causes and types of headaches. In some cases, the cause may not be found. Follow these instructions at home: Managing pain  Take over-the-counter and prescription medicines only as told by your doctor.  Lie down in a dark, quiet room when you have a headache.  If directed, apply ice to the head and neck area: ? Put ice in a plastic bag. ? Place a towel between your skin and the bag. ? Leave the ice on for 20 minutes, 2-3 times per day.  Use a heating pad or hot shower to apply heat to the head and neck area as told by your doctor.  Keep lights dim if bright lights bother you or make your headaches worse. Eating and drinking  Eat meals on a regular schedule.  Lessen how much alcohol you drink.  Lessen how much caffeine you drink, or stop drinking caffeine. General instructions  Keep all follow-up visits as told by your doctor. This is important.  Keep a journal to find out if certain things bring on headaches.  For example, write down: ? What you eat and drink. ? How much sleep you get. ? Any change to your diet or medicines.  Relax by getting a massage or doing other relaxing activities.  Lessen stress.  Sit up straight. Do not tighten (tense) your muscles.  Do not use tobacco products. This includes cigarettes, chewing tobacco, or e-cigarettes. If you need help quitting, ask your doctor.  Exercise regularly as told by your doctor.  Get enough sleep. This often means 7-9 hours of sleep. Contact a doctor if:  Your symptoms are not helped by medicine.  You have a headache that feels different than the other headaches.  You feel sick to your stomach (nauseous) or you throw up (vomit).  You  have a fever. Get help right away if:  Your headache becomes really bad.  You keep throwing up.  You have a stiff neck.  You have trouble seeing.  You have trouble speaking.  You have pain in the eye or ear.  Your muscles are weak or you lose muscle control.  You lose your balance or have trouble walking.  You feel like you will pass out (faint) or you pass out.  You have confusion. This information is not intended to replace advice given to you by your health care provider. Make sure you discuss any questions you have with your health care provider. Document Released: 10/25/2007 Document Revised: 06/23/2015 Document Reviewed: 05/10/2014 Elsevier Interactive Patient Education  [employer].

## 2017-09-08 NOTE — Progress Notes (Addendum)
Difficulty tracing FHR due to pt's habitus, FM, and pt's position changes

## 2017-09-08 NOTE — Progress Notes (Signed)
Pt had turned to L lateral and not picking up FHR or ctx. Adjusted

## 2017-09-08 NOTE — MAU Provider Note (Signed)
History     CSN: [id number]  Arrival date and time: 09/08/17 0335   First Provider Initiated Contact with Patient 09/08/17 0422      Chief Complaint  Patient presents with  . Nausea  . Headache   HPI   [name] is a 22 y.o. female G1P0 @ 90w3dhere in MAU with complaints of Headache and nausea. Symptoms started this morning; symptoms woke her up out of her sleep. Says the HA is in the front of her head. She has not taken anything for the pain. Says she tried laying back down but the HA would not go away. + nausea, no vomiting. Has been in the office over the last few weeks having her BP monitored. + feal movement.   OB History    Gravida  1   Para      Term      Preterm      AB      Living        SAB      TAB      Ectopic      Multiple      Live Births              Past Medical History:  Diagnosis Date  . Allergy   . GERD (gastroesophageal reflux disease)   . Hypertension     Past Surgical History:  Procedure Laterality Date  . MYRINGOTOMY    . TONSILLECTOMY AND ADENOIDECTOMY      Family History  Problem Relation Age of Onset  . Asthma Mother   . Anemia Mother   . Hypertension Father   . Cancer Maternal Grandfather   . Hypertension Paternal Grandmother   . Heart failure Paternal Grandmother     Social History   Tobacco Use  . Smoking status: Never Smoker  . Smokeless tobacco: Never Used  Substance Use Topics  . Alcohol use: No  . Drug use: No    Allergies:  Allergies  Allergen Reactions  . Diflucan [Fluconazole] Rash    Medications Prior to Admission  Medication Sig Dispense Refill Last Dose  . cetirizine (ZYRTEC) 5 MG tablet Take 1 tablet (5 mg total) by mouth daily. 30 tablet 3 09/07/2017 at Unknown time  . ferrous sulfate 325 (65 FE) MG tablet Take 325 mg by mouth 2 (two) times daily with a meal.   09/07/2017 at Unknown time  . pantoprazole (PROTONIX) 20 MG tablet Take 1 tablet (20 mg total) by mouth daily. 30  tablet 6 09/07/2017 at Unknown time  . Prenatal MV & Min w/FA-DHA (PRENATAL ADULT GUMMY/DHA/FA) 0.4-25 MG CHEW Chew 1 tablet by mouth daily. 30 tablet 11 09/07/2017 at Unknown time   Results for orders placed or performed during the hospital encounter of 09/08/17 (from the past 48 hour(s))  Urinalysis, Routine w reflex microscopic     Status: Abnormal   Collection Time: 09/08/17  3:55 AM  Result Value Ref Range   Color, Urine YELLOW YELLOW   APPearance HAZY (A) CLEAR   Specific Gravity, Urine 1.018 1.005 - 1.030   pH 6.0 5.0 - 8.0   Glucose, UA NEGATIVE NEGATIVE mg/dL   Hgb urine dipstick SMALL (A) NEGATIVE   Bilirubin Urine NEGATIVE NEGATIVE   Ketones, ur NEGATIVE NEGATIVE mg/dL   Protein, ur NEGATIVE NEGATIVE mg/dL   Nitrite NEGATIVE NEGATIVE   Leukocytes, UA LARGE (A) NEGATIVE   RBC / HPF 11-20 0 - 5 RBC/hpf   WBC, UA 21-50 0 -  5 WBC/hpf   Bacteria, UA RARE (A) NONE SEEN   Squamous Epithelial / LPF 6-10 0 - 5   Mucus PRESENT     Comment: Performed at [facility], [address], [place], [place] [address]  Protein / creatinine ratio, urine     Status: None   Collection Time: 09/08/17  3:55 AM  Result Value Ref Range   Creatinine, Urine 165.00 mg/dL   Total Protein, Urine 23 mg/dL    Comment: NO NORMAL RANGE ESTABLISHED FOR THIS TEST   Protein Creatinine Ratio 0.14 0.00 - 0.15 mg/mg[Cre]    Comment: Performed at [facility], [address] [place], [place] [address]  CBC     Status: Abnormal   Collection Time: 09/08/17  4:49 AM  Result Value Ref Range   WBC 6.2 4.0 - 10.5 K/uL   RBC 3.71 (L) 3.87 - 5.11 MIL/uL   Hemoglobin 9.4 (L) 12.0 - 15.0 g/dL   HCT 29.4 (L) 36.0 - 46.0 %   MCV 79.2 78.0 - 100.0 fL   MCH 25.3 (L) 26.0 - 34.0 pg   MCHC 32.0 30.0 - 36.0 g/dL   RDW 14.2 11.5 - 15.5 %   Platelets 237 150 - 400 K/uL    Comment: Performed at [facility], [address] [place], [place] [address]  Comprehensive metabolic panel     Status: Abnormal    Collection Time: 09/08/17  4:49 AM  Result Value Ref Range   Sodium 135 135 - 145 mmol/L   Potassium 3.8 3.5 - 5.1 mmol/L   Chloride 106 98 - 111 mmol/L   CO2 21 (L) 22 - 32 mmol/L   Glucose, Bld 100 (H) 70 - 99 mg/dL   BUN 8 6 - 20 mg/dL   Creatinine, Ser 0.53 0.44 - 1.00 mg/dL   Calcium 8.5 (L) 8.9 - 10.3 mg/dL   Total Protein 6.1 (L) 6.5 - 8.1 g/dL   Albumin 2.4 (L) 3.5 - 5.0 g/dL   AST 22 15 - 41 U/L   ALT 43 0 - 44 U/L   Alkaline Phosphatase 65 38 - 126 U/L   Total Bilirubin 0.6 0.3 - 1.2 mg/dL   GFR calc non Af Amer >60 >60 mL/min   GFR calc Af Amer >60 >60 mL/min    Comment: (NOTE) The eGFR has been calculated using the CKD EPI equation. This calculation has not been validated in all clinical situations. eGFR's persistently <60 mL/min signify possible Chronic Kidney Disease.    Anion gap 8 5 - 15    Comment: Performed at [facility], [address] [place], [place] [address]   Review of Systems  Constitutional: Negative for fever.  Gastrointestinal: Positive for abdominal pain and nausea. Negative for vomiting.  Genitourinary: Negative for dysuria, vaginal bleeding and vaginal discharge.   Physical Exam   Blood pressure 140/83, pulse 90, temperature 98.2 F (36.8 C), resp. rate 18, height '5\' 2"'  (1.575 m), weight (!) 179.2 kg, last menstrual period 12/30/2016.   Patient Vitals for the past 24 hrs:  BP Temp Pulse Resp Height Weight  09/08/17 0750 (!) 111/50 - 83 18 - -  09/08/17 0644 112/65 - 89 - - -  09/08/17 0516 123/73 - 78 - - -  09/08/17 0501 140/72 - 80 - - -  09/08/17 0446 (!) 152/69 - 82 - - -  09/08/17 0423 140/83 - 90 - - -  09/08/17 0344 136/76 98.2 F (36.8 C) 82 18 '5\' 2"'  (1.575 m) (!) 179.2  kg   Physical Exam  Constitutional: She is oriented to person, place, and time. She appears well-developed and well-nourished. No distress.  HENT:  Head: Normocephalic.  Eyes: Pupils are equal, round, and reactive to light.  Respiratory: Effort normal.   Musculoskeletal: Normal range of motion.  Neurological: She is alert and oriented to person, place, and time. She has normal reflexes. She displays normal reflexes.  Negative clonus   Skin: Skin is warm. She is not diaphoretic.  Psychiatric: Her behavior is normal.   Fetal Tracing: Baseline: 150 bpm Variability: Moderate  Accelerations: 15x15 Decelerations: None Toco: UI  MAU Course  Procedures  None  MDM  Tylenol 1 gram PO & Phenergan 12.5 mg PO No relief of HA Oral hydration encouraged  Flexeril 10 mg PO ordered, pain down to 0/10. Labs WNL BP WNL   Assessment and Plan   A:  1. Gestational hypertension w/o significant proteinuria in 3rd trimester   2. Nausea   3. Headache behind the eyes     P:  Discharge home in stable condition  Return to MAU if symptoms worsen Preeclampsia precautions reviewed in detail F/U tomorrow in the office for NST and BP check Rx: Flexeril Increase oral fluid intake.   Lezlie Lye, NP 09/08/2017 11:46 AM

## 2017-09-08 NOTE — Progress Notes (Signed)
J Rasch NP notified pt feels much better after Flexeril. EFM strip reviewed by provider and pt stable for d/c home.

## 2017-09-08 NOTE — MAU Note (Signed)
Headache for 3-4hrs. Nauseated. Has not taken any meds. Denies visual changes. Had "cramp" in RUQ of abd for 30 mins earlier but that has gone away.

## 2017-09-09 ENCOUNTER — Ambulatory Visit (INDEPENDENT_AMBULATORY_CARE_PROVIDER_SITE_OTHER): Payer: Federal, State, Local not specified - PPO | Admitting: Obstetrics & Gynecology

## 2017-09-09 ENCOUNTER — Encounter: Payer: Self-pay | Admitting: Obstetrics & Gynecology

## 2017-09-09 VITALS — BP 120/67 | HR 90 | Wt 393.6 lb

## 2017-09-09 DIAGNOSIS — O0993 Supervision of high risk pregnancy, unspecified, third trimester: Secondary | ICD-10-CM

## 2017-09-09 DIAGNOSIS — Z3A34 34 weeks gestation of pregnancy: Secondary | ICD-10-CM

## 2017-09-09 DIAGNOSIS — O133 Gestational [pregnancy-induced] hypertension without significant proteinuria, third trimester: Secondary | ICD-10-CM

## 2017-09-09 DIAGNOSIS — Z1389 Encounter for screening for other disorder: Secondary | ICD-10-CM

## 2017-09-09 DIAGNOSIS — Z331 Pregnant state, incidental: Secondary | ICD-10-CM

## 2017-09-09 LAB — POCT URINALYSIS DIPSTICK OB
GLUCOSE, UA: NEGATIVE — AB
Ketones, UA: NEGATIVE
Nitrite, UA: NEGATIVE
RBC UA: NEGATIVE

## 2017-09-09 LAB — CULTURE, OB URINE: Special Requests: NORMAL

## 2017-09-09 NOTE — Progress Notes (Signed)
   HIGH-RISK PREGNANCY VISIT Patient name: Anna [name] MRN [id number]  Date of birth: 09-01-1995 Chief Complaint:   high risk ob (NST)  History of Present Illness:   Anna [name] is a [age] y.o. G1P0 female at 416w4d with an Estimated Date of Delivery: 10/17/17 being seen today for ongoing management of a high-risk pregnancy complicated by gestational HTN.  Today she reports both hands hurting especially at night. Contractions: Irregular.  .  Movement: Present. denies leaking of fluid.  Review of Systems:   Pertinent items are noted in HPI Denies abnormal vaginal discharge w/ itching/odor/irritation, headaches, visual changes, shortness of breath, chest pain, abdominal pain, severe nausea/vomiting, or problems with urination or bowel movements unless otherwise stated above. Pertinent History Reviewed:  Reviewed past medical,surgical, social, obstetrical and family history.  Reviewed problem list, medications and allergies. Physical Assessment:   Vitals:   09/09/17 1454  BP: 120/67  Pulse: 90  Weight: (!) 393 lb 9.6 oz (178.5 kg)  Body mass index is 71.99 kg/m.           Physical Examination:   General appearance: alert, well appearing, and in no distress  Mental status: alert, oriented to person, place, and time  Skin: warm & dry   Extremities: Edema: Trace    Cardiovascular: normal heart rate noted  Respiratory: normal respiratory effort, no distress  Abdomen: gravid, soft, non-tender  Pelvic: Cervical exam deferred         Fetal Status: Fetal Heart Rate (bpm): 145 Fundal Height: 36 cm Movement: Present    Fetal Surveillance Testing today: Reactive NST   Results for orders placed or performed in visit on 09/09/17 (from the past 24 hour(s))  POC Urinalysis Dipstick OB   Collection Time: 09/09/17  3:04 PM  Result Value Ref Range   Color, UA     Clarity, UA     Glucose, UA Negative (A) (none)   Bilirubin, UA     Ketones, UA neg    Spec Grav, UA     Blood, UA neg      pH, UA     POC Protein UA Trace Negative, Trace   Urobilinogen, UA     Nitrite, UA neg    Leukocytes, UA Moderate (2+) (A) Negative   Appearance     Odor      Assessment & Plan:  1) High-risk pregnancy G1P0 at 776w4d with an Estimated Date of Delivery: 10/17/17   2) Gestational HTN, stable, currently normal BP, 24 hour urine 354 mg, borderline so will continue twice weekly NST    Meds: No orders of the defined types were placed in this encounter.   Labs/procedures today:   Treatment Plan:  BP has normalized   Reviewed: Preterm labor symptoms and general obstetric precautions including but not limited to vaginal bleeding, contractions, leaking of fluid and fetal movement were reviewed in detail with the patient.  All questions were answered.  Follow-up: Return in about 3 days (around 09/12/2017) for NST, HROB.  Orders Placed This Encounter  Procedures  . POC Urinalysis Dipstick OB   Amaryllis DykeLuther H Eban Weick   09/09/2017 4:02 PM

## 2017-09-12 ENCOUNTER — Ambulatory Visit (INDEPENDENT_AMBULATORY_CARE_PROVIDER_SITE_OTHER): Payer: Federal, State, Local not specified - PPO | Admitting: Obstetrics and Gynecology

## 2017-09-12 VITALS — BP 125/72 | HR 83 | Wt 393.8 lb

## 2017-09-12 DIAGNOSIS — O133 Gestational [pregnancy-induced] hypertension without significant proteinuria, third trimester: Secondary | ICD-10-CM

## 2017-09-12 DIAGNOSIS — O0993 Supervision of high risk pregnancy, unspecified, third trimester: Secondary | ICD-10-CM

## 2017-09-12 DIAGNOSIS — Z1389 Encounter for screening for other disorder: Secondary | ICD-10-CM

## 2017-09-12 DIAGNOSIS — Z331 Pregnant state, incidental: Secondary | ICD-10-CM

## 2017-09-12 DIAGNOSIS — Z3A35 35 weeks gestation of pregnancy: Secondary | ICD-10-CM

## 2017-09-12 LAB — POCT URINALYSIS DIPSTICK OB
Blood, UA: NEGATIVE
Glucose, UA: NEGATIVE — AB
NITRITE UA: NEGATIVE
PROTEIN: NEGATIVE

## 2017-09-12 NOTE — Progress Notes (Signed)
Patient ID: Anna [name], female   DOB: [birth year], 22 y.o.   MRN: [id number]    [facility] Patient name: Anna [name] MRN [id number]  Date of birth: [birth year] Chief Complaint:   Routine Prenatal Visit (nst)  History of Present Illness:   Anna [name] is a 22 y.o. G1P0 female at 6273w0d with an Estimated Date of Delivery: 10/17/17 being seen today for ongoing management of a high-risk pregnancy complicated by gestational HTN.  Today she reports no complaints. The baby is moving a lot.   Contractions: Not present. Vag. Bleeding: None.  Movement: Present. denies leaking of fluid.  Review of Systems:   Pertinent items are noted in HPI Denies abnormal vaginal discharge w/ itching/odor/irritation, headaches, visual changes, shortness of breath, chest pain, abdominal pain, severe nausea/vomiting, or problems with urination or bowel movements unless otherwise stated above. Pertinent History Reviewed:  Reviewed past medical,surgical, social, obstetrical and family history.  Reviewed problem list, medications and allergies. Physical Assessment:   Vitals:   09/12/17 1612  BP: 125/72  Pulse: 83  Weight: (!) 393 lb 12.8 oz (178.6 kg)  Body mass index is 72.03 kg/m.           Physical Examination:   General appearance: alert, well appearing, and in no distress and oriented to person, place, and time  Mental status: alert, oriented to person, place, and time, normal mood, behavior, speech, dress, motor activity, and thought processes, affect appropriate to mood  Skin: warm & dry   Extremities: Edema: Trace    Cardiovascular: normal heart rate noted  Respiratory: normal respiratory effort, no distress  Abdomen: gravid, soft, non-tender  Pelvic: Cervical exam deferred         Fetal Status:     Movement: Present   FHR: 155 w/ accels to 170  Fetal Surveillance Testing today: NST  Results for orders placed or performed in visit on 09/12/17 (from the past 24  hour(s))  POC Urinalysis Dipstick OB   Collection Time: 09/12/17  4:15 PM  Result Value Ref Range   Color, UA     Clarity, UA     Glucose, UA Negative (A) (none)   Bilirubin, UA     Ketones, UA trace    Spec Grav, UA     Blood, UA neg    pH, UA     POC Protein UA Negative Negative, Trace   Urobilinogen, UA     Nitrite, UA neg    Leukocytes, UA Large (3+) (A) Negative   Appearance     Odor      Assessment & Plan:  1) High-risk pregnancy G1P0 at 4773w0d with an Estimated Date of Delivery: 10/17/17   2) GHTN, stable  3) Morbid Obesity Body mass index is 72.03 kg/m.  Meds: No orders of the defined types were placed in this encounter.  Labs/procedures today: none  Treatment Plan:  NST 2x/wk  Follow-up: Return in 4 days (on 09/16/2017) for NST, HROB.  Orders Placed This Encounter  Procedures  . POC Urinalysis Dipstick OB   By signing my name below, I, Pietro CassisEmily Tufford, attest that this documentation has been prepared under the direction and in the presence of Tilda BurrowFerguson, Marlynn Hinckley V, MD. Electronically Signed: Pietro CassisEmily Tufford, Medical Scribe. 09/12/17. 4:16 PM.  I personally performed the services described in this documentation, which was SCRIBED in my presence. The recorded information has been reviewed and considered accurate. It has been edited as necessary during review. Tilda BurrowJohn V Kashden Deboy, MD

## 2017-09-16 ENCOUNTER — Ambulatory Visit (INDEPENDENT_AMBULATORY_CARE_PROVIDER_SITE_OTHER): Payer: Federal, State, Local not specified - PPO | Admitting: Obstetrics & Gynecology

## 2017-09-16 ENCOUNTER — Encounter: Payer: Self-pay | Admitting: Obstetrics & Gynecology

## 2017-09-16 ENCOUNTER — Other Ambulatory Visit: Payer: Self-pay

## 2017-09-16 VITALS — BP 123/62 | HR 92 | Wt 393.0 lb

## 2017-09-16 DIAGNOSIS — Z331 Pregnant state, incidental: Secondary | ICD-10-CM

## 2017-09-16 DIAGNOSIS — Z3A35 35 weeks gestation of pregnancy: Secondary | ICD-10-CM

## 2017-09-16 DIAGNOSIS — Z1389 Encounter for screening for other disorder: Secondary | ICD-10-CM

## 2017-09-16 DIAGNOSIS — O0993 Supervision of high risk pregnancy, unspecified, third trimester: Secondary | ICD-10-CM

## 2017-09-16 DIAGNOSIS — O133 Gestational [pregnancy-induced] hypertension without significant proteinuria, third trimester: Secondary | ICD-10-CM

## 2017-09-16 LAB — POCT URINALYSIS DIPSTICK OB
Blood, UA: NEGATIVE
Glucose, UA: NEGATIVE — AB
Ketones, UA: NEGATIVE
Nitrite, UA: NEGATIVE

## 2017-09-16 NOTE — Progress Notes (Signed)
done

## 2017-09-16 NOTE — Progress Notes (Signed)
Patient ID: Anna [name] [place], female   DOB: Jan 28, 1996, 22 y.o.   MRN: [id number]   [facility] Patient name: Anna [name] Soffer MRN [id number]  Date of birth: Jan 28, 1996 Chief Complaint:   High Risk Gestation (NST)  History of Present Illness:   Anna [name] Ruffini is a 22 y.o. G1P0 female at 4294w4d with an Estimated Date of Delivery: 10/17/17 being seen today for ongoing management of a high-risk pregnancy complicated by gestational HTN.  Today she reports no complaints. Contractions: Not present. Vag. Bleeding: None.  Movement: Present. denies leaking of fluid.  Review of Systems:   Pertinent items are noted in HPI Denies abnormal vaginal discharge w/ itching/odor/irritation, headaches, visual changes, shortness of breath, chest pain, abdominal pain, severe nausea/vomiting, or problems with urination or bowel movements unless otherwise stated above. Pertinent History Reviewed:  Reviewed past medical,surgical, social, obstetrical and family history.  Reviewed problem list, medications and allergies. Physical Assessment:   Vitals:   09/16/17 1548 09/16/17 1627  BP: (!) 158/87 123/62  Pulse: (!) 101 92  Weight: (!) 393 lb (178.3 kg)   Body mass index is 71.88 kg/m.           Physical Examination:   General appearance: alert, well appearing, and in no distress  Mental status: alert, oriented to person, place, and time  Skin: warm & dry   Extremities: Edema: Trace    Cardiovascular: normal heart rate noted  Respiratory: normal respiratory effort, no distress  Abdomen: gravid, soft, non-tender  Pelvic: Cervical exam deferred         Fetal Status:     Movement: Present    Fetal Surveillance Testing today: reactive NST   Results for orders placed or performed in visit on 09/16/17 (from the past 24 hour([name]))  POC Urinalysis Dipstick OB   Collection Time: 09/16/17  3:54 PM  Result Value Ref Range   Color, UA     Clarity, UA     Glucose, UA Negative (A) (none)   Bilirubin, UA     Ketones, UA neg    Spec Grav, UA     Blood, UA neg    pH, UA     POC Protein UA Trace Negative, Trace   Urobilinogen, UA     Nitrite, UA neg    Leukocytes, UA Moderate (2+) (A) Negative   Appearance     Odor      Assessment & Plan:  1) High-risk pregnancy G1P0 at 6694w4d with an Estimated Date of Delivery: 10/17/17   2) Gestational Hypertension, stable, BP normalized    Meds: No orders of the defined types were placed in this encounter.   Labs/procedures today: reactive NST  Treatment Plan:  Twice weekly NST with normalized BP  Reviewed: Preterm labor symptoms and general obstetric precautions including but not limited to vaginal bleeding, contractions, leaking of fluid and fetal movement were reviewed in detail with the patient.  All questions were answered.  Follow-up: Return in about 3 days (around 09/19/2017) for NST, HROB.  Orders Placed This Encounter  Procedures  . POC Urinalysis Dipstick OB   Amaryllis DykeLuther H Eure  09/16/2017 4:30 PM

## 2017-09-18 ENCOUNTER — Encounter: Payer: Self-pay | Admitting: Advanced Practice Midwife

## 2017-09-18 ENCOUNTER — Telehealth: Payer: Self-pay | Admitting: Obstetrics & Gynecology

## 2017-09-18 ENCOUNTER — Ambulatory Visit (INDEPENDENT_AMBULATORY_CARE_PROVIDER_SITE_OTHER): Payer: Federal, State, Local not specified - PPO | Admitting: Advanced Practice Midwife

## 2017-09-18 VITALS — BP 138/92 | HR 89 | Wt 396.2 lb

## 2017-09-18 DIAGNOSIS — O0993 Supervision of high risk pregnancy, unspecified, third trimester: Secondary | ICD-10-CM

## 2017-09-18 DIAGNOSIS — Z3403 Encounter for supervision of normal first pregnancy, third trimester: Secondary | ICD-10-CM

## 2017-09-18 DIAGNOSIS — Z1389 Encounter for screening for other disorder: Secondary | ICD-10-CM

## 2017-09-18 DIAGNOSIS — O133 Gestational [pregnancy-induced] hypertension without significant proteinuria, third trimester: Secondary | ICD-10-CM

## 2017-09-18 DIAGNOSIS — Z3A35 35 weeks gestation of pregnancy: Secondary | ICD-10-CM

## 2017-09-18 DIAGNOSIS — Z331 Pregnant state, incidental: Secondary | ICD-10-CM

## 2017-09-18 LAB — POCT URINALYSIS DIPSTICK OB
Glucose, UA: NEGATIVE — AB
KETONES UA: NEGATIVE
NITRITE UA: NEGATIVE
PROTEIN: NEGATIVE

## 2017-09-18 MED ORDER — PNV PRENATAL PLUS MULTIVITAMIN 27-1 MG PO TABS: 1.0000 | ORAL_TABLET | Freq: Every day | ORAL | 11 refills | Status: DC

## 2017-09-18 MED ORDER — BUTALBITAL-APAP-CAFFEINE 50-325-40 MG PO TABS: 1.0000 | ORAL_TABLET | Freq: Four times a day (QID) | ORAL | 0 refills | Status: DC | PRN

## 2017-09-18 NOTE — Progress Notes (Signed)
WORK IN for nausea, HA, + contractions. Patient reports good fetal movement, denies any bleeding and no rupture of membranes symptoms. Hips hurt. Has no real idea of how often ctx occur; they are infrequent, but "hurt a lot" when they happen.     Took tylenol once yesterday and once today, didn't help much.  HA is moderate, intermittent since yesterday, frontal, bilateral, no vision changes.    No vomiting, nausea comes and goes.  .    G1P0 2966w6d Estimated Date of Delivery: 10/17/17  Blood pressure (!) 138/92, pulse 89, weight (!) 396 lb 3.2 oz (179.7 kg), last menstrual period 12/30/2016.     Physical Assessment:   Vitals:   09/18/17 1619 09/18/17 1623  BP: (!) 153/84 (!) 138/92  Pulse: 87 89  Weight: (!) 396 lb 3.2 oz (179.7 kg)   Body mass index is 72.47 kg/m.        Physical Examination:   General appearance: Well appearing, and in no distress  Mental status: Alert, oriented to person, place, and time  Skin: Warm & dry  Cardiovascular: Normal heart rate noted  Respiratory: Normal respiratory effort, no distress  Abdomen: Soft, gravid, nontender  Pelvic: Cervical exam performed         Extremities: Edema: Trace  Fetal Status:     Movement: Present    No results found for this or any previous visit (from the past 24 hour(s)).   Orders Placed This Encounter  Procedures  . POC Urinalysis Dipstick OB    Plan:  Rx fioricet. PreE precautions discussed. Declined meds for nausea. Labor precautions discussed.   F/U tomorrow for NST/HROB appt as scheduled. [facility] can [facility]  Return for As scheduled.

## 2017-09-18 NOTE — Telephone Encounter (Signed)
Discussed patient with Selena BattenKim. She recommends that she is seen today if we have opening, if not she needs to go to [facility]U for evaluation. Elana AlmJennifer Manley to call patient to arrange follow up.

## 2017-09-18 NOTE — Patient Instructions (Addendum)
AM I IN LABOR? What is labor? Labor is the work that your body does to birth your baby. Your uterus (the womb) contracts. Your cervix (the mouth of the uterus) opens. You will push your baby out into the world.  What do contractions (labor pains) feel like? When they first start, contractions usually feel like cramps during your period. Sometimes you feel pain in your back. Most often, contractions feel like muscles pulling painfully in your lower belly. At first, the contractions will probably be 15 to 20 minutes apart. They will not feel too painful. As labor goes on, the contractions get stronger, closer together, and more painful.  How do I time the contractions? Time your contractions by counting the number of minutes from the start of one contraction to the start of the next contraction.  What should I do when the contractions start? If it is night and you can sleep, sleep. If it happens during the day, here are some things you can do to take care of yourself at home: ? Walk. If the pains you are having are real labor, walking will make the contractions come faster and harder. If the contractions are not going to continue and be real labor, walking will make the contractions slow down. ? Take a shower or bath. This will help you relax. ? Eat. Labor is a big event. It takes a lot of energy. ? Drink water. Not drinking enough water can cause false labor (contractions that hurt but do not open your cervix). If this is true labor, drinking water will help you have strength to get through your labor. ? Take a nap. Get all the rest you can. ? Get a massage. If your labor is in your back, a strong massage on your lower back may feel very good. Getting a foot massage is always good. ? Don't panic. You can do this. Your body was made for this. You are strong!  When should I go to the hospital or call my health care provider? ? Your contractions have been 5 minutes apart or less for at least 1  hour. ? If several contractions are so painful you cannot walk or talk during one. ? Your bag of waters breaks. (You may have a big gush of water or just water that runs down your legs when you walk.)  Are there other reasons to call my health care provider? Yes, you should call your health care provider or go to the hospital if you start to bleed like you are having a period-- blood that soaks your underwear or runs down your legs, if you have sudden severe pain, if your baby has not moved for several hours, or if you are leaking green fluid. The rule is as follows: If you are very concerned about something, call.  For Headaches:   Stay well hydrated, drink enough water so that your urine is clear, sometimes if you are dehydrated you can get headaches  Eat small frequent meals and snacks, sometimes if you are hungry you can get headaches  Sometimes you get headaches during pregnancy from the pregnancy hormones  You can try tylenol (1-2 regular strength 325mg or 1-2 extra strength 500mg) as directed on the box. The least amount of medication that works is best.   Cool compresses (cool wet washcloth or ice pack) to area of head that is hurting  You can also try drinking a caffeinated drink to see if this will help  If not helping, try   below:  For Prevention of Headaches/Migraines:  CoQ10 100mg three times daily  Vitamin B2 400mg daily  Magnesium Oxide 400-600mg daily  If You Get a Bad Headache/Migraine:  Benadryl 25mg   Magnesium Oxide  1 large Gatorade  2 extra strength Tylenol (1,000mg total)  1 cup coffee or Coke  If this doesn't help please call us @ [phone]   

## 2017-09-19 ENCOUNTER — Other Ambulatory Visit: Payer: Federal, State, Local not specified - PPO | Admitting: Advanced Practice Midwife

## 2017-09-20 ENCOUNTER — Ambulatory Visit (INDEPENDENT_AMBULATORY_CARE_PROVIDER_SITE_OTHER): Payer: Federal, State, Local not specified - PPO | Admitting: *Deleted

## 2017-09-20 DIAGNOSIS — O133 Gestational [pregnancy-induced] hypertension without significant proteinuria, third trimester: Secondary | ICD-10-CM

## 2017-09-20 DIAGNOSIS — O0993 Supervision of high risk pregnancy, unspecified, third trimester: Secondary | ICD-10-CM

## 2017-09-20 NOTE — Progress Notes (Signed)
Tracing reviewed by Dr Despina HiddenEure.  Fetal movement visualized and heard during monitoring.

## 2017-09-22 ENCOUNTER — Encounter (HOSPITAL_COMMUNITY): Payer: Self-pay | Admitting: Certified Registered Nurse Anesthetist

## 2017-09-22 ENCOUNTER — Encounter (HOSPITAL_COMMUNITY): Payer: Self-pay | Admitting: Emergency Medicine

## 2017-09-22 ENCOUNTER — Inpatient Hospital Stay (HOSPITAL_BASED_OUTPATIENT_CLINIC_OR_DEPARTMENT_OTHER): Payer: Federal, State, Local not specified - PPO

## 2017-09-22 ENCOUNTER — Inpatient Hospital Stay (HOSPITAL_COMMUNITY)
Admission: AD | Admit: 2017-09-22 | Discharge: 2017-09-22 | Disposition: A | Discharge status: Home / Self Care | Payer: Federal, State, Local not specified - PPO | Source: Ambulatory Visit | Attending: Obstetrics and Gynecology | Admitting: Obstetrics and Gynecology

## 2017-09-22 DIAGNOSIS — O368130 Decreased fetal movements, third trimester, not applicable or unspecified: Secondary | ICD-10-CM | POA: Insufficient documentation

## 2017-09-22 DIAGNOSIS — Z3A36 36 weeks gestation of pregnancy: Secondary | ICD-10-CM | POA: Diagnosis not present

## 2017-09-22 DIAGNOSIS — O99213 Obesity complicating pregnancy, third trimester: Secondary | ICD-10-CM | POA: Diagnosis not present

## 2017-09-22 DIAGNOSIS — O368390 Maternal care for abnormalities of the fetal heart rate or rhythm, unspecified trimester, not applicable or unspecified: Secondary | ICD-10-CM

## 2017-09-22 DIAGNOSIS — O4703 False labor before 37 completed weeks of gestation, third trimester: Secondary | ICD-10-CM | POA: Diagnosis not present

## 2017-09-22 DIAGNOSIS — Z3689 Encounter for other specified antenatal screening: Secondary | ICD-10-CM

## 2017-09-22 LAB — URINALYSIS, ROUTINE W REFLEX MICROSCOPIC
BILIRUBIN URINE: NEGATIVE
Glucose, UA: NEGATIVE mg/dL
HGB URINE DIPSTICK: NEGATIVE
Ketones, ur: NEGATIVE mg/dL
Nitrite: NEGATIVE
Protein, ur: 30 mg/dL — AB
SPECIFIC GRAVITY, URINE: 1.025 (ref 1.005–1.030)
pH: 6 (ref 5.0–8.0)

## 2017-09-22 MED ORDER — LACTATED RINGERS IV BOLUS: 1000.0000 mL | Freq: Once | INTRAVENOUS | Status: DC

## 2017-09-22 NOTE — MAU Provider Note (Addendum)
Chief Complaint:  Decreased Fetal Movement   None     HPI: Anna [name] is a 22 y.o. G1P0 at [redacted]w[redacted]d pt of [facility] with gestational HTN who presents to maternity admissions reporting no fetal movement x 24 hours with associated cramping abdominal pain. Her pain is intermittent pelvic pressure and back pain, worsening over time.  There are no other associated symptoms.  She has not tried any treatments.    HPI  Past Medical History: Past Medical History:  Diagnosis Date  . Allergy   . GERD (gastroesophageal reflux disease)   . Hypertension     Past obstetric history: OB History  Gravida Para Term Preterm AB Living  1            SAB TAB Ectopic Multiple Live Births               # Outcome Date GA Lbr Len/2nd Weight Sex Delivery Anes PTL Lv  1 Current             Past Surgical History: Past Surgical History:  Procedure Laterality Date  . MYRINGOTOMY    . TONSILLECTOMY AND ADENOIDECTOMY      Family History: Family History  Problem Relation Age of Onset  . Asthma Mother   . Anemia Mother   . Hypertension Father   . Cancer Maternal Grandfather   . Hypertension Paternal Grandmother   . Heart failure Paternal Grandmother     Social History: Social History   Tobacco Use  . Smoking status: Never Smoker  . Smokeless tobacco: Never Used  Substance Use Topics  . Alcohol use: No  . Drug use: No    Allergies:  Allergies  Allergen Reactions  . Diflucan [Fluconazole] Rash    Meds:  Medications Prior to Admission  Medication Sig Dispense Refill Last Dose  . butalbital-acetaminophen-caffeine (FIORICET, ESGIC) 50-325-40 MG tablet Take 1 tablet by mouth every 6 (six) hours as needed for headache. 20 tablet 0 Taking  . cetirizine (ZYRTEC) 5 MG tablet Take 1 tablet (5 mg total) by mouth daily. 30 tablet 3 Taking  . ferrous sulfate 325 (65 FE) MG tablet Take 325 mg by mouth 2 (two) times daily with a meal.   Taking  . pantoprazole (PROTONIX) 20 MG tablet Take 1  tablet (20 mg total) by mouth daily. 30 tablet 6 Taking  . Prenatal MV & Min w/FA-DHA (PRENATAL ADULT GUMMY/DHA/FA) 0.4-25 MG CHEW Chew 1 tablet by mouth daily. 30 tablet 11 Taking  . Prenatal Vit-Fe Fumarate-FA (PNV PRENATAL PLUS MULTIVITAMIN) 27-1 MG TABS Take 1 tablet by mouth daily. (Patient not taking: Reported on 09/20/2017) 30 tablet 11 Not Taking    ROS:  Review of Systems  Constitutional: Negative for chills, fatigue and fever.  Eyes: Negative for visual disturbance.  Respiratory: Negative for shortness of breath.   Cardiovascular: Negative for chest pain.  Gastrointestinal: Negative for abdominal pain, nausea and vomiting.  Genitourinary: Positive for pelvic pain. Negative for difficulty urinating, dysuria, flank pain, vaginal bleeding, vaginal discharge and vaginal pain.  Neurological: Negative for dizziness and headaches.  Psychiatric/Behavioral: Negative.      I have reviewed patient's Past Medical Hx, Surgical Hx, Family Hx, Social Hx, medications and allergies.   Physical Exam   Patient Vitals for the past 24 hrs:  BP Pulse  09/22/17 0219 129/83 89   Constitutional: Well-developed, well-nourished female in no acute distress.  Cardiovascular: normal rate Respiratory: normal effort GI: Abd soft, non-tender, gravid appropriate for gestational age.  MS: Extremities nontender, no edema, normal ROM Neurologic: Alert and oriented x 4.  GU: Neg CVAT.  Dilation: Closed Effacement (%): Thick Cervical Position: Posterior Exam by:: Misty StanleyLisa Leftwich-kerby, cnm  Dilation: Closed Effacement (%): Thick Cervical Position: Posterior Exam by:: Shirlyn GoltzLisa Leftwich-kerby, cnm  FHT:  Baseline 160 , moderate variability, accelerations present, isolated variable decels lasting 15 seconds Contractions: q 2-5 mins, mild to palpation   Labs: Results for orders placed or performed during the hospital encounter of 09/22/17 (from the past 24 hour(s))  Urinalysis, Routine w reflex microscopic      Status: Abnormal   Collection Time: 09/22/17  2:38 AM  Result Value Ref Range   Color, Urine YELLOW YELLOW   APPearance HAZY (A) CLEAR   Specific Gravity, Urine 1.025 1.005 - 1.030   pH 6.0 5.0 - 8.0   Glucose, UA NEGATIVE NEGATIVE mg/dL   Hgb urine dipstick NEGATIVE NEGATIVE   Bilirubin Urine NEGATIVE NEGATIVE   Ketones, ur NEGATIVE NEGATIVE mg/dL   Protein, ur 30 (A) NEGATIVE mg/dL   Nitrite NEGATIVE NEGATIVE   Leukocytes, UA MODERATE (A) NEGATIVE   RBC / HPF 0-5 0 - 5 RBC/hpf   WBC, UA 11-20 0 - 5 WBC/hpf   Bacteria, UA RARE (A) NONE SEEN   Squamous Epithelial / LPF 11-20 0 - 5   Mucus PRESENT    O/Positive/-- (02/21 1522)  Imaging:  MFM BPP, preliminary report 8/8  MAU Course/MDM: I have ordered labs and reviewed results.  NST reviewed, accels present but variables so nonreactive initially, then reactive with no additional decels x 2+ hours BPP 8/8, with reactive NST is now 10/10 Frequent contractions on toco but cervix closed/thick/high so no evidence of preterm labor.  [facility] Preterm labor precautions and fetal kick counts reviewed Pt discharge with strict return precautions.  Today's evaluation included a work-up for preterm labor which can be life-threatening for both mom and baby.  Assessment: 1. Decreased fetal movements in third trimester, single or unspecified fetus   2. Variable fetal heart rate decelerations, antepartum   3. NST (non-stress test) reactive   4. Preterm uterine contractions in third trimester, antepartum     Plan: Discharge home Labor precautions and fetal kick counts Follow-up Information    [facility] Follow up.   Why:  On Monday, 09/03/17, as scheduled. Return to MAU as needed for emergencies. Contact information: [address] [place] [id number] [phone]         Allergies as of 09/22/2017      Reactions   Diflucan [fluconazole] Rash       Medication List    TAKE these medications   butalbital-acetaminophen-caffeine 50-325-40 MG tablet Commonly known as:  FIORICET, ESGIC Take 1 tablet by mouth every 6 (six) hours as needed for headache.   cetirizine 5 MG tablet Commonly known as:  ZYRTEC Take 1 tablet (5 mg total) by mouth daily.   ferrous sulfate 325 (65 FE) MG tablet Take 325 mg by mouth 2 (two) times daily with a meal.   pantoprazole 20 MG tablet Commonly known as:  PROTONIX Take 1 tablet (20 mg total) by mouth daily.   PNV PRENATAL PLUS MULTIVITAMIN 27-1 MG Tabs Take 1 tablet by mouth daily.   Prenatal Adult Gummy/DHA/FA 0.4-25 MG Chew Chew 1 tablet by mouth daily.       Sharen CounterLisa Leftwich-Kirby Certified Nurse-Midwife 09/22/2017 7:01 AM

## 2017-09-22 NOTE — Progress Notes (Signed)
Baby active but pt does not feel FM 

## 2017-09-23 ENCOUNTER — Ambulatory Visit (INDEPENDENT_AMBULATORY_CARE_PROVIDER_SITE_OTHER): Payer: Federal, State, Local not specified - PPO | Admitting: Women's Health

## 2017-09-23 ENCOUNTER — Encounter: Payer: Self-pay | Admitting: Women's Health

## 2017-09-23 VITALS — BP 139/70 | HR 92 | Wt >= 6400 oz

## 2017-09-23 DIAGNOSIS — Z331 Pregnant state, incidental: Secondary | ICD-10-CM

## 2017-09-23 DIAGNOSIS — O0993 Supervision of high risk pregnancy, unspecified, third trimester: Secondary | ICD-10-CM

## 2017-09-23 DIAGNOSIS — O133 Gestational [pregnancy-induced] hypertension without significant proteinuria, third trimester: Secondary | ICD-10-CM

## 2017-09-23 DIAGNOSIS — Z3A36 36 weeks gestation of pregnancy: Secondary | ICD-10-CM

## 2017-09-23 DIAGNOSIS — Z1389 Encounter for screening for other disorder: Secondary | ICD-10-CM

## 2017-09-23 LAB — POCT URINALYSIS DIPSTICK OB
Blood, UA: NEGATIVE
Glucose, UA: NEGATIVE — AB
KETONES UA: NEGATIVE
LEUKOCYTES UA: NEGATIVE
NITRITE UA: NEGATIVE
POC,PROTEIN,UA: NEGATIVE

## 2017-09-23 NOTE — Progress Notes (Signed)
HIGH-RISK PREGNANCY VISIT Patient name: Anna [name] MRN [id number]  Date of birth: Jul 30, 1995 Chief Complaint:   Routine Prenatal Visit  History of Present Illness:   Anna [name] is a 22 y.o. G1P0 female at [redacted]w[redacted]d with an Estimated Date of Delivery: 10/17/17 being seen today for ongoing management of a high-risk pregnancy complicated by [facility].  Today she reports no complaints. Went to [facility] yesterday, had preterm uc's, cx closed, decreased fm>BPP 8/8. Normal fm today, uc's are irregular, nothing like they were yesterday. Denies ha, visual changes, ruq/epigastric pain, n/v.   Contractions: Irregular. Vag. Bleeding: None.  Movement: Present. denies leaking of fluid.  Review of Systems:   Pertinent items are noted in HPI Denies abnormal vaginal discharge w/ itching/odor/irritation, headaches, visual changes, shortness of breath, chest pain, abdominal pain, severe nausea/vomiting, or problems with urination or bowel movements unless otherwise stated above. Pertinent History Reviewed:  Reviewed past medical,surgical, social, obstetrical and family history.  Reviewed problem list, medications and allergies. Physical Assessment:   Vitals:   09/23/17 1553  BP: 139/70  Pulse: 92  Weight: (!) 404 lb (183.3 kg)  Body mass index is 73.89 kg/m.           Physical Examination:   General appearance: alert, well appearing, and in no distress  Mental status: alert, oriented to person, place, and time  Skin: warm & dry   Extremities: Edema: Moderate pitting, indentation subsides rapidly    Cardiovascular: normal heart rate noted  Respiratory: normal respiratory effort, no distress  Abdomen: gravid, soft, non-tender  Pelvic: Cervical exam deferred , GBS, gc/ct collected, pt declined SVE        Fetal Status: Fetal Heart Rate (bpm): 150 Fundal Height: 28 cm from U Movement: Present    Fetal Surveillance Testing today: doppler, had bpp 8/8 yesterday   Results for orders placed or performed  in visit on 09/23/17 (from the past 24 hour(s))  POC Urinalysis Dipstick OB   Collection Time: 09/23/17  3:52 PM  Result Value Ref Range   Color, UA     Clarity, UA     Glucose, UA Negative (A) (none)   Bilirubin, UA     Ketones, UA neg    Spec Grav, UA     Blood, UA neg    pH, UA     POC Protein UA Negative Negative, Trace   Urobilinogen, UA     Nitrite, UA neg    Leukocytes, UA Negative Negative   Appearance     Odor      Assessment & Plan:  1) High-risk pregnancy G1P0 at [redacted]w[redacted]d with an Estimated Date of Delivery: 10/17/17   2) GHTN, stable, bp normal today, last visit, and at [facility] yesterday. Asymptomatic. Had a 24hr urine of 354 on 7/31, repeat P:C on 8/11 was normal at 0.14, no proteinuria today. Will check pre-e labs today, plan IOL @ 39wks unless bp's become elevated again. Discussed pre-e s/s, reasons to seek care  3) BMI 73  Meds: No orders of the defined types were placed in this encounter.   Labs/procedures today: gbs, gc/ct  Treatment Plan:  2x/wk testing nst alt w/ bpp/dopp, EFW on Thurs, IOL @ 39wks if bp's remain normal- discussed w/ JVF  Reviewed: Preterm labor symptoms and general obstetric precautions including but not limited to vaginal bleeding, contractions, leaking of fluid and fetal movement were reviewed in detail with the patient.  All questions were answered.  Follow-up: Return for As scheduled Thurs for bpp/dopp/efw  u/s and HROB.  Orders Placed This Encounter  Procedures  . GC/Chlamydia Probe Amp([facility])  . Culture, beta strep (group b only)  . US OB Follow Up  . US FETAL BPP WO NON STRESS  . US UA Cord Doppler  . CBC  . Comprehensive metabolic panel  . Protein / creatinine ratio, urine  . POC Urinalysis Dipstick OB   Cheral MarkerKimberly R Kirrah Mustin CNM, [facility] [id number] 09/23/2017 4:50 PM

## 2017-09-23 NOTE — Patient Instructions (Addendum)
Elwin Sleightanautica S Stay, I greatly value your feedback.  If you receive a survey following your visit with us today, we appreciate you taking the time to fill it out.  Thanks, Joellyn HaffKim Marajade Lei, CNM, WHNP-BC   Call the office [phone]) or go to [facility] if:  You begin to have strong, frequent contractions  Your water breaks.  Sometimes it is a big gush of fluid, sometimes it is just a trickle that keeps getting your panties wet or running down your legs  You have vaginal bleeding.  It is normal to have a small amount of spotting if your cervix was checked.   You don't feel your baby moving like normal.  If you don't, get you something to eat and drink and lay down and focus on feeling your baby move.  You should feel at least 10 movements in 2 hours.  If you don't, you should call the office or go to [facility].    Call the office [phone]) or go to [facility] for these signs of pre-eclampsia:  Severe headache that does not go away with Tylenol  Visual changes- seeing spots, double, blurred vision  Pain under your right breast or upper abdomen that does not go away with Tums or heartburn medicine  Nausea and/or vomiting  Severe swelling in your hands, feet, and face     Braxton Hicks Contractions Contractions of the uterus can occur throughout pregnancy, but they are not always a sign that you are in labor. You may have practice contractions called Braxton Hicks contractions. These false labor contractions are sometimes confused with true labor. What are Deberah PeltonBraxton Hicks contractions? Braxton Hicks contractions are tightening movements that occur in the muscles of the uterus before labor. Unlike true labor contractions, these contractions do not result in opening (dilation) and thinning of the cervix. Toward the end of pregnancy (32-34 weeks), Braxton Hicks contractions can happen more often and may become stronger. These contractions are sometimes difficult to tell apart from  true labor because they can be very uncomfortable. You should not feel embarrassed if you go to the hospital with false labor. Sometimes, the only way to tell if you are in true labor is for your health care provider to look for changes in the cervix. The health care provider will do a physical exam and may monitor your contractions. If you are not in true labor, the exam should show that your cervix is not dilating and your water has not broken. If there are other health problems associated with your pregnancy, it is completely safe for you to be sent home with false labor. You may continue to have Braxton Hicks contractions until you go into true labor. How to tell the difference between true labor and false labor True labor  Contractions last 30-70 seconds.  Contractions become very regular.  Discomfort is usually felt in the top of the uterus, and it spreads to the lower abdomen and low back.  Contractions do not go away with walking.  Contractions usually become more intense and increase in frequency.  The cervix dilates and gets thinner. False labor  Contractions are usually shorter and not as strong as true labor contractions.  Contractions are usually irregular.  Contractions are often felt in the front of the lower abdomen and in the groin.  Contractions may go away when you walk around or change positions while lying down.  Contractions get weaker and are shorter-lasting as time goes on.  The cervix usually does not  dilate or become thin. Follow these instructions at home:  Take over-the-counter and prescription medicines only as told by your health care provider.  Keep up with your usual exercises and follow other instructions from your health care provider.  Eat and drink lightly if you think you are going into labor.  If Braxton Hicks contractions are making you uncomfortable: ? Change your position from lying down or resting to walking, or change from walking to  resting. ? Sit and rest in a tub of warm water. ? Drink enough fluid to keep your urine pale yellow. Dehydration may cause these contractions. ? Do slow and deep breathing several times an hour.  Keep all follow-up prenatal visits as told by your health care provider. This is important. Contact a health care provider if:  You have a fever.  You have continuous pain in your abdomen. Get help right away if:  Your contractions become stronger, more regular, and closer together.  You have fluid leaking or gushing from your vagina.  You pass blood-tinged mucus (bloody show).  You have bleeding from your vagina.  You have low back pain that you never had before.  You feel your baby's head pushing down and causing pelvic pressure.  Your baby is not moving inside you as much as it used to. Summary  Contractions that occur before labor are called Braxton Hicks contractions, false labor, or practice contractions.  Braxton Hicks contractions are usually shorter, weaker, farther apart, and less regular than true labor contractions. True labor contractions usually become progressively stronger and regular and they become more frequent.  Manage discomfort from [facility] contractions by changing position, resting in a warm bath, drinking plenty of water, or practicing deep breathing. This information is not intended to replace advice given to you by your health care provider. Make sure you discuss any questions you have with your health care provider. Document Released: 05/31/2016 Document Revised: 05/31/2016 Document Reviewed: 05/31/2016 Elsevier Interactive Patient Education  2018 [employer].

## 2017-09-24 LAB — CBC
HEMATOCRIT: 31.9 % — AB (ref 34.0–46.6)
Hemoglobin: 9.9 g/dL — ABNORMAL LOW (ref 11.1–15.9)
MCH: 24 pg — AB (ref 26.6–33.0)
MCHC: 31 g/dL — ABNORMAL LOW (ref 31.5–35.7)
MCV: 77 fL — AB (ref 79–97)
PLATELETS: 299 10*3/uL (ref 150–450)
RBC: 4.12 x10E6/uL (ref 3.77–5.28)
RDW: 14.7 % (ref 12.3–15.4)
WBC: 7 10*3/uL (ref 3.4–10.8)

## 2017-09-24 LAB — COMPREHENSIVE METABOLIC PANEL
A/G RATIO: 1.1 — AB (ref 1.2–2.2)
ALT: 21 IU/L (ref 0–32)
AST: 15 IU/L (ref 0–40)
Albumin: 3.2 g/dL — ABNORMAL LOW (ref 3.5–5.5)
Alkaline Phosphatase: 74 IU/L (ref 39–117)
BILIRUBIN TOTAL: 0.3 mg/dL (ref 0.0–1.2)
BUN/Creatinine Ratio: 17 (ref 9–23)
BUN: 8 mg/dL (ref 6–20)
CALCIUM: 8.9 mg/dL (ref 8.7–10.2)
CHLORIDE: 105 mmol/L (ref 96–106)
CO2: 18 mmol/L — ABNORMAL LOW (ref 20–29)
Creatinine, Ser: 0.46 mg/dL — ABNORMAL LOW (ref 0.57–1.00)
GFR, EST AFRICAN AMERICAN: 163 mL/min/{1.73_m2} (ref >59)
GFR, EST NON AFRICAN AMERICAN: 142 mL/min/{1.73_m2} (ref >59)
GLOBULIN, TOTAL: 2.9 g/dL (ref 1.5–4.5)
Glucose: 104 mg/dL — ABNORMAL HIGH (ref 65–99)
POTASSIUM: 4.3 mmol/L (ref 3.5–5.2)
SODIUM: 139 mmol/L (ref 134–144)
Total Protein: 6.1 g/dL (ref 6.0–8.5)

## 2017-09-24 LAB — PROTEIN / CREATININE RATIO, URINE
Creatinine, Urine: 129.5 mg/dL
PROTEIN UR: 27.2 mg/dL
Protein/Creat Ratio: 210 mg/g creat — ABNORMAL HIGH (ref 0–200)

## 2017-09-25 LAB — GC/CHLAMYDIA PROBE AMP
Chlamydia trachomatis, NAA: NEGATIVE
Neisseria gonorrhoeae by PCR: NEGATIVE

## 2017-09-26 ENCOUNTER — Encounter (HOSPITAL_COMMUNITY): Payer: Self-pay | Admitting: Anesthesiology

## 2017-09-26 ENCOUNTER — Encounter (HOSPITAL_COMMUNITY): Payer: Self-pay | Admitting: Obstetrics and Gynecology

## 2017-09-26 ENCOUNTER — Encounter: Payer: Self-pay | Admitting: Advanced Practice Midwife

## 2017-09-26 ENCOUNTER — Ambulatory Visit (INDEPENDENT_AMBULATORY_CARE_PROVIDER_SITE_OTHER): Payer: Federal, State, Local not specified - PPO

## 2017-09-26 ENCOUNTER — Ambulatory Visit (INDEPENDENT_AMBULATORY_CARE_PROVIDER_SITE_OTHER): Payer: Federal, State, Local not specified - PPO | Admitting: Advanced Practice Midwife

## 2017-09-26 ENCOUNTER — Inpatient Hospital Stay (HOSPITAL_COMMUNITY)
Admission: AD | Admit: 2017-09-26 | Discharge: 2017-10-01 | DRG: 788 | Disposition: A | Discharge status: Home / Self Care | Payer: Federal, State, Local not specified - PPO | Attending: Obstetrics and Gynecology | Admitting: Obstetrics and Gynecology

## 2017-09-26 VITALS — BP 148/93 | Wt >= 6400 oz

## 2017-09-26 DIAGNOSIS — O0993 Supervision of high risk pregnancy, unspecified, third trimester: Secondary | ICD-10-CM | POA: Diagnosis not present

## 2017-09-26 DIAGNOSIS — I10 Essential (primary) hypertension: Secondary | ICD-10-CM | POA: Diagnosis not present

## 2017-09-26 DIAGNOSIS — Z8679 Personal history of other diseases of the circulatory system: Secondary | ICD-10-CM | POA: Diagnosis present

## 2017-09-26 DIAGNOSIS — O133 Gestational [pregnancy-induced] hypertension without significant proteinuria, third trimester: Secondary | ICD-10-CM | POA: Diagnosis not present

## 2017-09-26 DIAGNOSIS — O139 Gestational [pregnancy-induced] hypertension without significant proteinuria, unspecified trimester: Secondary | ICD-10-CM | POA: Diagnosis present

## 2017-09-26 DIAGNOSIS — O10919 Unspecified pre-existing hypertension complicating pregnancy, unspecified trimester: Secondary | ICD-10-CM | POA: Diagnosis present

## 2017-09-26 DIAGNOSIS — T8149XA Infection following a procedure, other surgical site, initial encounter: Secondary | ICD-10-CM | POA: Diagnosis not present

## 2017-09-26 DIAGNOSIS — O403XX0 Polyhydramnios, third trimester, not applicable or unspecified: Secondary | ICD-10-CM | POA: Diagnosis present

## 2017-09-26 DIAGNOSIS — F419 Anxiety disorder, unspecified: Secondary | ICD-10-CM

## 2017-09-26 DIAGNOSIS — O1404 Mild to moderate pre-eclampsia, complicating childbirth: Secondary | ICD-10-CM | POA: Diagnosis present

## 2017-09-26 DIAGNOSIS — Z1389 Encounter for screening for other disorder: Secondary | ICD-10-CM

## 2017-09-26 DIAGNOSIS — O1415 Severe pre-eclampsia, complicating the puerperium: Secondary | ICD-10-CM | POA: Diagnosis not present

## 2017-09-26 DIAGNOSIS — O0990 Supervision of high risk pregnancy, unspecified, unspecified trimester: Secondary | ICD-10-CM

## 2017-09-26 DIAGNOSIS — Z98891 History of uterine scar from previous surgery: Secondary | ICD-10-CM

## 2017-09-26 DIAGNOSIS — K219 Gastro-esophageal reflux disease without esophagitis: Secondary | ICD-10-CM | POA: Diagnosis not present

## 2017-09-26 DIAGNOSIS — O1495 Unspecified pre-eclampsia, complicating the puerperium: Secondary | ICD-10-CM | POA: Diagnosis not present

## 2017-09-26 DIAGNOSIS — Z6841 Body Mass Index (BMI) 40.0 and over, adult: Secondary | ICD-10-CM | POA: Diagnosis not present

## 2017-09-26 DIAGNOSIS — Z5189 Encounter for other specified aftercare: Secondary | ICD-10-CM | POA: Diagnosis not present

## 2017-09-26 DIAGNOSIS — Z883 Allergy status to other anti-infective agents status: Secondary | ICD-10-CM | POA: Diagnosis not present

## 2017-09-26 DIAGNOSIS — Z3A37 37 weeks gestation of pregnancy: Secondary | ICD-10-CM

## 2017-09-26 DIAGNOSIS — O99824 Streptococcus B carrier state complicating childbirth: Secondary | ICD-10-CM | POA: Diagnosis not present

## 2017-09-26 DIAGNOSIS — Z4801 Encounter for change or removal of surgical wound dressing: Secondary | ICD-10-CM | POA: Diagnosis not present

## 2017-09-26 DIAGNOSIS — D649 Anemia, unspecified: Secondary | ICD-10-CM | POA: Diagnosis not present

## 2017-09-26 DIAGNOSIS — O1494 Unspecified pre-eclampsia, complicating childbirth: Secondary | ICD-10-CM | POA: Diagnosis present

## 2017-09-26 DIAGNOSIS — Z331 Pregnant state, incidental: Secondary | ICD-10-CM

## 2017-09-26 HISTORY — DX: Body Mass Index (BMI) 40.0 and over, adult: Z684

## 2017-09-26 HISTORY — DX: Morbid (severe) obesity due to excess calories: E66.01

## 2017-09-26 LAB — CBC
HCT: 31.4 % — ABNORMAL LOW (ref 36.0–46.0)
Hemoglobin: 9.8 g/dL — ABNORMAL LOW (ref 12.0–15.0)
MCH: 24.4 pg — ABNORMAL LOW (ref 26.0–34.0)
MCHC: 31.2 g/dL (ref 30.0–36.0)
MCV: 78.3 fL (ref 78.0–100.0)
PLATELETS: 301 10*3/uL (ref 150–400)
RBC: 4.01 MIL/uL (ref 3.87–5.11)
RDW: 14.3 % (ref 11.5–15.5)
WBC: 8.1 10*3/uL (ref 4.0–10.5)

## 2017-09-26 LAB — TYPE AND SCREEN
ABO/RH(D): O POS
Antibody Screen: NEGATIVE

## 2017-09-26 LAB — COMPREHENSIVE METABOLIC PANEL
ALK PHOS: 73 U/L (ref 38–126)
ALT: 20 U/L (ref 0–44)
ANION GAP: 11 (ref 5–15)
AST: 19 U/L (ref 15–41)
Albumin: 2.7 g/dL — ABNORMAL LOW (ref 3.5–5.0)
BUN: 10 mg/dL (ref 6–20)
CALCIUM: 8.9 mg/dL (ref 8.9–10.3)
CHLORIDE: 106 mmol/L (ref 98–111)
CO2: 18 mmol/L — ABNORMAL LOW (ref 22–32)
CREATININE: 0.47 mg/dL (ref 0.44–1.00)
Glucose, Bld: 120 mg/dL — ABNORMAL HIGH (ref 70–99)
Potassium: 4.2 mmol/L (ref 3.5–5.1)
Sodium: 135 mmol/L (ref 135–145)
Total Bilirubin: 0.4 mg/dL (ref 0.3–1.2)
Total Protein: 6.6 g/dL (ref 6.5–8.1)

## 2017-09-26 LAB — POCT URINALYSIS DIPSTICK OB
Glucose, UA: NEGATIVE
Ketones, UA: NEGATIVE
NITRITE UA: NEGATIVE

## 2017-09-26 LAB — CULTURE, BETA STREP (GROUP B ONLY): STREP GP B CULTURE: POSITIVE — AB

## 2017-09-26 MED ORDER — MISOPROSTOL 50MCG HALF TABLET
50.0000 ug | ORAL_TABLET | ORAL | Status: DC
  Administered 2017-09-26 – 2017-09-27 (×5): 50 ug via ORAL
  Filled 2017-09-26 (×6): qty 1

## 2017-09-26 MED ORDER — ONDANSETRON HCL 4 MG/2ML IJ SOLN: 4.0000 mg | Freq: Four times a day (QID) | INTRAMUSCULAR | Status: DC | PRN

## 2017-09-26 MED ORDER — LACTATED RINGERS IV SOLN
500.0000 mL | INTRAVENOUS | Status: DC | PRN
  Administered 2017-09-28: 500 mL via INTRAVENOUS
  Administered 2017-09-29: 1000 mL via INTRAVENOUS

## 2017-09-26 MED ORDER — LIDOCAINE HCL (PF) 1 % IJ SOLN: 30.0000 mL | INTRAMUSCULAR | Status: DC | PRN

## 2017-09-26 MED ORDER — HYDROXYZINE HCL 50 MG PO TABS: 50.0000 mg | ORAL_TABLET | Freq: Four times a day (QID) | ORAL | Status: DC | PRN

## 2017-09-26 MED ORDER — OXYCODONE-ACETAMINOPHEN 5-325 MG PO TABS: 1.0000 | ORAL_TABLET | ORAL | Status: DC | PRN

## 2017-09-26 MED ORDER — FENTANYL CITRATE (PF) 100 MCG/2ML IJ SOLN
50.0000 ug | INTRAMUSCULAR | Status: DC | PRN
  Administered 2017-09-28 (×2): 100 ug via INTRAVENOUS
  Filled 2017-09-26 (×2): qty 2

## 2017-09-26 MED ORDER — OXYTOCIN 40 UNITS IN LACTATED RINGERS INFUSION - SIMPLE MED: 2.5000 [IU]/h | INTRAVENOUS | Status: DC

## 2017-09-26 MED ORDER — OXYTOCIN BOLUS FROM INFUSION: 500.0000 mL | Freq: Once | INTRAVENOUS | Status: DC

## 2017-09-26 MED ORDER — ACETAMINOPHEN 325 MG PO TABS
650.0000 mg | ORAL_TABLET | ORAL | Status: DC | PRN
  Administered 2017-09-28: 650 mg via ORAL
  Filled 2017-09-26: qty 2

## 2017-09-26 MED ORDER — OXYCODONE-ACETAMINOPHEN 5-325 MG PO TABS: 2.0000 | ORAL_TABLET | ORAL | Status: DC | PRN

## 2017-09-26 MED ORDER — FLEET ENEMA 7-19 GM/118ML RE ENEM: 1.0000 | ENEMA | RECTAL | Status: DC | PRN

## 2017-09-26 MED ORDER — LACTATED RINGERS IV SOLN
INTRAVENOUS | Status: DC
  Administered 2017-09-26 – 2017-09-28 (×3): via INTRAVENOUS

## 2017-09-26 MED ORDER — SOD CITRATE-CITRIC ACID 500-334 MG/5ML PO SOLN
30.0000 mL | ORAL | Status: DC | PRN
  Administered 2017-09-29: 30 mL via ORAL
  Filled 2017-09-26: qty 15

## 2017-09-26 NOTE — Progress Notes (Signed)
US 37 wks,cephalic,fhr 157 bpm,AFI 27 cm polyhydramnios,RI .58,.56,.55=50%,S/D 2.28=45%,EFW 3583 g 92%,AC 98 %,BPD 91%,BPP 8/8  Vitals:   09/26/17 1601 09/26/17 1626  BP: (!) 151/100 (!) 148/93   Admit for IOL d/t GHTN.  Dr. Macon LargeAnyanwu notified.

## 2017-09-26 NOTE — Anesthesia Pain Management Evaluation Note (Signed)
  CRNA Pain Management Visit Note  Patient: Anna [name], 22 y.o., female  "Hello [facility] am a [facility]. We have an anesthesia team available at all times to provide care throughout the hospital, including epidural management and anesthesia for C-section. I don't know your plan for the delivery whether it a natural birth, water birth, IV sedation, nitrous supplementation, doula or epidural, but we want to meet your pain goals."   1.Was your pain managed to your expectations on prior hospitalizations?   No prior hospitalizations  2.What is your expectation for pain management during this hospitalization?     Labor support without medications, Epidural, IV pain meds and Nitrous Oxide  3.How can we help you reach that goal? Pt open to discussion about all methods of pain control. Questions answered.  Record the patient's initial score and the patient's pain goal.   Pain: 0  Pain Goal: 5 [facility] wants you to be able to say your pain was always managed very well.  Fadil Macmaster 09/26/2017

## 2017-09-26 NOTE — Progress Notes (Signed)
US 37 wks,cephalic,fhr 157 bpm,AFI 27 cm polyhydramnios,RI .58,.56,.55=50%,S/D 2.28=45%,EFW 3583 g 92%,AC 98 %,BPD 91%,BPP 8/8

## 2017-09-26 NOTE — H&P (Signed)
Anna [name] is a 22 y.o. female G1P0 with IUP at 55w0dpresenting for IOL for PEC w/o severe features. [facility]  Prenatal History/Complications: Dx w/GHTN on 8/1after multiple SBPs over several days were >140;  24 hour urine was 354 mg/day.  Several PR/CR ratios were done subsequently which were <300, but gold standard testing had already been done, therefore pt met criteria for PEC w/o severe features.  She was placed in twice weekly testing, all reassuring. Her BPs were labile, 116-158/62-92.  Today her BPs were 148/93, 151/100.  [facility].   Mild Polyhydramnios found on today's [place](AFI 27)   Past Medical History: Past Medical History:  Diagnosis Date  . Allergy   . GERD (gastroesophageal reflux disease)   . Hypertension     Past Surgical History: Past Surgical History:  Procedure Laterality Date  . MYRINGOTOMY    . TONSILLECTOMY AND ADENOIDECTOMY      Obstetrical History: OB History    Gravida  1   Para      Term      Preterm      AB      Living        SAB      TAB      Ectopic      Multiple      Live Births              Social History: Social History   Socioeconomic History  . Marital status: Single    Spouse name: Not on file  . Number of children: Not on file  . Years of education: Not on file  . Highest education level: Not on file  Occupational History  . 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 Financial resource strain: Not on file  . Food insecurity:    Worry: Not on file    Inability: Not on file  . Transportation needs:    Medical: Not on file    Non-medical: Not on file  Tobacco Use  . Smoking status: Never Smoker  . Smokeless tobacco: Never Used  Substance and Sexual Activity  . Alcohol use: No  . Drug use: No  . Sexual activity: Not Currently    Birth control/protection: None  Lifestyle  . Physical activity:    Days per week: Not on  file    Minutes per session: Not on file  . Stress: Not on file  Relationships  . Social connections:    Talks on phone: Not on file    Gets together: Not on file    Attends religious service: Not on file    Active member of club or organization: Not on file    Attends meetings of clubs or organizations: Not on file    Relationship status: Not on file  Other Topics Concern  . Not on file  Social History Narrative  . Not on file    Family History: Family History  Problem Relation Age of Onset  . Asthma Mother   . Anemia Mother   . Hypertension Father   . Cancer Maternal Grandfather   . Hypertension Paternal Grandmother   . Heart failure Paternal Grandmother     Allergies: Allergies  Allergen Reactions  . Diflucan [Fluconazole] Rash    Medications Prior to Admission  Medication Sig Dispense Refill Last Dose  . ferrous sulfate 325 (65 FE) MG tablet Take 325  mg by mouth 2 (two) times daily with a meal.   Past Week at Unknown time  . pantoprazole (PROTONIX) 20 MG tablet Take 1 tablet (20 mg total) by mouth daily. 30 tablet 6 Past Week at Unknown time  . Prenatal MV & Min w/FA-DHA (PRENATAL ADULT GUMMY/DHA/FA) 0.4-25 MG CHEW Chew 1 tablet by mouth daily. 30 tablet 11 09/25/2017 at Unknown time  . butalbital-acetaminophen-caffeine (FIORICET, ESGIC) 50-325-40 MG tablet Take 1 tablet by mouth every 6 (six) hours as needed for headache. 20 tablet 0 Taking  . cetirizine (ZYRTEC) 5 MG tablet Take 1 tablet (5 mg total) by mouth daily. 30 tablet 3 Taking  . Prenatal Vit-Fe Fumarate-FA (PNV PRENATAL PLUS MULTIVITAMIN) 27-1 MG TABS Take 1 tablet by mouth daily. 30 tablet 11 Taking        Review of Systems   Constitutional: Negative for fever and chills Eyes: Negative for visual disturbances Respiratory: Negative for shortness of breath, dyspnea Cardiovascular: Negative for chest pain or palpitations  Gastrointestinal: Negative for abdominal pain, vomiting, diarrhea and  constipation.   Genitourinary: Negative for dysuria and urgency Musculoskeletal: Negative for back pain, joint pain, myalgias  Neurological: Negative for dizziness and headaches      Last menstrual period 12/30/2016. General appearance: alert, cooperative and no distress Lungs: clear to auscultation bilaterally Heart: regular rate and rhythm Abdomen: soft, non-tender; bowel sounds normal Extremities: Homans sign is negative, no sign of DVT DTR's 2+ Presentation: cephalic Fetal monitoring  Baseline: 150 bpm, Variability: Good {> 6 bpm), Accelerations: Reactive and Decelerations: Absent Uterine activity  None  Dilation: Closed Effacement (%): Thick Station: Ballotable Exam by:: Manus Gunning CNM    [facility] Results  Initiated care at 10 wk Pap   03/21/17: neg  Dating by 1st trimester U/S 7wk GC/CT Initial:  -/-                  36wks:    -/-   Support Person [employer] NT/IT:   neg                AFP:                 NIPS:   Flu vaccine 03/21/17  CF:  neg                     SMA:                Sickle Cell: neg  Tdap vaccine Recommended ~28wks Blood type    O+               Rhogam:     Antibody    neg  Anatomy US Normal female                                      HIV  neg  Circumcision n/a                                              RPR   neg  Feeding Preference both HBsAg   neg  Pediatrician [place] Rubella  imm  Contraception Undecided, discussed 2hr GTT    Early: normal at 13wks             26-28wks:  81/122/103   Prenatal Classes Online class/tour GBS  (For PCN allergy, check sensitivities)    [place] 37 wks,cephalic,fhr 704 bpm,AFI 27 cm polyhydramnios,RI .58,.56,.55=50%,S/D 2.28=45%,EFW 3583 g 92%,AC 98 %,BPD 91%,BPP 8/8  Prenatal labs: ABO, Rh: O/Positive/-- (02/21 1522) Antibody: Negative (06/21 0908) Rubella: immune RPR: Non Reactive (06/21 0908)  HBsAg: Negative (02/21 1522)  HIV: Non Reactive (06/21 0908)     Prenatal Transfer Tool   Maternal Diabetes: No Genetic Screening: Normal Maternal Ultrasounds/Referrals: Normal Fetal Ultrasounds or other Referrals:  None Maternal Substance Abuse:  No Significant Maternal Medications:  None Significant Maternal Lab Results: Lab values include: Group B Strep positive    Results for orders placed or performed during the hospital encounter of 09/26/17 (from the past 24 hour(s))  CBC   Collection Time: 09/26/17 10:12 PM  Result Value Ref Range   WBC 8.1 4.0 - 10.5 K/uL   RBC 4.01 3.87 - 5.11 MIL/uL   Hemoglobin 9.8 (L) 12.0 - 15.0 g/dL   HCT 31.4 (L) 36.0 - 46.0 %   MCV 78.3 78.0 - 100.0 fL   MCH 24.4 (L) 26.0 - 34.0 pg   MCHC 31.2 30.0 - 36.0 g/dL   RDW 14.3 11.5 - 15.5 %   Platelets 301 150 - 400 K/uL  Results for orders placed or performed in visit on 09/26/17 (from the past 24 hour(s))  POC Urinalysis Dipstick OB   Collection Time: 09/26/17  4:07 PM  Result Value Ref Range   Color, UA     Clarity, UA     Glucose, UA Negative Negative   Bilirubin, UA     Ketones, UA neg    Spec Grav, UA     Blood, UA trace    pH, UA     POC Protein UA Trace Negative, Trace   Urobilinogen, UA     Nitrite, UA neg    Leukocytes, UA Moderate (2+) (A) Negative   Appearance     Odor      Assessment: MISSY BAKSH is a 22 y.o. G1P0 with an IUP at 39w0dpresenting for IOL for PEC w/o severe features.  Mild polyhydramnios  Plan: #Labor: Cytotec->Foley->pitocin #Pain:  Per request #FWB Cat 1 #ID: GBS: PCN    FChristin Fudge8/29/2019, 10:44 PM

## 2017-09-27 LAB — RPR: RPR: NONREACTIVE

## 2017-09-27 LAB — ABO/RH: ABO/RH(D): O POS

## 2017-09-27 MED ORDER — PENICILLIN G 3 MILLION UNITS IVPB - SIMPLE MED
3.0000 10*6.[IU] | INTRAVENOUS | Status: DC
  Administered 2017-09-27 – 2017-09-29 (×12): 3 10*6.[IU] via INTRAVENOUS
  Filled 2017-09-27 (×2): qty 3
  Filled 2017-09-27: qty 100
  Filled 2017-09-27 (×4): qty 3
  Filled 2017-09-27: qty 100
  Filled 2017-09-27 (×2): qty 3
  Filled 2017-09-27: qty 100
  Filled 2017-09-27 (×4): qty 3

## 2017-09-27 MED ORDER — SODIUM CHLORIDE 0.9 % IV SOLN
5.0000 10*6.[IU] | Freq: Once | INTRAVENOUS | Status: AC
  Administered 2017-09-27: 5 10*6.[IU] via INTRAVENOUS
  Filled 2017-09-27: qty 5

## 2017-09-27 NOTE — Progress Notes (Addendum)
12:52 RN at bedside adjusting and assessing US/ FHR.    12:59 Pt. Up to BR. Will call RN when back to bed for FHR monitors to be replaced.  13:20 PT. Back to bed; RN at bedside adjusting FHR monitor.   13:36-13:56 difficulty tracing due to maternal habitus.  RN x2 at bedside adjusting and assessing.

## 2017-09-27 NOTE — Progress Notes (Signed)
OB/GYN Faculty Practice: Labor Progress Note  Subjective: Occasionally feeling contractions. Pain in left side.   Objective: BP (!) 148/103   Pulse 86   Temp 98.4 F (36.9 C) (Oral)   Resp 20   LMP 12/30/2016 (Approximate)  Gen: well-appearing, NAD Dilation: 1 Effacement (%): 50 Station: -3 Presentation: Vertex Exam by:: Dr. Earlene PlaterWallace  Assessment and Plan: 22 y.o. G1P0 7260w1d her for IOL for preeclampsia without severe features.   Induction of Labor Preeclampsia: Induction started with cytotec 09/26/17 at 2230. FB attempted but unsuccessful given patient body habitus, posterior cervix and patient intolerance.  -- cytotec x 4 - s -- pain control: planning for epidural  Fetal Well-Being: EFW 2665g (85%), AC 97% at 33w6. Cephalic by BSUS.  -- Category I - continuous fetal monitoring  -- GBS (+) - PCN   Preeclampsia: UPC 0.21, HELLP labs wnl. Remains symptomatic.  -- continue to monitor BP   Anna [name] S. Earlene PlaterWallace, DO OB/GYN Fellow, Faculty Practice  8:49 PM

## 2017-09-27 NOTE — Progress Notes (Signed)
OB/GYN Faculty Practice: Labor Progress Note  Subjective: Doing well, not feeling any contractions. Mother in room.   Objective: BP (!) 146/87   Pulse 86   Temp 98.9 F (37.2 C) (Oral)   Resp 20   LMP 12/30/2016 (Approximate)  Gen: well-appearing, NAD Dilation: Closed(fingertip outer os) Effacement (%): Thick Station: Ballotable Presentation: Undeterminable Exam by:: Dr. Marlis EdelsonL. Kensi Karr  Assessment and Plan: 22 y.o. G1P0 3213w1d her for IOL for preeclampsia without severe features.   Induction of Labor Preeclampsia: Induction started with cytotec 09/26/17 at 2230.  -- cytotec x 3  -- pain control: planning for epidural  Fetal Well-Being: EFW 2665g (85%), AC 97% at 33w6. Cephalic by BSUS.  -- Category I - continuous fetal monitoring  -- GBS (+) - PCN   Preeclampsia: UPC 0.21, HELLP labs wnl. BP elevated moderate range in clinic yesterday prompting IOL. Asymptomtatic  -- continue to monitor BP   Anna Laymon S. Earlene PlaterWallace, DO OB/GYN Fellow, Faculty Practice  10:06 AM

## 2017-09-27 NOTE — Progress Notes (Signed)
OB/GYN Faculty Practice: Labor Progress Note  Subjective: Family in room. Eating lunch. Has been very difficult to monitor.   Objective: BP (!) 148/92   Pulse 82   Temp 98.9 F (37.2 C) (Oral)   Resp 18   LMP 12/30/2016 (Approximate)  Gen: well-appearing, NAD Dilation: Closed(fingertip outer os) Effacement (%): Thick Station: Ballotable Presentation: Vertex(verified via US) Exam by:: Dr. Marlis EdelsonL. Kalika Smay  Assessment and Plan: 22 y.o. G1P0 3971w1d her for IOL for preeclampsia without severe features.   Induction of Labor Preeclampsia: Induction started with cytotec 09/26/17 at 2230.  -- cytotec x 4 - still FT but starting to effac -- pain control: planning for epidural  Fetal Well-Being: EFW 2665g (85%), AC 97% at 33w6. Cephalic by BSUS.  -- Category I - continuous fetal monitoring  -- GBS (+) - PCN   Preeclampsia: UPC 0.21, HELLP labs wnl. Remains symptomatic.  -- continue to monitor BP   Anna Cada S. Earlene PlaterWallace, DO OB/GYN Fellow, Faculty Practice  3:36 PM

## 2017-09-27 NOTE — Progress Notes (Signed)
Labor Progress Note Anna [name] is a 22 y.o. G1P0 at 2934w1d presented for IOL for PEC w/o severe features.   S: Patient was sleeping comfortably when we arrived and woke up at the end of rounding to say she is feeling well and has no complaints. Patient promptly fell back asleep.  O:  BP (!) 141/82   Pulse 88   Resp 16   LMP 12/30/2016 (Approximate)  EFM: Patient not currently on monitor (intermittent monitoring)  CVE: Dilation: Closed Effacement (%): Thick Station: Ballotable Presentation: Vertex(per US in office today) Exam by:: Drenda FreezeFran CNM  A&P: 22 y.o. G1P0 2934w1d IOL for PEC w/o severe features.  #Labor: Progressing slowly. No change since admission. Will continue cytotec for now.  #Pain: Per request #FWB: Patient is not currently on monitor (intermittent monitoring) #GBS positive (PCN)  Anna [name], Medical Student 4:49 AM

## 2017-09-28 ENCOUNTER — Other Ambulatory Visit: Payer: Self-pay

## 2017-09-28 ENCOUNTER — Inpatient Hospital Stay (HOSPITAL_COMMUNITY): Payer: Federal, State, Local not specified - PPO | Admitting: Anesthesiology

## 2017-09-28 LAB — CBC
HEMATOCRIT: 31.5 % — AB (ref 36.0–46.0)
Hemoglobin: 10 g/dL — ABNORMAL LOW (ref 12.0–15.0)
MCH: 24.7 pg — ABNORMAL LOW (ref 26.0–34.0)
MCHC: 31.7 g/dL (ref 30.0–36.0)
MCV: 77.8 fL — AB (ref 78.0–100.0)
Platelets: 276 10*3/uL (ref 150–400)
RBC: 4.05 MIL/uL (ref 3.87–5.11)
RDW: 14.5 % (ref 11.5–15.5)
WBC: 7.1 10*3/uL (ref 4.0–10.5)

## 2017-09-28 MED ORDER — PROMETHAZINE HCL 25 MG/ML IJ SOLN
25.0000 mg | Freq: Once | INTRAMUSCULAR | Status: AC
  Administered 2017-09-28: 25 mg via INTRAVENOUS
  Filled 2017-09-28: qty 1

## 2017-09-28 MED ORDER — MISOPROSTOL 200 MCG PO TABS
ORAL_TABLET | ORAL | Status: AC
  Filled 2017-09-28: qty 1

## 2017-09-28 MED ORDER — MISOPROSTOL 50MCG HALF TABLET
50.0000 ug | ORAL_TABLET | ORAL | Status: DC
  Filled 2017-09-28 (×7): qty 1

## 2017-09-28 MED ORDER — BUTORPHANOL TARTRATE 1 MG/ML IJ SOLN
2.0000 mg | Freq: Once | INTRAMUSCULAR | Status: AC
  Administered 2017-09-28: 2 mg via INTRAVENOUS
  Filled 2017-09-28: qty 2

## 2017-09-28 MED ORDER — ZOLPIDEM TARTRATE 5 MG PO TABS
5.0000 mg | ORAL_TABLET | Freq: Once | ORAL | Status: AC
  Administered 2017-09-28: 5 mg via ORAL
  Filled 2017-09-28: qty 1

## 2017-09-28 MED ORDER — MISOPROSTOL 25 MCG QUARTER TABLET
25.0000 ug | ORAL_TABLET | ORAL | Status: DC
  Administered 2017-09-28: 25 ug via VAGINAL
  Filled 2017-09-28: qty 1

## 2017-09-28 MED ORDER — OXYTOCIN 40 UNITS IN LACTATED RINGERS INFUSION - SIMPLE MED
1.0000 m[IU]/min | INTRAVENOUS | Status: DC
  Administered 2017-09-28: 2 m[IU]/min via INTRAVENOUS
  Filled 2017-09-28: qty 1000

## 2017-09-28 MED ORDER — PROMETHAZINE HCL 25 MG/ML IJ SOLN
12.5000 mg | Freq: Once | INTRAMUSCULAR | Status: AC
  Administered 2017-09-28: 12.5 mg via INTRAVENOUS
  Filled 2017-09-28: qty 1

## 2017-09-28 NOTE — Progress Notes (Signed)
Patient in restroom from [phone]. Monitors reapplied and RN assessing.

## 2017-09-28 NOTE — Progress Notes (Signed)
Patient ID: Anna [name], female   DOB: 02/15/1995, 22 y.o.   MRN: [id number]  S/p cervical foley, but still 1+cm; attempt by Pincus BadderK Janye Maynor CNM to place foley unsuccessful due to pt discomfort; Dr Emelda FearFerguson requested to assist- with speculum, foley successfully placed and inflated with 60cc of fluid; will continue cytotec dosing, and plan Pit for when foley comes out. FHR 120-125, +accels, no decels BP 130/69, other VSS  [name] CNM 09/28/2017 11:01 AM

## 2017-09-28 NOTE — Progress Notes (Signed)
Fetal tachycardia with borderline minimal variability. Will change positions and give bolus of fluids.

## 2017-09-28 NOTE — Progress Notes (Signed)
Elwin Sleightanautica S Bruins is a 22 y.o. G1P0 at 6464w2d  admitted for induction of labor due to pree-eclampisia without severe features.  Subjective: pt had severe difficulty tolerating vaginal exams.   Objective: BP 133/82   Pulse 74   Temp 98.1 F (36.7 C) (Oral)   Resp 18   LMP 12/30/2016 (Approximate)  No intake/output data recorded. No intake/output data recorded. Had u/s this week for EFW and BPP, EFW 7+12 by pt report FHT:  FHR: 145 bpm, variability: moderate,  accelerations:  Present,  decelerations:  Present were present earlier, not resolved UC:   irregular, every 4-7 minutes SVE:   Dilation: 1 Effacement (%): 50 Station: -3 Exam by:: Dr. Earlene PlaterWallace  Labs: Lab Results  Component Value Date   WBC 8.1 09/26/2017   HGB 9.8 (L) 09/26/2017   HCT 31.4 (L) 09/26/2017   MCV 78.3 09/26/2017   PLT 301 09/26/2017    Assessment / Plan: IOL for  PRE-E without severe features, s/p cytotec x 6 and Foley bulb.  Pt unable to tolerate vaginal exams.   Labor: still ripening with cytotec, will allow to void then try to re-examine Preeclampsia:   Fetal Wellbeing:  Category I Pain Control:  Labor support without medications, epidural I/D:  n/a Anticipated MOD:  uncertain.  Tilda BurrowJohn V Sabiha Sura 09/28/2017, 9:13 AM

## 2017-09-28 NOTE — Progress Notes (Signed)
[id number] pt in restroom; monitors reapplied, RN assessing

## 2017-09-28 NOTE — Progress Notes (Signed)
OB/[facility]N Faculty [facility]: Labor Progress Note  Subjective: Family in room.   Objective: BP (!) 149/85   Pulse 92   Temp 98.3 F (36.8 C) (Oral)   Resp 20   LMP 12/30/2016 (Approximate)  Gen: well-appearing, NAD Dilation: 1 Effacement (%): 50 Station: -3 Presentation: Vertex Exam by:: Dr. Earlene PlaterWallace  Assessment and Plan: 22 y.o. G1P0 5240w1d her for IOL for preeclampsia without severe features.   Induction of Labor Preeclampsia: Induction started with cytotec 09/26/17 at 2230. FB attempted but unsuccessful given patient body habitus, posterior cervix and patient intolerance.  -- cytotec x 6  -- FB placed 0200 -- pain control: planning for epidural  Fetal Well-Being: EFW 2665g (85%), AC 97% at 33w6. Cephalic by BSUS.  -- Category I - continuous fetal monitoring  -- GBS (+) - PCN   Preeclampsia: UPC 0.21, HELLP labs wnl. Remains symptomatic.  -- continue to monitor BP   Anna [name] S. Earlene PlaterWallace, DO OB/GYN Fellow, Faculty Practice  2:11 AM

## 2017-09-28 NOTE — Anesthesia Preprocedure Evaluation (Addendum)
Anesthesia Evaluation  Patient identified by MRN, date of birth, ID band Patient awake    Reviewed: Allergy & Precautions, NPO status , Patient's Chart, lab work & pertinent test results  Airway Mallampati: IV  TM Distance: >3 FB Neck ROM: Full    Dental no notable dental hx. (+) Teeth Intact, Dental Advisory Given   Pulmonary neg pulmonary ROS,    Pulmonary exam normal breath sounds clear to auscultation       Cardiovascular hypertension, Normal cardiovascular exam Rhythm:Regular Rate:Normal     Neuro/Psych negative neurological ROS  negative psych ROS   GI/Hepatic negative GI ROS, Neg liver ROS, GERD  ,  Endo/Other  negative endocrine ROSMorbid obesity  Renal/GU negative Renal ROS  negative genitourinary   Musculoskeletal negative musculoskeletal ROS (+)   Abdominal   Peds  Hematology negative hematology ROS (+)   Anesthesia Other Findings   Reproductive/Obstetrics (+) Pregnancy gHTN                            Anesthesia Physical Anesthesia Plan  ASA: IV  Anesthesia Plan: Epidural   Post-op Pain Management:    Induction:   PONV Risk Score and Plan: Treatment may vary due to age or medical condition  Airway Management Planned: Natural Airway and Nasal Cannula  Additional Equipment:   Intra-op Plan:   Post-operative Plan:   Informed Consent: I have reviewed the patients History and Physical, chart, labs and discussed the procedure including the risks, benefits and alternatives for the proposed anesthesia with the patient or authorized representative who has indicated his/her understanding and acceptance.   Dental advisory given  Plan Discussed with:   Anesthesia Plan Comments:         Anesthesia Quick Evaluation

## 2017-09-28 NOTE — Progress Notes (Signed)
Patient ID: Elwin Sleightanautica S Jobe, female   DOB: 07-13-1995, 22 y.o.   MRN: [id number]  S/p cervical foley; getting epidural cath placed  BP 132/67, P 73 FHR 140s, +10x10accels Ctx difficult to trace Cx 4.5/50/vtx -3  IUP@term  gHTN Cx favorable  Will begin Pit after epidural cath placed; will dose for labor when pt requests  Cam HaiSHAW, KIMBERLY CNM 09/28/2017 4:14 PM

## 2017-09-28 NOTE — Progress Notes (Signed)
FB still in place. Will give one additional dose of Cytotec and then consider transitioning to pitocin after next check.

## 2017-09-28 NOTE — Progress Notes (Signed)
Labor Progress Note Elwin Sleightanautica S Tirone is a 22 y.o. G1P0 at 7741w2d presented for IOL for pre-eclampsia w/o severe features  S:   O:  BP 132/67   Pulse 73   Temp 98.4 F (36.9 C) (Oral)   Resp 16   Ht 5\' 2"  (1.575 m)   LMP 12/30/2016 (Approximate)   BMI 74.48 kg/m  EFM: 150/moderate var/positive accels/prolonged decel  CVE: Dilation: 1.5 Effacement (%): Thick Cervical Position: Posterior Station: -3 Presentation: Vertex Exam by:: Philipp DeputyKim Shaw CNM   A&P: 22 y.o. G1P0 5741w2d here for IOL for pre-eclampsia #Labor: on cytotec x6 and FB(~1100) #Pain: poorly controlled with fentanyl #FWB: category II #GBS positive  Mirian MoPeter Elga Santy, MD 3:23 PM

## 2017-09-28 NOTE — Progress Notes (Signed)
[id number] pt position being changed, pt requested to use the restroom, monitors being adjusted. CNM and MD notified and reviewed FHR tracing and difficulty monitoring. Pt updated and aware.

## 2017-09-28 NOTE — Progress Notes (Signed)
[id number] RN able to doppler FHR while anesthesia at bedside placing epidural.

## 2017-09-29 ENCOUNTER — Encounter (HOSPITAL_COMMUNITY): Payer: Self-pay | Admitting: Anesthesiology

## 2017-09-29 ENCOUNTER — Encounter (HOSPITAL_COMMUNITY)
Admission: AD | Disposition: A | Discharge status: Home / Self Care | Payer: Self-pay | Source: Home / Self Care | Attending: Obstetrics and Gynecology

## 2017-09-29 DIAGNOSIS — O1494 Unspecified pre-eclampsia, complicating childbirth: Secondary | ICD-10-CM

## 2017-09-29 DIAGNOSIS — O99824 Streptococcus B carrier state complicating childbirth: Secondary | ICD-10-CM

## 2017-09-29 DIAGNOSIS — Z3A37 37 weeks gestation of pregnancy: Secondary | ICD-10-CM

## 2017-09-29 DIAGNOSIS — O1490 Unspecified pre-eclampsia, unspecified trimester: Secondary | ICD-10-CM

## 2017-09-29 HISTORY — DX: Morbid (severe) obesity due to excess calories: E66.01

## 2017-09-29 SURGERY — Surgical Case
Anesthesia: Epidural | Site: Abdomen | Wound class: Clean Contaminated

## 2017-09-29 MED ORDER — PHENYLEPHRINE 40 MCG/ML (10ML) SYRINGE FOR IV PUSH (FOR BLOOD PRESSURE SUPPORT)
PREFILLED_SYRINGE | INTRAVENOUS | Status: AC
  Filled 2017-09-29: qty 20

## 2017-09-29 MED ORDER — PHENYLEPHRINE HCL 10 MG/ML IJ SOLN: INTRAMUSCULAR | Status: DC | PRN

## 2017-09-29 MED ORDER — LACTATED RINGERS IV SOLN: INTRAVENOUS | Status: DC | PRN

## 2017-09-29 MED ORDER — OXYTOCIN 10 UNIT/ML IJ SOLN
INTRAMUSCULAR | Status: AC
  Filled 2017-09-29: qty 4

## 2017-09-29 MED ORDER — PROMETHAZINE HCL 25 MG/ML IJ SOLN: 6.2500 mg | INTRAMUSCULAR | Status: DC | PRN

## 2017-09-29 MED ORDER — WITCH HAZEL-GLYCERIN EX PADS: 1.0000 "application " | MEDICATED_PAD | CUTANEOUS | Status: DC | PRN

## 2017-09-29 MED ORDER — OXYTOCIN 40 UNITS IN LACTATED RINGERS INFUSION - SIMPLE MED: 2.5000 [IU]/h | INTRAVENOUS | Status: AC

## 2017-09-29 MED ORDER — EPHEDRINE 5 MG/ML INJ
10.0000 mg | INTRAVENOUS | Status: DC | PRN
  Filled 2017-09-29: qty 4

## 2017-09-29 MED ORDER — PHENYLEPHRINE 40 MCG/ML (10ML) SYRINGE FOR IV PUSH (FOR BLOOD PRESSURE SUPPORT)
80.0000 ug | PREFILLED_SYRINGE | INTRAVENOUS | Status: DC | PRN
  Administered 2017-09-29: 120 ug via INTRAVENOUS
  Administered 2017-09-29: 80 ug via INTRAVENOUS

## 2017-09-29 MED ORDER — SIMETHICONE 80 MG PO CHEW: 80.0000 mg | CHEWABLE_TABLET | ORAL | Status: DC | PRN

## 2017-09-29 MED ORDER — MEPERIDINE HCL 25 MG/ML IJ SOLN: INTRAMUSCULAR | Status: DC | PRN

## 2017-09-29 MED ORDER — PHENYLEPHRINE 40 MCG/ML (10ML) SYRINGE FOR IV PUSH (FOR BLOOD PRESSURE SUPPORT)
PREFILLED_SYRINGE | INTRAVENOUS | Status: AC
  Administered 2017-09-29: 80 ug via INTRAVENOUS
  Filled 2017-09-29: qty 10

## 2017-09-29 MED ORDER — FENTANYL CITRATE (PF) 100 MCG/2ML IJ SOLN
INTRAMUSCULAR | Status: AC
  Filled 2017-09-29: qty 2

## 2017-09-29 MED ORDER — DEXAMETHASONE SODIUM PHOSPHATE 10 MG/ML IJ SOLN
INTRAMUSCULAR | Status: AC
  Filled 2017-09-29: qty 1

## 2017-09-29 MED ORDER — ONDANSETRON HCL 4 MG/2ML IJ SOLN
INTRAMUSCULAR | Status: AC
  Filled 2017-09-29: qty 2

## 2017-09-29 MED ORDER — SIMETHICONE 80 MG PO CHEW
80.0000 mg | CHEWABLE_TABLET | Freq: Three times a day (TID) | ORAL | Status: DC
  Administered 2017-09-29 – 2017-10-01 (×5): 80 mg via ORAL
  Filled 2017-09-29 (×5): qty 1

## 2017-09-29 MED ORDER — ENOXAPARIN SODIUM 100 MG/ML ~~LOC~~ SOLN
90.0000 mg | SUBCUTANEOUS | Status: DC
  Administered 2017-09-30 – 2017-10-01 (×2): 90 mg via SUBCUTANEOUS
  Filled 2017-09-29 (×3): qty 1

## 2017-09-29 MED ORDER — SIMETHICONE 80 MG PO CHEW
80.0000 mg | CHEWABLE_TABLET | ORAL | Status: DC
  Administered 2017-09-30 (×2): 80 mg via ORAL
  Filled 2017-09-29 (×2): qty 1

## 2017-09-29 MED ORDER — MEPERIDINE HCL 25 MG/ML IJ SOLN
INTRAMUSCULAR | Status: AC
  Filled 2017-09-29: qty 1

## 2017-09-29 MED ORDER — LACTATED RINGERS IV SOLN
INTRAVENOUS | Status: DC
  Administered 2017-09-29 – 2017-09-30 (×4): via INTRAVENOUS

## 2017-09-29 MED ORDER — DEXAMETHASONE SODIUM PHOSPHATE 4 MG/ML IJ SOLN
INTRAMUSCULAR | Status: AC
  Filled 2017-09-29: qty 1

## 2017-09-29 MED ORDER — SODIUM BICARBONATE 8.4 % IV SOLN: INTRAVENOUS | Status: DC | PRN

## 2017-09-29 MED ORDER — METOCLOPRAMIDE HCL 5 MG/ML IJ SOLN: INTRAMUSCULAR | Status: DC | PRN

## 2017-09-29 MED ORDER — HYDROMORPHONE HCL 1 MG/ML IJ SOLN: 0.2500 mg | INTRAMUSCULAR | Status: DC | PRN

## 2017-09-29 MED ORDER — ONDANSETRON HCL 4 MG/2ML IJ SOLN
INTRAMUSCULAR | Status: DC | PRN
  Administered 2017-09-29: 4 mg via INTRAVENOUS

## 2017-09-29 MED ORDER — TERBUTALINE SULFATE 1 MG/ML IJ SOLN
INTRAMUSCULAR | Status: AC
  Filled 2017-09-29: qty 1

## 2017-09-29 MED ORDER — SCOPOLAMINE 1 MG/3DAYS TD PT72
MEDICATED_PATCH | TRANSDERMAL | Status: AC
  Filled 2017-09-29: qty 1

## 2017-09-29 MED ORDER — IBUPROFEN 600 MG PO TABS
600.0000 mg | ORAL_TABLET | Freq: Four times a day (QID) | ORAL | Status: DC
  Administered 2017-09-29 – 2017-10-01 (×8): 600 mg via ORAL
  Filled 2017-09-29 (×8): qty 1

## 2017-09-29 MED ORDER — FENTANYL CITRATE (PF) 100 MCG/2ML IJ SOLN
INTRAMUSCULAR | Status: DC | PRN
  Administered 2017-09-29 (×2): 50 ug via INTRAVENOUS
  Administered 2017-09-29: 100 ug via EPIDURAL

## 2017-09-29 MED ORDER — ACETAMINOPHEN 325 MG PO TABS
650.0000 mg | ORAL_TABLET | ORAL | Status: DC | PRN
  Administered 2017-09-29: 650 mg via ORAL
  Filled 2017-09-29: qty 2

## 2017-09-29 MED ORDER — DIPHENHYDRAMINE HCL 25 MG PO CAPS: 25.0000 mg | ORAL_CAPSULE | Freq: Four times a day (QID) | ORAL | Status: DC | PRN

## 2017-09-29 MED ORDER — OXYTOCIN 10 UNIT/ML IJ SOLN
INTRAVENOUS | Status: DC | PRN
  Administered 2017-09-29: 40 [IU] via INTRAVENOUS

## 2017-09-29 MED ORDER — SODIUM CHLORIDE 0.9 % IV SOLN
500.0000 mg | INTRAVENOUS | Status: DC
  Administered 2017-09-29: 500 mg via INTRAVENOUS
  Filled 2017-09-29: qty 500

## 2017-09-29 MED ORDER — PHENYLEPHRINE 40 MCG/ML (10ML) SYRINGE FOR IV PUSH (FOR BLOOD PRESSURE SUPPORT)
80.0000 ug | PREFILLED_SYRINGE | INTRAVENOUS | Status: DC | PRN
  Filled 2017-09-29: qty 10

## 2017-09-29 MED ORDER — MENTHOL 3 MG MT LOZG: 1.0000 | LOZENGE | OROMUCOSAL | Status: DC | PRN

## 2017-09-29 MED ORDER — ZOLPIDEM TARTRATE 5 MG PO TABS: 5.0000 mg | ORAL_TABLET | Freq: Every evening | ORAL | Status: DC | PRN

## 2017-09-29 MED ORDER — STERILE WATER FOR IRRIGATION IR SOLN
Status: DC | PRN
  Administered 2017-09-29: 1000 mL

## 2017-09-29 MED ORDER — SENNOSIDES-DOCUSATE SODIUM 8.6-50 MG PO TABS
2.0000 | ORAL_TABLET | ORAL | Status: DC
  Administered 2017-09-30 (×2): 2 via ORAL
  Filled 2017-09-29 (×2): qty 2

## 2017-09-29 MED ORDER — COCONUT OIL OIL: 1.0000 "application " | TOPICAL_OIL | Status: DC | PRN

## 2017-09-29 MED ORDER — MIDAZOLAM HCL 2 MG/2ML IJ SOLN
INTRAMUSCULAR | Status: AC
  Filled 2017-09-29: qty 2

## 2017-09-29 MED ORDER — ONDANSETRON HCL 4 MG/2ML IJ SOLN: INTRAMUSCULAR | Status: DC | PRN

## 2017-09-29 MED ORDER — OXYTOCIN 10 UNIT/ML IJ SOLN: INTRAVENOUS | Status: DC | PRN

## 2017-09-29 MED ORDER — PHENYLEPHRINE 40 MCG/ML (10ML) SYRINGE FOR IV PUSH (FOR BLOOD PRESSURE SUPPORT)
PREFILLED_SYRINGE | INTRAVENOUS | Status: AC
  Filled 2017-09-29: qty 10

## 2017-09-29 MED ORDER — FENTANYL CITRATE (PF) 100 MCG/2ML IJ SOLN: INTRAMUSCULAR | Status: DC | PRN

## 2017-09-29 MED ORDER — LORATADINE 10 MG PO TABS
10.0000 mg | ORAL_TABLET | Freq: Every day | ORAL | Status: DC
  Administered 2017-09-30 – 2017-10-01 (×2): 10 mg via ORAL
  Filled 2017-09-29 (×2): qty 1

## 2017-09-29 MED ORDER — LACTATED RINGERS IV SOLN: 500.0000 mL | Freq: Once | INTRAVENOUS | Status: DC

## 2017-09-29 MED ORDER — ACETAMINOPHEN 10 MG/ML IV SOLN: 1000.0000 mg | Freq: Once | INTRAVENOUS | Status: DC | PRN

## 2017-09-29 MED ORDER — DIPHENHYDRAMINE HCL 50 MG/ML IJ SOLN: 12.5000 mg | INTRAMUSCULAR | Status: DC | PRN

## 2017-09-29 MED ORDER — PHENYLEPHRINE HCL 10 MG/ML IJ SOLN
INTRAMUSCULAR | Status: DC | PRN
  Administered 2017-09-29 (×8): 100 ug via INTRAVENOUS

## 2017-09-29 MED ORDER — MEPERIDINE HCL 25 MG/ML IJ SOLN: 6.2500 mg | INTRAMUSCULAR | Status: DC | PRN

## 2017-09-29 MED ORDER — CEFAZOLIN SODIUM-DEXTROSE 2-3 GM-%(50ML) IV SOLR: INTRAVENOUS | Status: DC | PRN

## 2017-09-29 MED ORDER — LACTATED RINGERS IV SOLN
INTRAVENOUS | Status: DC | PRN
  Administered 2017-09-29: 10:00:00 via INTRAVENOUS

## 2017-09-29 MED ORDER — SODIUM BICARBONATE 8.4 % IV SOLN
INTRAVENOUS | Status: DC | PRN
  Administered 2017-09-29 (×3): 5 mL via EPIDURAL

## 2017-09-29 MED ORDER — HYDROMORPHONE HCL 1 MG/ML IJ SOLN: 1.0000 mg | INTRAMUSCULAR | Status: DC | PRN

## 2017-09-29 MED ORDER — TETANUS-DIPHTH-ACELL PERTUSSIS 5-2.5-18.5 LF-MCG/0.5 IM SUSP: 0.5000 mL | Freq: Once | INTRAMUSCULAR | Status: DC

## 2017-09-29 MED ORDER — MORPHINE SULFATE (PF) 0.5 MG/ML IJ SOLN: INTRAMUSCULAR | Status: DC | PRN

## 2017-09-29 MED ORDER — MORPHINE SULFATE (PF) 0.5 MG/ML IJ SOLN
INTRAMUSCULAR | Status: AC
  Filled 2017-09-29: qty 10

## 2017-09-29 MED ORDER — FENTANYL 2.5 MCG/ML BUPIVACAINE 1/10 % EPIDURAL INFUSION (WH - ANES)
INTRAMUSCULAR | Status: AC
  Filled 2017-09-29: qty 100

## 2017-09-29 MED ORDER — BUPIVACAINE HCL (PF) 0.25 % IJ SOLN
INTRAMUSCULAR | Status: DC | PRN
  Administered 2017-09-29 (×2): 5 mL via EPIDURAL

## 2017-09-29 MED ORDER — LIDOCAINE HCL (PF) 1 % IJ SOLN
INTRAMUSCULAR | Status: DC | PRN
  Administered 2017-09-28: 3 mL via EPIDURAL

## 2017-09-29 MED ORDER — CEFAZOLIN SODIUM-DEXTROSE 2-3 GM-%(50ML) IV SOLR
INTRAVENOUS | Status: DC | PRN
  Administered 2017-09-29 (×2): 2 g via INTRAVENOUS

## 2017-09-29 MED ORDER — FENTANYL 2.5 MCG/ML BUPIVACAINE 1/10 % EPIDURAL INFUSION (WH - ANES)
14.0000 mL/h | INTRAMUSCULAR | Status: DC | PRN
  Administered 2017-09-29: 8 mL/h via EPIDURAL

## 2017-09-29 MED ORDER — DIBUCAINE 1 % RE OINT: 1.0000 "application " | TOPICAL_OINTMENT | RECTAL | Status: DC | PRN

## 2017-09-29 MED ORDER — PRENATAL MULTIVITAMIN CH
1.0000 | ORAL_TABLET | Freq: Every day | ORAL | Status: DC
  Administered 2017-09-30 – 2017-10-01 (×2): 1 via ORAL
  Filled 2017-09-29 (×2): qty 1

## 2017-09-29 MED ORDER — LIDOCAINE-EPINEPHRINE (PF) 2 %-1:200000 IJ SOLN
INTRAMUSCULAR | Status: AC
  Filled 2017-09-29: qty 20

## 2017-09-29 MED ORDER — HYDROCODONE-ACETAMINOPHEN 7.5-325 MG PO TABS: 1.0000 | ORAL_TABLET | Freq: Once | ORAL | Status: DC | PRN

## 2017-09-29 MED ORDER — LIDOCAINE-EPINEPHRINE (PF) 2 %-1:200000 IJ SOLN: INTRAMUSCULAR | Status: DC | PRN

## 2017-09-29 MED ORDER — EPHEDRINE 5 MG/ML INJ
10.0000 mg | INTRAVENOUS | Status: DC | PRN
  Administered 2017-09-29: 30 mg via INTRAVENOUS

## 2017-09-29 MED ORDER — SODIUM CHLORIDE 0.9 % IR SOLN
Status: DC | PRN
  Administered 2017-09-29: 1000 mL

## 2017-09-29 MED ORDER — MIDAZOLAM HCL 2 MG/2ML IJ SOLN
INTRAMUSCULAR | Status: DC | PRN
  Administered 2017-09-29: 2 mg via INTRAVENOUS

## 2017-09-29 SURGICAL SUPPLY — 39 items
APL SKNCLS STERI-STRIP NONHPOA (GAUZE/BANDAGES/DRESSINGS)
BENZOIN TINCTURE PRP APPL 2/3 (GAUZE/BANDAGES/DRESSINGS) IMPLANT
CHLORAPREP W/TINT 26ML (MISCELLANEOUS) ×2 IMPLANT
CLAMP CORD UMBIL (MISCELLANEOUS) ×1 IMPLANT
CLOTH BEACON ORANGE TIMEOUT ST (SAFETY) ×2 IMPLANT
DRSG OPSITE POSTOP 4X10 (GAUZE/BANDAGES/DRESSINGS) ×2 IMPLANT
DRSG TEGADERM 8X12 (GAUZE/BANDAGES/DRESSINGS) ×1 IMPLANT
ELECT BLADE 6.5 EXT (BLADE) ×1 IMPLANT
ELECT REM PT RETURN 9FT ADLT (ELECTROSURGICAL) ×2
ELECTRODE REM PT RTRN 9FT ADLT (ELECTROSURGICAL) ×1 IMPLANT
EXTENDER TRAXI PANNICULUS (MISCELLANEOUS) IMPLANT
GLOVE BIOGEL PI IND STRL 7.0 (GLOVE) ×1 IMPLANT
GLOVE BIOGEL PI IND STRL 9 (GLOVE) ×1 IMPLANT
GLOVE BIOGEL PI INDICATOR 7.0 (GLOVE) ×1
GLOVE BIOGEL PI INDICATOR 9 (GLOVE) ×1
GLOVE SS PI 9.0 STRL (GLOVE) ×2 IMPLANT
GOWN STRL REUS W/TWL 2XL LVL3 (GOWN DISPOSABLE) ×2 IMPLANT
GOWN STRL REUS W/TWL LRG LVL3 (GOWN DISPOSABLE) ×2 IMPLANT
HOVERMATT SINGLE USE (MISCELLANEOUS) ×1 IMPLANT
KIT PREVENA INCISION MGT20CM45 (CANNISTER) ×1 IMPLANT
NS IRRIG 1000ML POUR BTL (IV SOLUTION) ×2 IMPLANT
PACK C SECTION WH (CUSTOM PROCEDURE TRAY) ×2 IMPLANT
PAD OB MATERNITY 4.3X12.25 (PERSONAL CARE ITEMS) ×2 IMPLANT
PENCIL SMOKE EVAC W/HOLSTER (ELECTROSURGICAL) ×2 IMPLANT
RETRACTOR TRAXI PANNICULUS (MISCELLANEOUS) IMPLANT
RTRCTR C-SECT PINK 25CM LRG (MISCELLANEOUS) ×1 IMPLANT
STRIP CLOSURE SKIN 1/2X4 (GAUZE/BANDAGES/DRESSINGS) IMPLANT
SUT MNCRL 0 VIOLET CTX 36 (SUTURE) ×2 IMPLANT
SUT MONOCRYL 0 CTX 36 (SUTURE) ×2
SUT PLAIN 2 0 XLH (SUTURE) ×2 IMPLANT
SUT VIC AB 0 CT1 27 (SUTURE) ×2
SUT VIC AB 0 CT1 27XBRD ANBCTR (SUTURE) ×1 IMPLANT
SUT VIC AB 0 CT1 36 (SUTURE) ×1 IMPLANT
SUT VIC AB 2-0 CT1 27 (SUTURE) ×2
SUT VIC AB 2-0 CT1 TAPERPNT 27 (SUTURE) ×1 IMPLANT
SUT VIC AB 4-0 KS 27 (SUTURE) ×2 IMPLANT
TOWEL OR 17X24 6PK STRL BLUE (TOWEL DISPOSABLE) ×2 IMPLANT
TRAXI PANNICULUS EXTENDER (MISCELLANEOUS) ×3
TRAXI PANNICULUS RETRACTOR (MISCELLANEOUS) ×1

## 2017-09-29 NOTE — Addendum Note (Signed)
Addendum  created 09/29/17 1619 by Shanon Payor, CRNA   Intraprocedure Event edited

## 2017-09-29 NOTE — Addendum Note (Signed)
Addendum  created 09/29/17 1627 by Shanon Payor, CRNA   Intraprocedure LDAs edited, Intraprocedure Meds edited, LDA properties accepted

## 2017-09-29 NOTE — Progress Notes (Signed)
Patient ID: Anna [name], female   DOB: 06-05-1995, 22 y.o.   MRN: [id number]  CTSP for FHR decel after epidural placement and low BP. AROM for copious clear fluid; IFSE unable to be successfully placed by me; IUPC placed. Cx 3-4/thick/vtx -3. FHR via cardio 160s. Dr Emelda Fear in to room and he placed IFSE. Plan to restart Pit. Comfortable with epidural.  Cam Hai CNM 09/29/2017 5:17 AM

## 2017-09-29 NOTE — Anesthesia Procedure Notes (Signed)
Epidural Patient location during procedure: OB Start time: 09/28/2017 5:00 PM End time: 09/28/2017 5:40 PM  Staffing Anesthesiologist: Elmer Picker, MD Performed: anesthesiologist   Preanesthetic Checklist Completed: patient identified, pre-op evaluation, timeout performed, IV checked, risks and benefits discussed and monitors and equipment checked  Epidural Patient position: sitting Prep: site prepped and draped and DuraPrep Patient monitoring: continuous pulse ox, blood pressure, heart rate and cardiac monitor Approach: midline Location: L3-L4 Injection technique: LOR air  Needle:  Needle type: Tuohy  Needle gauge: 17 G Needle length: 9 cm Needle insertion depth: 10 cm Catheter type: closed end flexible Catheter size: 19 Gauge Catheter at skin depth: 15 cm Test dose: negative  Assessment Sensory level: T8 Events: blood not aspirated, injection not painful, no injection resistance, negative IV test and no paresthesia  Additional Notes Patient identified. Risks/Benefits/Options discussed with patient including but not limited to bleeding, infection, nerve damage, paralysis, failed block, incomplete pain control, headache, blood pressure changes, nausea, vomiting, reactions to medication both or allergic, itching and postpartum back pain. Confirmed with bedside nurse the patient's most recent platelet count. Confirmed with patient that they are not currently taking any anticoagulation, have any bleeding history or any family history of bleeding disorders. Patient expressed understanding and wished to proceed. All questions were answered. Sterile technique was used throughout the entire procedure. Please see nursing notes for vital signs. Test dose was given through epidural catheter and negative prior to continuing to dose epidural or start infusion. Warning signs of high block given to the patient including shortness of breath, tingling/numbness in hands, complete motor block,  or any concerning symptoms with instructions to call for help. Patient was given instructions on fall risk and not to get out of bed. All questions and concerns addressed with instructions to call with any issues or inadequate analgesia.  Reason for block:procedure for pain

## 2017-09-29 NOTE — Op Note (Signed)
Please see the brief op note for details. 

## 2017-09-29 NOTE — Progress Notes (Signed)
Patient ID: Anna [name], female   DOB: 11/01/1995, 22 y.o.   MRN: [id number]  Current plan is for pt to have a break from Pit from MN-4am. She has been on Pit for the better part of the afternoon/evening and baby/ctx are difficult to trace and pt isn't interested in epidural being dosed currently (has dry cath placed).   BP 136/63, P 71 FHR stable Cx deferred, but last exam was 4-5/50  IUP@term  gHTN- stable BPs IOL process  Dr Emelda Fear will plan AROM and internals around 0400  [name] CNM 09/29/2017 1:11 AM

## 2017-09-29 NOTE — Addendum Note (Signed)
Addendum  created 09/29/17 1634 by Shanon Payor, CRNA   Sign clinical note

## 2017-09-29 NOTE — Addendum Note (Signed)
Addendum  created 09/29/17 1639 by Shanon Payor, CRNA   [facility] administration accepted

## 2017-09-29 NOTE — Transfer of Care (Addendum)
Immediate Anesthesia Transfer of Care Note  Patient: Anna [name]  Procedure(s) Performed: CESAREAN SECTION (N/A Abdomen)  Patient Location: PACU  Anesthesia Type:Epidural  Level of Consciousness: awake, alert  and oriented  Airway & Oxygen Therapy: Patient Spontanous Breathing  Post-op Assessment: Report given to RN and Post -op Vital signs reviewed and stable  Post vital signs: Reviewed and stable  Last Vitals:  Vitals Value Taken Time  BP    Temp    Pulse    Resp    SpO2      Last Pain:  Vitals:   09/29/17 0800  TempSrc:   PainSc: 0-No pain      Patients Stated Pain Goal: 3 (09/27/17 2358)  Complications: No apparent anesthesia complications

## 2017-09-29 NOTE — Addendum Note (Signed)
Addendum  created 09/29/17 1618 by Shanon Payor, CRNA   Attestation recorded in Intraprocedure, Intraprocedure Attestations filed, Intraprocedure Event deleted, Intraprocedure Event edited, Intraprocedure Flowsheets edited, Intraprocedure Meds edited, Optician, dispensing edited

## 2017-09-29 NOTE — Brief Op Note (Signed)
09/29/2017  11:39 AM  PATIENT:  Anna [name]  22 y.o. female   PRE-OPERATIVE DIAGNOSIS:Pregnancy 37w 3 days, failed IOL due to Preeclampsia. Morbid obesity with Body mass index is 74.39 kg/m.   failure to progress  POST-OPERATIVE DIAGNOSIS:  failure to progress,same as above , delivered,   PROCEDURE:  Procedure(s): CESAREAN SECTION (N/A) Primary low transverse cesarean SURGEON:  Surgeon(s) and Role:    Tilda Burrow, MD - Primary  PHYSICIAN ASSISTANT: Scheryl Darter MD  ASSISTANTS: none   ANESTHESIA:   epidural  EBL:  405 mL   BLOOD ADMINISTERED:none  DRAINS: Urinary Catheter (Foley)   LOCAL MEDICATIONS USED:  NONE  SPECIMEN:  Source of Specimen:  placenta to L&D  DISPOSITION OF SPECIMEN:  N/A  COUNTS:  YES  TOURNIQUET:  * No tourniquets in log *  DICTATION: .Dragon Dictation  PLAN OF CARE: has admit orders.  PATIENT DISPOSITION:  PACU - hemodynamically stable.   Delay start of Pharmacological VTE agent (>24hrs) due to surgical blood loss or risk of bleeding: not applicable

## 2017-09-29 NOTE — Addendum Note (Signed)
Addendum  created 09/29/17 1532 by Shanon Payor, CRNA   Intraprocedure Meds edited

## 2017-09-29 NOTE — Addendum Note (Signed)
Addendum  created 09/29/17 1734 by Trellis Paganini, CRNA   Sign clinical note

## 2017-09-29 NOTE — Anesthesia Postprocedure Evaluation (Signed)
Anesthesia Post Note  Patient: Anna [name]  Procedure(s) Performed: CESAREAN SECTION (N/A Abdomen)     Patient location during evaluation: Mother Baby Anesthesia Type: Epidural Level of consciousness: awake and alert Pain management: pain level controlled Vital Signs Assessment: post-procedure vital signs reviewed and stable Respiratory status: spontaneous breathing, nonlabored ventilation and respiratory function stable Cardiovascular status: stable Postop Assessment: no headache, no backache and epidural receding Anesthetic complications: no    Last Vitals:  Vitals:   09/29/17 1145 09/29/17 1200  BP: 107/62 116/69  Pulse:  71  Resp: 16 (!) 22  Temp:    SpO2:      Last Pain:  Vitals:   09/29/17 1200  TempSrc:   PainSc: 0-No pain   Pain Goal: Patients Stated Pain Goal: 3 (09/27/17 2358)               Trevor Iha

## 2017-09-29 NOTE — Anesthesia Postprocedure Evaluation (Signed)
Anesthesia Post Note  Patient: Anna [name]  Procedure(s) Performed: CESAREAN SECTION (N/A Abdomen)     Patient location during evaluation: Mother Baby Anesthesia Type: Epidural Level of consciousness: awake and alert Pain management: pain level controlled Vital Signs Assessment: post-procedure vital signs reviewed and stable Respiratory status: spontaneous breathing, nonlabored ventilation and respiratory function stable Cardiovascular status: stable Postop Assessment: no headache, no backache and epidural receding Anesthetic complications: no    Last Vitals:  Vitals:   09/29/17 1413 09/29/17 1523  BP: 140/89 136/86  Pulse: 78 69  Resp: 20 18  Temp: 36.7 C 36.7 C  SpO2: 97% 96%    Last Pain:  Vitals:   09/29/17 1524  TempSrc:   PainSc: 0-No pain   Pain Goal: Patients Stated Pain Goal: 3 (09/27/17 2358)               Emmaline Kluver N

## 2017-09-30 LAB — CBC
HCT: 26.5 % — ABNORMAL LOW (ref 36.0–46.0)
Hemoglobin: 8.5 g/dL — ABNORMAL LOW (ref 12.0–15.0)
MCH: 24.9 pg — AB (ref 26.0–34.0)
MCHC: 32.1 g/dL (ref 30.0–36.0)
MCV: 77.7 fL — ABNORMAL LOW (ref 78.0–100.0)
PLATELETS: 230 10*3/uL (ref 150–400)
RBC: 3.41 MIL/uL — AB (ref 3.87–5.11)
RDW: 14.5 % (ref 11.5–15.5)
WBC: 9.8 10*3/uL (ref 4.0–10.5)

## 2017-09-30 LAB — CREATININE, SERUM
Creatinine, Ser: 0.68 mg/dL (ref 0.44–1.00)
GFR calc Af Amer: >60 mL/min (ref >60)
GFR calc non Af Amer: >60 mL/min (ref >60)

## 2017-09-30 MED ORDER — GI COCKTAIL ~~LOC~~
30.0000 mL | Freq: Once | ORAL | Status: AC
  Administered 2017-09-30: 30 mL via ORAL
  Filled 2017-09-30: qty 30

## 2017-09-30 NOTE — Progress Notes (Signed)
Patient ID: Anna [name], female   DOB: 21-Apr-1995, 22 y.o.   MRN: [id number] RN called to report patient complaining of sudden chest pain Vitals:   09/29/17 1523 09/29/17 1823 09/29/17 2215 09/30/17 0140  BP: 136/86 138/86 125/71 (!) 138/91  Pulse: 69 74 74 74  Resp: 18 18 18 20   Temp: 98 F (36.7 C) 98 F (36.7 C) 99 F (37.2 C) 97.9 F (36.6 C)  TempSrc: Oral Axillary Axillary Oral  SpO2: 96% 97% 97% 98%  Weight:      Height:       Pulse ox 100%on RA HR 71.  Will try GI cocktail first, and if no relief will get EKG

## 2017-09-30 NOTE — Lactation Note (Signed)
This note was copied from a baby's chart. Lactation Consultation Note  Patient Name: Anna [name] Today's Date: 09/30/2017 Reason for consult: Initial assessment;Early term 37-38.6wks;1st time breastfeeding P1, 14 hr female infant, c/s delivery. Per mom, given infant formula prior LC entering room, excessive intake 2 ounces. LC discussed w/ mom feeding guidelines based on infant's hour/ age. Per mom, she wants to breastfeed her baby. LC assisted mom in hand expression. Mom hand expressed 8 ml of breast milk. Mom encouraged to feed baby 8-12 times/24 hours and with feeding cues.  Mom plans to latch infant to breast at next feeding then give back EBM according feeding guidelines for ETI infant. LC discussed infant behaviors and feeding guidelines for ETI infant. Mom will not exceed 3 hours without feeding infant. Mom plans to BF, then give infant  back as much as  EBM  As possible or formula  If supplementation is needed based on infant's age/ hours. Mom knows to pump q3h for 15-20 min. LC discussed I&O. Reviewed Baby & Me book's Breastfeeding Basics.  Mom made aware of O/P services, breastfeeding support groups, community resources, and our phone # for post-discharge questions.   Maternal Data Formula Feeding for Exclusion: No Has patient been taught Hand Expression?: Yes(LC assisted mom in hand expression, Mom expressed 8-9 ml of BM.) Does the patient have breastfeeding experience prior to this delivery?: No  Feeding Feeding Type: Breast Fed  LATCH Score                   Interventions Interventions: Breast feeding basics reviewed;Expressed milk;Hand express;DEBP  Lactation Tools Discussed/Used WIC Program: Yes Pump Review: Setup, frequency, and cleaning;Milk Storage;Other (comment) Initiated by:: Danelle Earthly, IBCLC Date initiated:: 09/30/17   Consult Status Consult Status: Follow-up Date: 09/30/17    Danelle Earthly 09/30/2017, 12:39 AM

## 2017-09-30 NOTE — Progress Notes (Signed)
Subjective: Postpartum Day 1: Cesarean Delivery Patient reports tolerating PO and + flatus.  Still waiting to void. Up and around with no problems.  Objective: Vital signs in last 24 hours: Temp:  [97.9 F (36.6 C)-99.1 F (37.3 C)] 98.3 F (36.8 C) (09/02 5277) Pulse Rate:  [65-78] 65 (09/02 0633) Resp:  [16-22] 20 (09/02 8242) BP: (107-145)/(62-91) 121/81 (09/02 3536) SpO2:  [96 %-100 %] 100 % (09/02 1443)  Physical Exam:  General: alert, cooperative and no distress Lochia: appropriate Uterine Fundus: firm Incision: healing well, no significant drainage DVT Evaluation: No evidence of DVT seen on physical exam.  Recent Labs    09/28/17 1628 09/30/17 0743  HGB 10.0* 8.5*  HCT 31.5* 26.5*    Assessment/Plan: Status post Cesarean section. Doing well postoperatively.  Continue current care.  Wynelle Bourgeois 09/30/2017, 9:47 AM

## 2017-09-30 NOTE — Lactation Note (Signed)
This note was copied from a baby's chart. Lactation Consultation Note  Patient Name: Girl Rickea Schutz [id number] Date: 09/30/2017 Reason for consult: Follow-up assessment   Baby 24 hours old and called for latch assistance. Mother has large breasts and baby has been sleepy.  Early this morning received 2 oz of formula. Reviewed hand expression and mother has a great supply of colostrum. Hand expressed 5 ml easily then assisted w/ latching baby in football hold. Placed towel under breast lift. Baby breastfed for approx 10 min. Assisted w/ DEBP and finger syringe fed 5 ml of colostrum. Mom has my # to call for assist w/next feeding.    Maternal Data    Feeding Feeding Type: Breast Fed Length of feed: 10 min  LATCH Score Latch: Repeated attempts needed to sustain latch, nipple held in mouth throughout feeding, stimulation needed to elicit sucking reflex.  Audible Swallowing: A few with stimulation  Type of Nipple: Everted at rest and after stimulation  Comfort (Breast/Nipple): Soft / non-tender  Hold (Positioning): Assistance needed to correctly position infant at breast and maintain latch.  LATCH Score: 7  Interventions Interventions: Breast feeding basics reviewed;Assisted with latch;Skin to skin;Breast massage;Hand express;Breast compression;Support pillows;Adjust position;Hand pump;DEBP  Lactation Tools Discussed/Used     Consult Status Consult Status: Follow-up Date: 10/01/17 Follow-up type: In-patient    Dahlia Byes [facility] 09/30/2017, 10:57 AM

## 2017-09-30 NOTE — Progress Notes (Signed)
CSW received consult for hx of marijuana use.  Referral was screened out due to the following: °~MOB had no documented substance use after initial prenatal visit/+UPT. °~MOB had no positive drug screens after initial prenatal visit/+UPT. °~Baby's UDS is negative. ° °Please consult CSW if current concerns arise or by MOB's request. ° °CSW will monitor CDS results and make report to Child Protective Services if warranted. ° °Cord Wilczynski Boyd-Gilyard, MSW, LCSW °Clinical Social Work °[phone] ° °

## 2017-09-30 NOTE — Progress Notes (Signed)
Pt called out and stated "my chest is hurting" On further questioning, pt stated she was sleeping and woke up all of a sudden with chest pain. LS clear, pulse 71 with 100 on RA. Called Wynelle Bourgeois, order given.

## 2017-10-01 ENCOUNTER — Encounter (HOSPITAL_COMMUNITY): Payer: Self-pay | Admitting: *Deleted

## 2017-10-01 MED ORDER — AMLODIPINE BESYLATE 5 MG PO TABS
5.0000 mg | ORAL_TABLET | Freq: Every day | ORAL | Status: DC
  Administered 2017-10-01: 5 mg via ORAL
  Filled 2017-10-01 (×2): qty 1

## 2017-10-01 MED ORDER — SENNOSIDES-DOCUSATE SODIUM 8.6-50 MG PO TABS: 2.0000 | ORAL_TABLET | ORAL | 0 refills | Status: DC

## 2017-10-01 MED ORDER — OXYCODONE HCL 5 MG PO TABS: 5.0000 mg | ORAL_TABLET | ORAL | 0 refills | Status: DC | PRN

## 2017-10-01 MED ORDER — IBUPROFEN 600 MG PO TABS: 600.0000 mg | ORAL_TABLET | Freq: Four times a day (QID) | ORAL | 0 refills | Status: DC

## 2017-10-01 MED ORDER — AMLODIPINE BESYLATE 5 MG PO TABS: 5.0000 mg | ORAL_TABLET | Freq: Every day | ORAL | 1 refills | Status: DC

## 2017-10-01 NOTE — Discharge Summary (Signed)
OB Discharge Summary     Patient Name: Anna [name] DOB: [birth year] MRN: [id number]  Date of admission: 09/26/2017 Delivering MD: Tilda Burrow   Date of discharge: 10/01/2017  Admitting diagnosis: INDUCTION Intrauterine pregnancy: [redacted]w[redacted]d     Secondary diagnosis:  Active Problems:   Gestational hypertension  Additional problems: Mild Polyhydramnios      Discharge diagnosis: Term Pregnancy Delivered and Gestational Hypertension                                                                                                Post partum procedures:none  Augmentation: AROM, Pitocin, Cytotec and Foley Balloon  Complications: None  Hospital course:  Induction of Labor With Cesarean Section  22 y.o. yo G1P0 at [redacted]w[redacted]d was admitted to the hospital 09/26/2017 for induction of labor for gHTN. The patient went for cesarean section due to Arrest of Dilation, and delivered a Viable infant,09/29/2017  Membrane Rupture Time/Date: 4:18 AM ,09/29/2017   Details of operation can be found in separate operative Note.  Patient had an uncomplicated postpartum course. She is ambulating, tolerating a regular diet, passing flatus, and urinating well.  Patient is discharged home in stable condition on 10/01/17.                                    Physical exam  Vitals:   09/30/17 1447 09/30/17 2319 10/01/17 0540 10/01/17 1134  BP: 131/77 (!) 155/95 (!) 155/80 (!) 150/97  Pulse: 77 82 84   Resp: 19  18   Temp: 98.2 F (36.8 C) 98.3 F (36.8 C) 98 F (36.7 C)   TempSrc: Oral Oral Oral   SpO2: 100%  99%   Weight:      Height:       General: alert and cooperative Lochia: appropriate Uterine Fundus: firm Incision: Dressing is clean, dry, and intact, wound vac in place  DVT Evaluation: No evidence of DVT seen on physical exam. Labs: Lab Results  Component Value Date   WBC 9.8 09/30/2017   HGB 8.5 (L) 09/30/2017   HCT 26.5 (L) 09/30/2017   MCV 77.7 (L) 09/30/2017   PLT 230 09/30/2017   CMP  Latest Ref Rng & Units 09/30/2017  Glucose 70 - 99 mg/dL -  BUN 6 - 20 mg/dL -  Creatinine 4.66 - 5.99 mg/dL 3.57  Sodium 017 - 793 mmol/L -  Potassium 3.5 - 5.1 mmol/L -  Chloride 98 - 111 mmol/L -  CO2 22 - 32 mmol/L -  Calcium 8.9 - 10.3 mg/dL -  Total Protein 6.5 - 8.1 g/dL -  Total Bilirubin 0.3 - 1.2 mg/dL -  Alkaline Phos 38 - 903 U/L -  AST 15 - 41 U/L -  ALT 0 - 44 U/L -    Discharge instruction: per After Visit Summary and "Baby and Me Booklet".  After visit meds:  Allergies as of 10/01/2017      Reactions   Diflucan [fluconazole] Rash      Medication List    TAKE  these medications   amLODipine 5 MG tablet Commonly known as:  NORVASC Take 1 tablet (5 mg total) by mouth daily. Start taking on:  10/02/2017   butalbital-acetaminophen-caffeine 50-325-40 MG tablet Commonly known as:  FIORICET, ESGIC Take 1 tablet by mouth every 6 (six) hours as needed for headache.   cetirizine 5 MG tablet Commonly known as:  ZYRTEC Take 1 tablet (5 mg total) by mouth daily. What changed:    when to take this  reasons to take this   ferrous sulfate 325 (65 FE) MG tablet Take 325 mg by mouth 2 (two) times daily with a meal.   ibuprofen 600 MG tablet Commonly known as:  ADVIL,MOTRIN Take 1 tablet (600 mg total) by mouth every 6 (six) hours.   oxyCODONE 5 MG immediate release tablet Commonly known as:  Oxy IR/ROXICODONE Take 1 tablet (5 mg total) by mouth every 4 (four) hours as needed for severe pain.   pantoprazole 20 MG tablet Commonly known as:  PROTONIX Take 1 tablet (20 mg total) by mouth daily.   PNV PRENATAL PLUS MULTIVITAMIN 27-1 MG Tabs Take 1 tablet by mouth daily.   Prenatal Adult Gummy/DHA/FA 0.4-25 MG Chew Chew 1 tablet by mouth daily.   senna-docusate 8.6-50 MG tablet Commonly known as:  Senokot-S Take 2 tablets by mouth daily. Start taking on:  10/02/2017       Diet: routine diet  Activity: Advance as tolerated. Pelvic rest for 6 weeks.    Outpatient follow up: 1 week for post-op check and wound vac removal  Follow up Appt: Future Appointments  Date Time Provider Department Center  10/07/2017  1:15 PM Tilda Burrow, MD [facility]   Follow up Visit:No follow-ups on file.  Postpartum contraception: Undecided  Newborn Data: Live born female  Birth Weight: 7 lb 10.1 oz (3460 g) APGAR: 7, 8  Newborn Delivery   Birth date/time:  09/29/2017 10:27:00 Delivery type:  C-Section, Low Transverse Trial of labor:  Yes C-section categorization:  Primary     Baby Feeding: Bottle and Breast Disposition:home with mother   Pregnancy complicated by gHTN. Elevated BPs noted after delivery. Patient started on Norvasc 5 mg daily. Placed order for baby love BP check. Discussed need of close monitoring of BP and symptoms warranting MAU follow up.    10/01/2017 De Hollingshead, DO

## 2017-10-01 NOTE — Addendum Note (Signed)
Addendum  created 10/01/17 1956 by Trevor Iha, MD   Intraprocedure Staff edited

## 2017-10-01 NOTE — Discharge Instructions (Signed)

## 2017-10-01 NOTE — Lactation Note (Signed)
This note was copied from a baby's chart. Lactation Consultation Note  Patient Name: Anna [name] GHWEX'H Date: 10/01/2017 Reason for consult: Follow-up assessment;Early term 37-38.6wks P1, 40 hours, ETI, C/s delivery. LC entered room mom was ready to fed infant. Mom sat on the edge of the bed and feed infant on her  right breast using the  foot ball hold position. Mom has very large breast, infant latched with a wide gape and swallows heard by LC,  mom feed infant for  -15 minutes. Mom has been giving by EBM after feedings. Infant had emesis after feeding. Mom burped baby and did STS after feeding.  Mom will call LC if she has any  Further questions or concerns.   Maternal Data    Feeding Feeding Type: Bottle Fed - Formula Nipple Type: Slow - flow  LATCH Score Latch: Grasps breast easily, tongue down, lips flanged, rhythmical sucking.  Audible Swallowing: Spontaneous and intermittent  Type of Nipple: Everted at rest and after stimulation  Comfort (Breast/Nipple): Soft / non-tender  Hold (Positioning): Assistance needed to correctly position infant at breast and maintain latch.  LATCH Score: 9  Interventions Interventions: Support pillows;Position options  Lactation Tools Discussed/Used     Consult Status Consult Status: Follow-up Date: 10/01/17 Follow-up type: In-patient    Anna [name] 10/01/2017, 2:30 AM

## 2017-10-03 ENCOUNTER — Inpatient Hospital Stay (HOSPITAL_COMMUNITY): Payer: Federal, State, Local not specified - PPO

## 2017-10-03 ENCOUNTER — Other Ambulatory Visit: Payer: Self-pay

## 2017-10-03 ENCOUNTER — Encounter (HOSPITAL_COMMUNITY): Payer: Self-pay | Admitting: *Deleted

## 2017-10-03 ENCOUNTER — Encounter: Payer: Self-pay | Admitting: Obstetrics and Gynecology

## 2017-10-03 ENCOUNTER — Ambulatory Visit (INDEPENDENT_AMBULATORY_CARE_PROVIDER_SITE_OTHER): Payer: Federal, State, Local not specified - PPO | Admitting: Obstetrics and Gynecology

## 2017-10-03 ENCOUNTER — Other Ambulatory Visit: Payer: Self-pay | Admitting: Obstetrics and Gynecology

## 2017-10-03 ENCOUNTER — Inpatient Hospital Stay (HOSPITAL_COMMUNITY)
Admission: AD | Admit: 2017-10-03 | Discharge: 2017-10-05 | Disposition: A | Discharge status: Home / Self Care | Payer: Federal, State, Local not specified - PPO | Source: Ambulatory Visit | Attending: Obstetrics and Gynecology | Admitting: Obstetrics and Gynecology

## 2017-10-03 VITALS — BP 180/100 | HR 98 | Ht 60.0 in | Wt >= 6400 oz

## 2017-10-03 DIAGNOSIS — O1415 Severe pre-eclampsia, complicating the puerperium: Secondary | ICD-10-CM

## 2017-10-03 DIAGNOSIS — R062 Wheezing: Secondary | ICD-10-CM

## 2017-10-03 DIAGNOSIS — O1495 Unspecified pre-eclampsia, complicating the puerperium: Secondary | ICD-10-CM

## 2017-10-03 DIAGNOSIS — T8149XA Infection following a procedure, other surgical site, initial encounter: Secondary | ICD-10-CM

## 2017-10-03 DIAGNOSIS — D649 Anemia, unspecified: Secondary | ICD-10-CM

## 2017-10-03 LAB — URINALYSIS, ROUTINE W REFLEX MICROSCOPIC
Bacteria, UA: NONE SEEN
Bilirubin Urine: NEGATIVE
GLUCOSE, UA: NEGATIVE mg/dL
Ketones, ur: NEGATIVE mg/dL
NITRITE: NEGATIVE
PROTEIN: NEGATIVE mg/dL
Specific Gravity, Urine: 1.004 — ABNORMAL LOW (ref 1.005–1.030)
pH: 7 (ref 5.0–8.0)

## 2017-10-03 MED ORDER — MAGNESIUM SULFATE 40 G IN LACTATED RINGERS - SIMPLE
2.0000 g/h | INTRAVENOUS | Status: AC
  Administered 2017-10-03 – 2017-10-04 (×2): 2 g/h via INTRAVENOUS
  Filled 2017-10-03 (×2): qty 500

## 2017-10-03 MED ORDER — FUROSEMIDE 40 MG PO TABS: 40.0000 mg | ORAL_TABLET | ORAL | Status: DC

## 2017-10-03 MED ORDER — HYDRALAZINE HCL 20 MG/ML IJ SOLN
5.0000 mg | INTRAMUSCULAR | Status: DC | PRN
  Administered 2017-10-04 (×2): 5 mg via INTRAVENOUS
  Filled 2017-10-03 (×3): qty 1

## 2017-10-03 MED ORDER — IBUPROFEN 600 MG PO TABS
600.0000 mg | ORAL_TABLET | Freq: Four times a day (QID) | ORAL | Status: DC | PRN
  Administered 2017-10-04 (×3): 600 mg via ORAL
  Filled 2017-10-03 (×3): qty 1

## 2017-10-03 MED ORDER — AMLODIPINE BESYLATE 10 MG PO TABS
10.0000 mg | ORAL_TABLET | Freq: Every day | ORAL | Status: DC
  Administered 2017-10-04 – 2017-10-05 (×2): 10 mg via ORAL
  Filled 2017-10-03 (×2): qty 1

## 2017-10-03 MED ORDER — PNV PRENATAL PLUS MULTIVITAMIN 27-1 MG PO TABS: 1.0000 | ORAL_TABLET | Freq: Every day | ORAL | Status: DC

## 2017-10-03 MED ORDER — OXYCODONE-ACETAMINOPHEN 5-325 MG PO TABS
1.0000 | ORAL_TABLET | ORAL | Status: DC | PRN
  Administered 2017-10-04 – 2017-10-05 (×2): 2 via ORAL
  Filled 2017-10-03 (×3): qty 2

## 2017-10-03 MED ORDER — PRENATAL MULTIVITAMIN CH
1.0000 | ORAL_TABLET | Freq: Every day | ORAL | Status: DC
  Administered 2017-10-04 – 2017-10-05 (×2): 1 via ORAL
  Filled 2017-10-03 (×2): qty 1

## 2017-10-03 MED ORDER — LABETALOL HCL 5 MG/ML IV SOLN: 40.0000 mg | INTRAVENOUS | Status: DC | PRN

## 2017-10-03 MED ORDER — NYSTATIN 100000 UNIT/GM EX POWD
Freq: Three times a day (TID) | CUTANEOUS | Status: DC
  Administered 2017-10-03 – 2017-10-04 (×3): via TOPICAL
  Filled 2017-10-03: qty 15

## 2017-10-03 MED ORDER — HYDRALAZINE HCL 20 MG/ML IJ SOLN
10.0000 mg | INTRAMUSCULAR | Status: DC | PRN
  Administered 2017-10-04 (×2): 10 mg via INTRAVENOUS
  Filled 2017-10-03: qty 1

## 2017-10-03 MED ORDER — HYDRALAZINE HCL 20 MG/ML IJ SOLN: 10.0000 mg | Freq: Once | INTRAMUSCULAR | Status: DC

## 2017-10-03 MED ORDER — FUROSEMIDE 80 MG PO TABS
80.0000 mg | ORAL_TABLET | ORAL | Status: AC
  Administered 2017-10-03: 80 mg via ORAL
  Filled 2017-10-03: qty 1

## 2017-10-03 MED ORDER — MAGNESIUM SULFATE 50 % IJ SOLN
5.0000 g | INTRAMUSCULAR | Status: DC
  Filled 2017-10-03 (×5): qty 10

## 2017-10-03 MED ORDER — ENOXAPARIN SODIUM 40 MG/0.4ML ~~LOC~~ SOLN: 40.0000 mg | SUBCUTANEOUS | Status: DC

## 2017-10-03 MED ORDER — SODIUM CHLORIDE 0.9 % IV SOLN
INTRAVENOUS | Status: DC
  Administered 2017-10-03: 23:00:00 via INTRAVENOUS
  Filled 2017-10-03: qty 1000

## 2017-10-03 MED ORDER — HYDRALAZINE HCL 25 MG PO TABS
25.0000 mg | ORAL_TABLET | ORAL | Status: AC
  Administered 2017-10-03: 25 mg via ORAL
  Filled 2017-10-03: qty 1

## 2017-10-03 MED ORDER — MAGNESIUM SULFATE 50 % IJ SOLN
10.0000 g | Freq: Once | INTRAMUSCULAR | Status: DC
  Filled 2017-10-03: qty 20

## 2017-10-03 MED ORDER — SULFAMETHOXAZOLE-TRIMETHOPRIM 800-160 MG PO TABS
1.0000 | ORAL_TABLET | Freq: Two times a day (BID) | ORAL | Status: DC
  Administered 2017-10-03 – 2017-10-04 (×2): 1 via ORAL
  Filled 2017-10-03 (×4): qty 1

## 2017-10-03 MED ORDER — ENOXAPARIN SODIUM 100 MG/ML ~~LOC~~ SOLN
100.0000 mg | SUBCUTANEOUS | Status: DC
  Administered 2017-10-03 – 2017-10-04 (×2): 100 mg via SUBCUTANEOUS
  Filled 2017-10-03 (×3): qty 1

## 2017-10-03 MED ORDER — FUROSEMIDE 10 MG/ML IJ SOLN
20.0000 mg | Freq: Four times a day (QID) | INTRAMUSCULAR | Status: DC
  Administered 2017-10-03 – 2017-10-04 (×2): 20 mg via INTRAVENOUS
  Filled 2017-10-03 (×3): qty 2

## 2017-10-03 MED ORDER — LABETALOL HCL 5 MG/ML IV SOLN: 20.0000 mg | INTRAVENOUS | Status: DC | PRN

## 2017-10-03 MED ORDER — FERROUS SULFATE 325 (65 FE) MG PO TABS
325.0000 mg | ORAL_TABLET | Freq: Two times a day (BID) | ORAL | Status: DC
  Administered 2017-10-04 – 2017-10-05 (×3): 325 mg via ORAL
  Filled 2017-10-03 (×3): qty 1

## 2017-10-03 MED ORDER — MAGNESIUM SULFATE BOLUS VIA INFUSION
4.0000 g | Freq: Once | INTRAVENOUS | Status: AC
  Administered 2017-10-03: 4 g via INTRAVENOUS
  Filled 2017-10-03: qty 500

## 2017-10-03 NOTE — Progress Notes (Signed)
Patient ID: Anna [name], female   DOB: [birth year], 22 y.o.   MRN: [id number]  Subjective:  Anna [name] is a 22 y.o. female now 4 days status post  C section, here for vac removal. She reports a foul smell coming from the incision. She has gained 14 lbs since her last visit (before she delivered) but claims that she has completely positively changed her diet. She says she is holding onto a lot of fluid and woke up this morning wheezing. She is breast and bottle feeding but bottle feeds more often. Her BPs today were very high (182/100).  Review of Systems Negative except for a foul smell coming from the incision and wheezing   Diet:   She has improved her diet   Bowel movements : normal.  The patient is not having any pain.  Objective:  There were no vitals taken for this visit. General:Well developed, well nourished.  No acute distress. Abdomen: Bowel sounds normal, soft, non-tender. Lung/Heart sounds normal  No rales or rhonchi. Breath sounds diminished by pt obesity/habitus.  Pelvic Exam: DEFERRED  Incision(s): healing very well but with a foul smell /vacuum dressing saturated and removed.   Assessment:  Post-Op 4 days s/p C section   Doing well postoperatively. Postpartum hypertension, preeclampsia with severe features based on bp criteria.  Body mass index is 82.1 kg/m.    Plan:  1.Wound care discussed 2 Admit today for Mag sulfate, lasix 20 iv q 6 x 3, Norvasc 10 daily plus Apresoline protocol, daily weights.  [facility]. [facility]. IV Magnesium sulfate, Lasix  By signing Anna name below, I, Pietro Cassis, attest that this documentation has been prepared under the direction and in the presence of Tilda Burrow, MD. Electronically Signed: Pietro Cassis, Medical Scribe. 10/03/17. 2:22 PM.  I personally performed the services described in this documentation, which was SCRIBED in Anna presence. The recorded information has been  reviewed and considered accurate. It has been edited as necessary during review. Tilda Burrow, MD

## 2017-10-03 NOTE — H&P (Addendum)
Postpartum Admission History and Physical  KERIGAN NARVAEZ is a 22 y.o. G1P1 POD#4 s/p LTCS on 09/29/2017 for failure to progress after failed IOL for GHTN at 37 weeks, who presented today to [facility] office with signs and symptoms concerning for postpartum severe preeclampsia (PEC).  She was discharged to home on POD#2, did not have PEC while admitted.  She presented to [facility] today for wound check, noted to have copious foul-smelling incisional drainage and dressing was removed. Patient also reported having wheezing since this morning and she noticed that she had gained a lot of water weight "I am holding onto a lot of fluid". She was noted to have a 14 # weight gain since her last visit in the office; from 406 lb to 420 lb.   BP was also noted to be as high as 180s/100s, had diminished breath sounds on exam.  The plan was made for her to be re-admitted for postpartum preeclampsia management and aggressive diuresis.    On arrival at [facility], patient reported left sided chest pain that she noticed after she left the office today. This makes it hard for her to take deep breaths.  Still having wheezing sensation, like she cannot breathe deeply. No headaches, visual symptoms, RUQ/epigastric pain. No fevers, N/V, other neurologic symptoms or other systemic symptoms.  Past Medical History:  Diagnosis Date  . Allergy   . GERD (gastroesophageal reflux disease)   . Hypertension   . Morbid obesity with BMI of 70 and over, adult (HCC) 09/29/2017   with hypertension    Past Surgical History:  Procedure Laterality Date  . CESAREAN SECTION N/A 09/29/2017   Procedure: CESAREAN SECTION;  Surgeon: Tilda Burrow, MD;  Location: [facility] BIRTHING SUIT[facility];  Service: Obstetrics;  Laterality: N/A;  . MYRINGOTOMY    . TONSILLECTOMY AND ADENOIDECTOMY     OB History  Gravida Para Term Preterm AB Living  1         1  SAB TAB Ectopic Multiple Live Births               # Outcome Date GA Lbr Len/2nd Weight Sex Delivery Anes  PTL Lv  1 Gravida           Patient denies any other pertinent gynecologic issues.   No current facility-administered medications on file prior to encounter.    Current Outpatient Medications on File Prior to Encounter  Medication Sig Dispense Refill  . amLODipine (NORVASC) 5 MG tablet Take 1 tablet (5 mg total) by mouth daily. 30 tablet 1  . butalbital-acetaminophen-caffeine (FIORICET, ESGIC) 50-325-40 MG tablet Take 1 tablet by mouth every 6 (six) hours as needed for headache. (Patient not taking: Reported on 09/27/2017) 20 tablet 0  . cetirizine (ZYRTEC) 5 MG tablet Take 1 tablet (5 mg total) by mouth daily. (Patient taking differently: Take 5 mg by mouth daily as needed for allergies. ) 30 tablet 3  . ferrous sulfate 325 (65 FE) MG tablet Take 325 mg by mouth 2 (two) times daily with a meal.    . ibuprofen (ADVIL,MOTRIN) 600 MG tablet Take 1 tablet (600 mg total) by mouth every 6 (six) hours. 30 tablet 0  . oxyCODONE (OXY IR/ROXICODONE) 5 MG immediate release tablet Take 1 tablet (5 mg total) by mouth every 4 (four) hours as needed for severe pain. 30 tablet 0  . pantoprazole (PROTONIX) 20 MG tablet Take 1 tablet (20 mg total) by mouth daily. 30 tablet 6  . Prenatal MV & Min  w/FA-DHA (PRENATAL ADULT GUMMY/DHA/FA) 0.4-25 MG CHEW Chew 1 tablet by mouth daily. (Patient not taking: Reported on 09/27/2017) 30 tablet 11  . Prenatal Vit-Fe Fumarate-FA (PNV PRENATAL PLUS MULTIVITAMIN) 27-1 MG TABS Take 1 tablet by mouth daily. 30 tablet 11  . senna-docusate (SENOKOT-S) 8.6-50 MG tablet Take 2 tablets by mouth daily. 30 tablet 0   Allergies  Allergen Reactions  . Diflucan [Fluconazole] Rash    Social History:   reports that she has never smoked. She has never used smokeless tobacco. She reports that she does not drink alcohol or use drugs.  Family History  Problem Relation Age of Onset  . Asthma Mother   . Anemia Mother   . Hypertension Father   . Cancer Maternal Grandfather   . Hypertension  Paternal Grandmother   . Heart failure Paternal Grandmother     Review of Systems: Pertinent items noted in HPI and remainder of comprehensive ROS otherwise negative.  PHYSICAL EXAM: Blood pressure (!) 162/96, pulse 93, temperature 99.1 F (37.3 C), temperature source Oral, resp. rate 20, SpO2 99 %, currently breastfeeding. CONSTITUTIONAL: Well-developed, morbidly obese, female in no acute distress.  HENT:  Normocephalic, atraumatic, External right and left ear normal. Oropharynx is clear and moist EYES: Conjunctivae and EOM are normal. Pupils are equal, round, and reactive to light. No scleral icterus.  NECK: Normal range of motion, supple, no masses SKIN: Skin is warm and dry. No rash noted. Not diaphoretic. No erythema. No pallor. NEUROLOGIC: Alert and oriented to person, place, and time.  PSYCHIATRIC: Normal mood and affect. Normal behavior. Normal judgment and thought content. CARDIOVASCULAR: Normal heart rate noted, regular rhythm RESPIRATORY: Effort and breath sounds normal, no problems with respiration noted ABDOMEN: Soft, obese, nontender, nondistended.  Incisional erythema and small amount of foul-smelling drainage noted. No induration.  PELVIC: Deferred MUSCULOSKELETAL: Normal range of motion. 3+ BLE edema and no tenderness. 2+ distal pulses. 2+DTRs  Labs: CBC Latest Ref Rng & Units 09/30/2017 09/28/2017 09/26/2017  WBC 4.0 - 10.5 K/uL 9.8 7.1 8.1  Hemoglobin 12.0 - 15.0 g/dL 1.6(X) 10.0(L) 9.8(L)  Hematocrit 36.0 - 46.0 % 26.5(L) 31.5(L) 31.4(L)  Platelets 150 - 400 K/uL 230 276 301   CMP Latest Ref Rng & Units 09/30/2017 09/26/2017 09/23/2017  Glucose 70 - 99 mg/dL - 096(E) 454(U)  BUN 6 - 20 mg/dL - 10 8  Creatinine 9.81 - 1.00 mg/dL 1.91 4.78 2.95(A)  Sodium 135 - 145 mmol/L - 135 139  Potassium 3.5 - 5.1 mmol/L - 4.2 4.3  Chloride 98 - 111 mmol/L - 106 105  CO2 22 - 32 mmol/L - 18(L) 18(L)  Calcium 8.9 - 10.3 mg/dL - 8.9 8.9  Total Protein 6.5 - 8.1 g/dL - 6.6 6.1   Total Bilirubin 0.3 - 1.2 mg/dL - 0.4 0.3  Alkaline Phos 38 - 126 U/L - 73 74  AST 15 - 41 U/L - 19 15  ALT 0 - 44 U/L - 20 21    Imaging Studies: None recently   Assessment: Patient Active Problem List   Diagnosis Date Noted  . Postoperative wound cellulitis 10/03/2017  . Postoperative anemia 10/03/2017  . Pre-eclampsia, postpartum 10/03/2017  . Marijuana use 03/22/2017  . Supervision of high risk pregnancy, antepartum 03/21/2017  . Menorrhagia with regular cycle 12/04/2016  . Pre-diabetes 07/10/2010  . Morbid obesity with BMI of 70 and over, adult (HCC) 07/10/2010  . History of hypertension in pediatric patient 07/10/2010    Plan: Admit to [facility] OB unit - EKG  ordered given chest pain, also ordered Troponin - CXR STAT to evaluate for pulmonary edema - ECHO also ordered given increased risk for postpartum cardiomyopathy - Check CMET, CBC, BNP - Lasix 20 mg IV q6h - Magnesium sulfate IV 4g load/2g per hour for eclampsia prophylaxis - Hydralazine prn severe BP; continue maintenance Norvasc 10 mg daily - Daily weights, strict Is and Os - Regular diet - Lovenox for DVT/PE prophylaxis - Bactrim DS for presumed incisional cellulitis - Will treat anemia with Feraheme vs transfusion based on symptoms - Routine postpartum care   Jaynie Collins, MD, FACOG Obstetrician & Gynecologist, [facility] for [employer], [facility]

## 2017-10-03 NOTE — H&P (Deleted)
Patient has an admitting note done on 10/03/2017 by Jaynie Collins, MD.  Please refer to this H&P.

## 2017-10-03 NOTE — Progress Notes (Signed)
I was informed by patient's nurse that PICC line is not available after 7pm from [facility].  Patient still has no IV access, has received the oral doses of Lasix and Hydralazine. No neurological symptoms. Patient reports feeling better; and had 600 ml urine output but repeat BP about one hour after the Hydralazine is 180s/120. Consulted Pharmacy, will give additional 25 mg of Hydralazine. Consulted Dr. Richardson Landry (Anesthesiologist) about possibility of placing EJ/IJ line for access tonight. He will evaluate her soon. CXR did not show pulmonary edema, but showed cardiomegaly and pulmonary vascular congestion. EKG did not show any remarkable changes, chest pain has decreased since admission.  Will continue to hope to obtain IV access to administer IV medications, and to be able to check labs.  For now, will continue Hydralazine po prn severe BP (preferred to Nifedipine as per Cardiology consult) and Lasix po for diuresis. Given persistent elevated BP, will have to start magnesium sulfate IM for eclampsia prophylaxis (5 g of a 50 percent solution intramuscularly into each buttock (total of 10 g) followed by 5 g intramuscularly every four hours; may be mixed with 1 mL of xylocaine 2% solution to reduce pain).   Continue close observation.   Jaynie Collins, MD, FACOG Obstetrician & Gynecologist, [facility] for [employer], [facility]

## 2017-10-03 NOTE — Addendum Note (Signed)
Addended by: Jaynie Collins A on: 10/03/2017 06:26 PM   Modules accepted: Orders, SmartSet

## 2017-10-03 NOTE — Op Note (Signed)
09/29/2017  7:06 PM  PATIENT:  Anna [name]  22 y.o. female  PRE-OPERATIVE DIAGNOSIS:  failure to progress, morbid obesity, preeclampsia mild without severe features  POST-OPERATIVE DIAGNOSIS:  failure to progress, morbid obesity, preeclampsia mild without severe features  PROCEDURE:  Procedure(s): CESAREAN SECTION (N/A) primary low transverse cervical  SURGEON:  Surgeon(s) and Role:    * Tilda Burrow, MD - Primary  PHYSICIAN ASSISTANT: Scheryl Darter, MD  ASSISTANTS: none   ANESTHESIA:   epidural  EBL:  802 mL   BLOOD ADMINISTERED:none  DRAINS: Urinary Catheter (Foley), also postoperative wound vacuum placed on incision  LOCAL MEDICATIONS USED:  NONE  SPECIMEN:  No Specimen  DISPOSITION OF SPECIMEN:  Placenta to labor and delivery  COUNTS:  YES  TOURNIQUET:  * No tourniquets in log *  DICTATION: .Dragon Dictation  PLAN OF CARE: Has orders for inpatient admission  PATIENT DISPOSITION:  PACU - hemodynamically stable.   Delay start of Pharmacological VTE agent (>24hrs) due to surgical blood loss or risk of bleeding: not applicable Details of procedure: Patient was taken the operating room prepped and draped for lower abdominal surgery.  The huge pannus was taped up with the Traxi and 2 additional supplementary Traxi binders to try to hold the pannus up so that the lower abdomen could be accessed.  Abdomen was subsequently prepped and draped she received 4 g of Ancef intravenously as preoperative treatment.  Transverse lower abdominal incision was made above the natural skin fold about 2 cm with sharp dissection through a 15 cm long incision down to the fascia which was opened transversely.  We had some huge blood vessels that we encountered.  On the right side there was a arterial bleeder that required point cautery at the level of the rectus muscles.  The midline was entered the Alexis wound retractor positioned which helped tremendously, and then transverse lower  segment incision performed to identify the fetal vertex delivered the baby,, with the baby's cord clamped at 1 minute.  There was no malodor to the amniotic fluid.  The placenta delivered in response to Pitocin and crede uterine massage.  Membranes were apparently intact.  The uterus was swabbed out.  The uterus was closed in 2 layer fashion running locking 0 Monocryl followed by continuous running 0 Monocryl.  The abdomen was hemostatic.  The peritoneum was closed using running 2-0 Vicryl the fascia was closed with running 0 Vicryl, subcutaneous tissues were entered with rapid closure 2-0 Vicryl with good tissue edge approximation and then subcuticular 4-0 Vicryl closed the skin.  A wound vacuum device was then placed over the incision to assist with eliminating edema from the incision site.  Sponge and needle counts were correct patient to recovery room in satisfactory position condition

## 2017-10-03 NOTE — Progress Notes (Signed)
Talked to Dr.Skains (Cardiology) about this patient.  He agrees with our plan for aggressive diuresis.  Patient does not have IV access, she is undergoing attempt of this by Anesthesia team. He recommended Lasix 80 mg po now, and Hydralazine 25 mg po for her severe range BP.  He does not feel ECHO needs to be done tonight unless her condition deteriorates, will continue aggressive diuresis as planned. ECHO will be done in the morning.  Will also continue preeclampsia management.  Will follow up all ordered labs and studies and manage accordingly.  If IV access is not obtained soon, may need to call PICC team.   Jaynie Collins, MD, FACOG Obstetrician & Gynecologist, [facility] for [facility], [facility]

## 2017-10-04 ENCOUNTER — Inpatient Hospital Stay (HOSPITAL_COMMUNITY): Payer: Federal, State, Local not specified - PPO

## 2017-10-04 DIAGNOSIS — I10 Essential (primary) hypertension: Secondary | ICD-10-CM

## 2017-10-04 LAB — MAGNESIUM
MAGNESIUM: 4.2 mg/dL — AB (ref 1.7–2.4)
Magnesium: 2 mg/dL (ref 1.7–2.4)

## 2017-10-04 LAB — COMPREHENSIVE METABOLIC PANEL
ALT: 18 U/L (ref 0–44)
ALT: 19 U/L (ref 0–44)
ANION GAP: 12 (ref 5–15)
AST: 21 U/L (ref 15–41)
AST: 21 U/L (ref 15–41)
Albumin: 2.5 g/dL — ABNORMAL LOW (ref 3.5–5.0)
Albumin: 2.9 g/dL — ABNORMAL LOW (ref 3.5–5.0)
Alkaline Phosphatase: 63 U/L (ref 38–126)
Alkaline Phosphatase: 67 U/L (ref 38–126)
Anion gap: 11 (ref 5–15)
BUN: 10 mg/dL (ref 6–20)
BUN: 9 mg/dL (ref 6–20)
CALCIUM: 8.2 mg/dL — AB (ref 8.9–10.3)
CALCIUM: 8.2 mg/dL — AB (ref 8.9–10.3)
CHLORIDE: 101 mmol/L (ref 98–111)
CO2: 24 mmol/L (ref 22–32)
CO2: 25 mmol/L (ref 22–32)
CREATININE: 0.78 mg/dL (ref 0.44–1.00)
Chloride: 105 mmol/L (ref 98–111)
Creatinine, Ser: 0.84 mg/dL (ref 0.44–1.00)
GFR calc Af Amer: >60 mL/min (ref >60)
GFR calc non Af Amer: >60 mL/min (ref >60)
Glucose, Bld: 80 mg/dL (ref 70–99)
Glucose, Bld: 130 mg/dL — ABNORMAL HIGH (ref 70–99)
POTASSIUM: 2.9 mmol/L — AB (ref 3.5–5.1)
Potassium: 3.5 mmol/L (ref 3.5–5.1)
SODIUM: 138 mmol/L (ref 135–145)
Sodium: 140 mmol/L (ref 135–145)
TOTAL PROTEIN: 5.6 g/dL — AB (ref 6.5–8.1)
Total Bilirubin: 0.3 mg/dL (ref 0.3–1.2)
Total Bilirubin: 1.1 mg/dL (ref 0.3–1.2)
Total Protein: 7 g/dL (ref 6.5–8.1)

## 2017-10-04 LAB — CBC
HCT: 26.4 % — ABNORMAL LOW (ref 36.0–46.0)
HEMATOCRIT: 29.5 % — AB (ref 36.0–46.0)
Hemoglobin: 8.4 g/dL — ABNORMAL LOW (ref 12.0–15.0)
Hemoglobin: 9.4 g/dL — ABNORMAL LOW (ref 12.0–15.0)
MCH: 24.9 pg — AB (ref 26.0–34.0)
MCH: 24.2 pg — AB (ref 26.0–34.0)
MCHC: 31.8 g/dL (ref 30.0–36.0)
MCHC: 31.9 g/dL (ref 30.0–36.0)
MCV: 78.1 fL (ref 78.0–100.0)
MCV: 76 fL — AB (ref 78.0–100.0)
Platelets: 326 10*3/uL (ref 150–400)
Platelets: 356 10*3/uL (ref 150–400)
RBC: 3.38 MIL/uL — AB (ref 3.87–5.11)
RBC: 3.88 MIL/uL (ref 3.87–5.11)
RDW: 14.7 % (ref 11.5–15.5)
RDW: 14.5 % (ref 11.5–15.5)
WBC: 11.4 10*3/uL — AB (ref 4.0–10.5)
WBC: 12.2 10*3/uL — ABNORMAL HIGH (ref 4.0–10.5)

## 2017-10-04 LAB — ECHOCARDIOGRAM COMPLETE
Height: 60 in
WEIGHTICAEL: 6396 [oz_av]

## 2017-10-04 LAB — PREPARE RBC (CROSSMATCH)

## 2017-10-04 LAB — BRAIN NATRIURETIC PEPTIDE: B Natriuretic Peptide: 67.5 pg/mL (ref 0.0–100.0)

## 2017-10-04 LAB — TROPONIN I: Troponin I: <0.03 ng/mL (ref <0.03)

## 2017-10-04 MED ORDER — SULFAMETHOXAZOLE-TRIMETHOPRIM 800-160 MG PO TABS
2.0000 | ORAL_TABLET | Freq: Two times a day (BID) | ORAL | Status: DC
  Administered 2017-10-04: 2 via ORAL
  Filled 2017-10-04 (×2): qty 2

## 2017-10-04 MED ORDER — FUROSEMIDE 10 MG/ML IJ SOLN
20.0000 mg | Freq: Four times a day (QID) | INTRAMUSCULAR | Status: DC
  Filled 2017-10-04: qty 2

## 2017-10-04 MED ORDER — SODIUM CHLORIDE 0.9 % IV SOLN
510.0000 mg | Freq: Once | INTRAVENOUS | Status: AC
  Administered 2017-10-04: 510 mg via INTRAVENOUS
  Filled 2017-10-04: qty 17

## 2017-10-04 MED ORDER — FUROSEMIDE 10 MG/ML IJ SOLN
20.0000 mg | Freq: Four times a day (QID) | INTRAMUSCULAR | Status: AC
  Administered 2017-10-04 (×2): 20 mg via INTRAVENOUS
  Filled 2017-10-04 (×3): qty 2

## 2017-10-04 MED ORDER — POTASSIUM CHLORIDE CRYS ER 20 MEQ PO TBCR
40.0000 meq | EXTENDED_RELEASE_TABLET | Freq: Two times a day (BID) | ORAL | Status: DC
  Administered 2017-10-04 – 2017-10-05 (×3): 40 meq via ORAL
  Filled 2017-10-04 (×5): qty 2

## 2017-10-04 NOTE — Progress Notes (Signed)
Dr. Earlene Plater notified of BP of 170/102.

## 2017-10-04 NOTE — Lactation Note (Signed)
Lactation Consultation Note  Patient Name: CIARRA BRADDY IFXGX'I Date: 10/04/2017   Mom reports she was exclusively breastfeeding in the hospital.  Mom reports that once she got home she was given prescription meds that she was not sure was compatible with bf. Mom reports so she has not been breastfeeding at home.  Breasts are full and heavy and she asked about pumping.  RN brought her a pump/kit and everything she needed to pump. Mom concerned about the meds she is on now.  Reviewed meds with RN.  Meds are compatible with breastfeeding/pumping.  Let mom know that we were here if she needs [place].  Let her know that her RN could also set up breast pump for her. Will followed up as needed. Maternal Data    Feeding    LATCH Score                   Interventions    Lactation Tools Discussed/Used     Consult Status      Keylen Uzelac Thompson Caul 10/04/2017, 2:55 PM

## 2017-10-04 NOTE — Progress Notes (Signed)
Patient was able to have IV placed, is now receiving magnesium sulfate for eclampsia prophylaxis (started around 2300 yesterday) and continued antihypertensive regimen.  She is still receiving Lasix, output is >6000 ml of urine!  She has a hemoglobin of 8.4, normal platelets of 326K. Negative troponin.  CMET and BNP pending; may need K repletion given response to Lasix. Will continue to monitor closely.   Jaynie Collins, MD, FACOG Obstetrician & Gynecologist, [facility] for [employer], [facility]

## 2017-10-04 NOTE — Progress Notes (Signed)
Faculty Note  Patient feeling much better, states she is no longer having trouble breathing. Still with some incisional pain, concerned about smell. States she has been having significant drainage from wound and they have been keeping towels on it to try to keep it dry  BP (!) 158/100 (BP Location: Right Arm)   Pulse 100   Temp 98.6 F (37 C) (Oral)   Resp 18   Ht 5' (1.524 m)   Wt (!) 181.3 kg   SpO2 98%   BMI 78.07 kg/m   Gen: alert, oriented Abd: pfannenstiel incision appears intact with some areas slightly open, significant edema in pannus, area around wound with denuded skin, significant odor and appearance of drainage into pannus folds, incision itself without tenderness or erythema   prevena dressing placed onto wound after surrounding skin cleaned, good seal obtained  A/P: 22 yo G1P1 readmitted s/p LTCS for FTP on 09/29/17 after IOL for gHTN. Readmitted for severe shortness of breath and concern for pre-clampsia with severe features. Has diuresed > 15 L fluid with aggressive lasix regimen since arrival, SOB symptoms have improved significantly. On MgSO4. Echo completed for pulmonary vascular congestions and cardiomegaly on CXR, awaiting read.  Awaiting PICC line placement as all attempts to obtain labs/IV sticks have been unsuccessful. Prevena dressing placed onto incision to attempt to keep incision dry.   Cont MgSO4 PICC line Repeat labs prevena dressing in place Cont lasix today lovenox for PE/DVT ppx Cont bactrim for ppx/treatment for concern for incisional infection   K. Therese Sarah, M.D. [facility] [employer]

## 2017-10-04 NOTE — Progress Notes (Addendum)
Faculty Practice OB/GYN Attending Note  Subjective:  Patient has been improving since last night; she had above 13L of urine output after three doses of Lasix (80mg  po, then two 20 mg IV doses).  Her weight has gone down 21 lbs since yesterday (399 from 420)! She reports marked improvement in taking deep breaths and she is ambulating around room despite being on magnesium sulfate since 2300.  She looks like a different woman! She is bottlefeeding infant at her bedside; her mother is there to help her. She is only concerned that she had increased serous drainage from her incision after the Lasix but was reassured this was expected side effect of Lasix (fluid coming from everywhere). Denies any headaches, visual symptoms, RUQ/epigastric pain.   Admitted on 10/03/2017 for Pre-eclampsia, postpartum.    Objective:  Blood pressure (!) 148/105, pulse 90, temperature 98.6 F (37 C), temperature source Oral, resp. rate 19, weight (!) 181.3 kg, SpO2 94 %, currently breastfeeding.  Patient Vitals for the past 24 hrs:  BP Temp Temp src Pulse Resp SpO2 Weight  10/04/17 0705 (!) 148/105 98.6 F (37 C) Oral 90 19 94 % -  10/04/17 0600 131/85 98.1 F (36.7 C) - 99 20 99 % -  10/04/17 0557 - - - - - - (!) 181.3 kg  10/04/17 0500 (!) 143/107 - - 98 20 98 % -  10/04/17 0400 (!) 150/101 - - 99 20 100 % -  10/04/17 0300 (!) 165/108 - - 100 20 99 % -  10/04/17 0200 (!) 154/91 - - 99 20 99 % -  10/04/17 0100 (!) 153/111 - - 97 (!) 22 98 % -  10/04/17 0001 (!) 162/111 98.6 F (37 C) - 92 (!) 22 100 % -  10/03/17 2320 (!) 157/108 - - 92 (!) 22 100 % -  10/03/17 2305 (!) 154/80 - - 91 (!) 22 98 % -  10/03/17 2038 (!) 188/122 - - - - - -  10/03/17 2025 (!) 182/98 98.4 F (36.9 C) Oral 87 20 99 % (!) 188.7 kg  10/03/17 2022 (!) 188/122 - - 95 - - -  10/03/17 1831 (!) 162/96 99.1 F (37.3 C) Oral 93 20 99 % -    Intake/Output Summary (Last 24 hours) at 10/04/2017 0803 Last data filed at 10/04/2017 0755 Gross per 24  hour  Intake 1325 ml  Output 16109 ml  Net -11925 ml    Gen: NAD HENT: Normocephalic, atraumatic Lungs: Normal respiratory effort, CTAB Heart: Regular rate noted Abdomen: NT, less edematous, copious foul smelling thin watery serous drainage from incision, mild diffuse erythema, no induration Cervix: Deferred Ext: 2+ DTRs, 2+ BLE edema, no cyanosis, negative Homan's sign  CBC Latest Ref Rng & Units 10/03/2017 09/30/2017 09/28/2017  WBC 4.0 - 10.5 K/uL 11.4(H) 9.8 7.1  Hemoglobin 12.0 - 15.0 g/dL 6.0(A) 5.4(U) 10.0(L)  Hematocrit 36.0 - 46.0 % 26.4(L) 26.5(L) 31.5(L)  Platelets 150 - 400 K/uL 326 230 276   CMP Latest Ref Rng & Units 10/03/2017 09/30/2017 09/26/2017  Glucose 70 - 99 mg/dL 80 - 981(X)  BUN 6 - 20 mg/dL 10 - 10  Creatinine 9.14 - 1.00 mg/dL 7.82 9.56 2.13  Sodium 135 - 145 mmol/L 140 - 135  Potassium 3.5 - 5.1 mmol/L 3.5 - 4.2  Chloride 98 - 111 mmol/L 105 - 106  CO2 22 - 32 mmol/L 24 - 18(L)  Calcium 8.9 - 10.3 mg/dL 8.2(L) - 8.9  Total Protein 6.5 - 8.1 g/dL 0.8(M) -  6.6  Total Bilirubin 0.3 - 1.2 mg/dL 0.3 - 0.4  Alkaline Phos 38 - 126 U/L 63 - 73  AST 15 - 41 U/L 21 - 19  ALT 0 - 44 U/L 18 - 20   Dg Chest 2 View  Result Date: 10/03/2017 CLINICAL DATA:  Wheezing, Pre-eclampsia in postpartum period; Concern about pulmonary edema EXAM: CHEST - 2 VIEW COMPARISON:  05/26/2010 FINDINGS: Heart is enlarged. There is pulmonary vascular congestion. No overt alveolar edema. No consolidations. IMPRESSION: Cardiomegaly and pulmonary vascular congestion. Electronically Signed   By: Norva Pavlov M.D.   On: 10/03/2017 20:29    Assessment & Plan:  22 y.o. G1P1 readmitted for severe postpartum preeclampsia; s/p LTCS for FTP on 09/29/17 after failed IOL for GHTN.  Doing better after aggressive diuresis. - Continue Amlodipine 10 mg po daily for now for HTN maintenance therapy; Hydralazine IV for severe BP.  Consider adding Lisinopril to regimen if she still needs many Hydralazine doses  today. - Continue magnesium sulfate until 2300 today for eclampsia prophylaxis. - Continue Lasix; will check K today. Oral KDur 40 mg po bid presumptive ordered.  - Feraheme ordered for anemia with Hgb of 8.4, no need for transfusion for now - Follow up ECHO today given cardiomegaly seen on CXR - Continue Bactrim DS for infection prevention/treatment - Continue Daily weights, strict Is and Os - Regular diet - Lovenox for DVT/PE prophylaxis Continue close inpatient observation.   Jaynie Collins, MD, FACOG Obstetrician & Gynecologist, [facility] for [employer], [facility]

## 2017-10-04 NOTE — Progress Notes (Signed)
Spoke with primary RN about need for PICC placement , Patient has IV access at this time . Will continue to monitor need for PICC line.

## 2017-10-04 NOTE — Progress Notes (Signed)
  Echocardiogram 2D Echocardiogram has been performed.  Celene Skeen 10/04/2017, 10:05 AM

## 2017-10-04 NOTE — Plan of Care (Signed)
Pts. Condition will continue to improve 

## 2017-10-05 ENCOUNTER — Inpatient Hospital Stay (HOSPITAL_COMMUNITY)
Admission: AD | Admit: 2017-10-05 | Discharge: 2017-10-05 | Disposition: A | Discharge status: Home / Self Care | Payer: Federal, State, Local not specified - PPO | Source: Ambulatory Visit | Attending: Obstetrics and Gynecology | Admitting: Obstetrics and Gynecology

## 2017-10-05 ENCOUNTER — Encounter (HOSPITAL_COMMUNITY): Payer: Self-pay | Admitting: *Deleted

## 2017-10-05 DIAGNOSIS — Z6841 Body Mass Index (BMI) 40.0 and over, adult: Secondary | ICD-10-CM | POA: Diagnosis not present

## 2017-10-05 DIAGNOSIS — O1495 Unspecified pre-eclampsia, complicating the puerperium: Secondary | ICD-10-CM | POA: Insufficient documentation

## 2017-10-05 DIAGNOSIS — Z5189 Encounter for other specified aftercare: Secondary | ICD-10-CM

## 2017-10-05 DIAGNOSIS — Z883 Allergy status to other anti-infective agents status: Secondary | ICD-10-CM | POA: Diagnosis not present

## 2017-10-05 DIAGNOSIS — Z4801 Encounter for change or removal of surgical wound dressing: Secondary | ICD-10-CM | POA: Diagnosis not present

## 2017-10-05 DIAGNOSIS — K219 Gastro-esophageal reflux disease without esophagitis: Secondary | ICD-10-CM | POA: Insufficient documentation

## 2017-10-05 LAB — BASIC METABOLIC PANEL
Anion gap: 12 (ref 5–15)
BUN: 11 mg/dL (ref 6–20)
CHLORIDE: 101 mmol/L (ref 98–111)
CO2: 25 mmol/L (ref 22–32)
Calcium: 7.8 mg/dL — ABNORMAL LOW (ref 8.9–10.3)
Creatinine, Ser: 0.91 mg/dL (ref 0.44–1.00)
GFR calc Af Amer: >60 mL/min (ref >60)
GFR calc non Af Amer: >60 mL/min (ref >60)
GLUCOSE: 98 mg/dL (ref 70–99)
Potassium: 3.3 mmol/L — ABNORMAL LOW (ref 3.5–5.1)
Sodium: 138 mmol/L (ref 135–145)

## 2017-10-05 MED ORDER — POTASSIUM CHLORIDE CRYS ER 20 MEQ PO TBCR: 40.0000 meq | EXTENDED_RELEASE_TABLET | Freq: Two times a day (BID) | ORAL | 0 refills | Status: DC

## 2017-10-05 MED ORDER — FUROSEMIDE 20 MG PO TABS: 20.0000 mg | ORAL_TABLET | Freq: Every day | ORAL | 0 refills | Status: DC

## 2017-10-05 MED ORDER — CEPHALEXIN 500 MG PO CAPS: 500.0000 mg | ORAL_CAPSULE | Freq: Three times a day (TID) | ORAL | 0 refills | Status: DC

## 2017-10-05 MED ORDER — FUROSEMIDE 10 MG/ML IJ SOLN
40.0000 mg | Freq: Once | INTRAMUSCULAR | Status: AC
  Administered 2017-10-05: 40 mg via INTRAMUSCULAR
  Filled 2017-10-05: qty 4

## 2017-10-05 MED ORDER — POTASSIUM CHLORIDE 10 MEQ/100ML IV SOLN: 10.0000 meq | INTRAVENOUS | Status: DC

## 2017-10-05 MED ORDER — AMLODIPINE BESYLATE 10 MG PO TABS: 10.0000 mg | ORAL_TABLET | Freq: Every day | ORAL | 1 refills | Status: DC

## 2017-10-05 MED ORDER — HYDRALAZINE HCL 20 MG/ML IJ SOLN
10.0000 mg | Freq: Once | INTRAMUSCULAR | Status: AC
  Administered 2017-10-05: 10 mg via INTRAMUSCULAR
  Filled 2017-10-05: qty 1

## 2017-10-05 MED ORDER — FUROSEMIDE 20 MG PO TABS
20.0000 mg | ORAL_TABLET | Freq: Every day | ORAL | Status: DC
  Administered 2017-10-05: 20 mg via ORAL
  Filled 2017-10-05 (×2): qty 1

## 2017-10-05 MED ORDER — CEPHALEXIN 500 MG PO CAPS
500.0000 mg | ORAL_CAPSULE | Freq: Three times a day (TID) | ORAL | Status: DC
  Administered 2017-10-05: 500 mg via ORAL
  Filled 2017-10-05: qty 1

## 2017-10-05 NOTE — MAU Provider Note (Signed)
History  Patient is 22 year old female in today with complaints of bleeding from her dressing and incision patient had a primary low transverse cesarean section on 9/ 1 was discharged on 9 /.3.  Was readmitted on 9 5 for pulmonary edema and wound cellulitis discharged on 9/. 7.  CSN: [id number]  Arrival date & time 10/05/17  1843   None     Chief Complaint  Patient presents with  . Wound Check    HPI  Past Medical History:  Diagnosis Date  . Allergy   . GERD (gastroesophageal reflux disease)   . Hypertension   . Morbid obesity with BMI of 70 and over, adult (HCC) 09/29/2017   with hypertension     Past Surgical History:  Procedure Laterality Date  . CESAREAN SECTION N/A 09/29/2017   Procedure: CESAREAN SECTION;  Surgeon: Tilda Burrow, MD;  Location: [facility] BIRTHING SUITES;  Service: Obstetrics;  Laterality: N/A;  . MYRINGOTOMY    . TONSILLECTOMY AND ADENOIDECTOMY      Family History  Problem Relation Age of Onset  . Asthma Mother   . Anemia Mother   . Hypertension Father   . Cancer Maternal Grandfather   . Hypertension Paternal Grandmother   . Heart failure Paternal Grandmother     Social History   Tobacco Use  . Smoking status: Never Smoker  . Smokeless tobacco: Never Used  Substance Use Topics  . Alcohol use: No  . Drug use: No    Comment: THC positive noted in chart 03/21/2017    OB History    Gravida  1   Para      Term      Preterm      AB      Living  1     SAB      TAB      Ectopic      Multiple      Live Births              Review of Systems  Constitutional: Negative.   HENT: Negative.   Eyes: Negative.   Respiratory: Negative.   Cardiovascular: Positive for leg swelling.  Gastrointestinal: Negative.   Endocrine: Negative.   Genitourinary: Negative.   Musculoskeletal: Negative.   Skin: Negative.   Allergic/Immunologic: Negative.   Neurological: Negative.   Hematological: Negative.   Psychiatric/Behavioral: Negative.      Allergies  Diflucan [fluconazole]  Home Medications    BP (!) 138/104 (BP Location: Right Arm)   Pulse (!) 119   Temp 98.3 F (36.8 C) (Oral)   Resp 18   SpO2 97% Comment: ra  Physical Exam  Constitutional: She is oriented to person, place, and time. She appears well-developed and well-nourished.  HENT:  Head: Normocephalic.  Neck: Normal range of motion.  Cardiovascular: Normal rate, regular rhythm, normal heart sounds and intact distal pulses.  Pulmonary/Chest: Effort normal and breath sounds normal.  Abdominal: Soft. Bowel sounds are normal.  Musculoskeletal: Normal range of motion.  Neurological: She is alert and oriented to person, place, and time.  Skin: Skin is warm and dry.  Psychiatric: She has a normal mood and affect. Her behavior is normal. Judgment and thought content normal.    MAU Course  Procedures (including critical care time)  Labs Reviewed - No data to display Dg Chest 2 View  Result Date: 10/03/2017 CLINICAL DATA:  Wheezing, Pre-eclampsia in postpartum period; Concern about pulmonary edema EXAM: CHEST - 2 VIEW COMPARISON:  05/26/2010 FINDINGS: Heart is enlarged.  There is pulmonary vascular congestion. No overt alveolar edema. No consolidations. IMPRESSION: Cardiomegaly and pulmonary vascular congestion. Electronically Signed   By: Norva Pavlov M.D.   On: 10/03/2017 20:29     1. Visit for wound check   2. Preeclampsia in postpartum period   3. Morbid obesity (HCC)       MDM  Plan of care discussed with Dr. Alysia Penna.  Patient's wound VAC was removed area cleaned thoroughly with peroxide no active bleeding noted no redness no swelling, pressure dressing was applied.  Patient tolerated procedure well.  Patient is noted to have elevation in her blood pressure and 1+ edema at present time.  Heart is regular to rhythm and rate, lungs are clear to auscultation bilaterally.  Will treat with IM hydralazine and Lasix and discharged home in stable condition  to follow-up with Dr. Christin Bach on Monday.

## 2017-10-05 NOTE — Progress Notes (Signed)
Dr Jolayne Panther notified of Bps 171/92 and 179/102, will give apresoline.

## 2017-10-05 NOTE — Progress Notes (Signed)
Patient ID: YAZLYN PROCK, female   DOB: [birth year], 22 y.o.   MRN: [id number] HD # 2 SPEC POD # 6 LTCS  Pt reports continues to feel much better today. Sitting up on side of bed without complaints. Denies CP, SOB or DOE. Ambulating in hallway without problems. Denies HA, visual changes or epigastric pain. Tolerating diet. Pain controlled. Breast pumping.  PE AF BP 140-150's/80 's today  Last evening 170's/100's  Wt continues to trend down  Lungs clear Neck No JVD   Heart RRR Abd soft + BS obese LTCS with Prevena in place non tender Ext 2-3 + edema  A/P SPEC  S/P magnesium Nl ECHO        POD # 6 LTCS        Hypokalemia        Stable. Will replace K. Recheck BMP in AM. Continue with Norvasc for BP. Start Lasix orally daily. Continue with Lovenox. Encourage ambulation. Will switch from Bacterium to Keflex for better nephrotoxicity profile. No frank evidence of wound infection but will continue with antibiotics and wound vac.

## 2017-10-05 NOTE — Progress Notes (Signed)
Pt discharged home with printed instructions. Pt and pt's mother verbalized an understanding. No concerns noted. Carmelina Dane, RN

## 2017-10-05 NOTE — Discharge Summary (Signed)
Physician Discharge Summary  Patient ID: Anna [name] MRN: [id number] DOB/AGE: [birth year] 22 y.o.  Admit date: 10/03/2017 Discharge date: 10/05/2017  Admission Diagnoses: Postpartum Severe Preeclampsia  Discharge Diagnoses:  Principal Problem:   Pre-eclampsia, postpartum Active Problems:   Morbid obesity with BMI of 70 and over, adult [facility])   Postoperative wound cellulitis   Postoperative anemia   Discharged Condition: good  Hospital Course: Ms Margit Hanks was admitted with above Dx. She received magnesium therapy x 24 hrs. Was diuresised with IV Lasix with excellent response. Maintained with oral Lasix.  ECHO demonstrated normal EF and diastolic function. She received a mid line d/t to poor IV access. BP medications were adjusted for satisfactory control.   She had her Prevena replaced during her hospitalization. No frank S/Sx of wound infection but had some generalized edema around wound. She was placed on Bacterium at admission but changed to Keflex d/t to better nephrotoxicity profile.   She did develop some hypokalemia d/t to her diuresis which was corrected with IV runs and maintained with oral supplement  On HD # 2 felt pt was amendable for discharge home. Pt was ambulating without problems, voiding, tolerating diet and good oral pain control. No S/Sx of wound infection or PEC.  Discharge instructions, medications and follow up were reviewed with the pt. She verbalized understanding.    Consults: cardiology  Significant Diagnostic Studies: labs and ECHO  Treatments: IV hydration, antibiotics:  cardiac meds: amlodipine and furosemide and anticoagulation: LMW heparin  Discharge Exam: Blood pressure (!) 136/96, pulse (!) 103, temperature 98.6 F (37 C), temperature source Oral, resp. rate 18, height 5' (1.524 m), weight (!) 177.8 kg, SpO2 97 %, currently breastfeeding.  PE AF VSS Lungs clear Heart RRR Abd soft + BS obese uterus firm Prevena dressing intact Ext 2-3 + edema  no evidence of DVT   Disposition: Discharge disposition: 01-Home or Self Care       Discharge Instructions    Call MD for:  difficulty breathing, headache or visual disturbances   Complete by:  As directed    Call MD for:  extreme fatigue   Complete by:  As directed    Call MD for:  persistant dizziness or light-headedness   Complete by:  As directed    Call MD for:  persistant nausea and vomiting   Complete by:  As directed    Call MD for:  redness, tenderness, or signs of infection (pain, swelling, redness, odor or green/yellow discharge around incision site)   Complete by:  As directed    Call MD for:  severe uncontrolled pain   Complete by:  As directed    Call MD for:  temperature >100.4   Complete by:  As directed    Diet - low sodium heart healthy   Complete by:  As directed    Sexual acrtivity   Complete by:  As directed    Pelvic rest x 6 weeks     Allergies as of 10/05/2017      Reactions   Diflucan [fluconazole] Rash      Medication List    STOP taking these medications   cetirizine 5 MG tablet Commonly known as:  ZYRTEC     TAKE these medications   amLODipine 10 MG tablet Commonly known as:  NORVASC Take 1 tablet (10 mg total) by mouth daily. Start taking on:  10/06/2017 What changed:    medication strength  how much to take   cephALEXin 500 MG capsule Commonly known  as:  KEFLEX Take 1 capsule (500 mg total) by mouth every 8 (eight) hours.   ferrous sulfate 325 (65 FE) MG tablet Take 325 mg by mouth once a week.   furosemide 20 MG tablet Commonly known as:  LASIX Take 1 tablet (20 mg total) by mouth daily. Start taking on:  10/06/2017   ibuprofen 600 MG tablet Commonly known as:  ADVIL,MOTRIN Take 1 tablet (600 mg total) by mouth every 6 (six) hours.   oxyCODONE 5 MG immediate release tablet Commonly known as:  Oxy IR/ROXICODONE Take 1 tablet (5 mg total) by mouth every 4 (four) hours as needed for severe pain.   PNV PRENATAL PLUS  MULTIVITAMIN 27-1 MG Tabs Take 1 tablet by mouth daily.   potassium chloride SA 20 MEQ tablet Commonly known as:  K-DUR,KLOR-CON Take 2 tablets (40 mEq total) by mouth 2 (two) times daily for 7 days.   senna-docusate 8.6-50 MG tablet Commonly known as:  Senokot-S Take 2 tablets by mouth daily.      Follow-up Information    [facility] Follow up.   Specialty:  Obstetrics and Gynecology Why:  Pt already has follow up appt on Monday Contact information: [address] [place] [address] [phone]          Signed: Hermina Staggers 10/05/2017, 1:48 PM

## 2017-10-05 NOTE — MAU Note (Signed)
C/s was on 9/1 Presents today with bleeding from wound vac States bleeding went through clothes Reports soreness but denies any other pain

## 2017-10-05 NOTE — Discharge Instructions (Signed)

## 2017-10-05 NOTE — Progress Notes (Signed)
Pt educated and instructed on wound vac care. New drainage cartridge placed. Batteries working. Green light indicator on. Carmelina Dane, RN

## 2017-10-06 ENCOUNTER — Inpatient Hospital Stay (HOSPITAL_COMMUNITY)
Admission: AD | Admit: 2017-10-06 | Discharge: 2017-10-06 | Disposition: A | Discharge status: Home / Self Care | Payer: Federal, State, Local not specified - PPO | Source: Ambulatory Visit | Attending: Obstetrics and Gynecology | Admitting: Obstetrics and Gynecology

## 2017-10-06 DIAGNOSIS — Z8249 Family history of ischemic heart disease and other diseases of the circulatory system: Secondary | ICD-10-CM | POA: Diagnosis not present

## 2017-10-06 DIAGNOSIS — T8189XA Other complications of procedures, not elsewhere classified, initial encounter: Secondary | ICD-10-CM | POA: Diagnosis not present

## 2017-10-06 DIAGNOSIS — Z883 Allergy status to other anti-infective agents status: Secondary | ICD-10-CM | POA: Insufficient documentation

## 2017-10-06 DIAGNOSIS — Z6841 Body Mass Index (BMI) 40.0 and over, adult: Secondary | ICD-10-CM | POA: Diagnosis not present

## 2017-10-06 DIAGNOSIS — Z79899 Other long term (current) drug therapy: Secondary | ICD-10-CM | POA: Insufficient documentation

## 2017-10-06 DIAGNOSIS — I10 Essential (primary) hypertension: Secondary | ICD-10-CM | POA: Diagnosis not present

## 2017-10-06 DIAGNOSIS — Z832 Family history of diseases of the blood and blood-forming organs and certain disorders involving the immune mechanism: Secondary | ICD-10-CM | POA: Insufficient documentation

## 2017-10-06 DIAGNOSIS — Z9889 Other specified postprocedural states: Secondary | ICD-10-CM | POA: Diagnosis not present

## 2017-10-06 DIAGNOSIS — Z825 Family history of asthma and other chronic lower respiratory diseases: Secondary | ICD-10-CM | POA: Diagnosis not present

## 2017-10-06 LAB — TYPE AND SCREEN
ABO/RH(D): O POS
Antibody Screen: NEGATIVE
UNIT DIVISION: 0
UNIT DIVISION: 0

## 2017-10-06 LAB — BPAM RBC
Blood Product Expiration Date: 201910022359
Blood Product Expiration Date: 201910052359
UNIT TYPE AND RH: 9500
Unit Type and Rh: 5100

## 2017-10-06 NOTE — MAU Provider Note (Signed)
History     CSN: [id number]  Arrival date and time: 10/06/17 1914   First Provider Initiated Contact with Patient 10/06/17 [id number]      Chief Complaint  Patient presents with  . incision bleeding   Anna [name] is a 22 y.o. G1P1 who is 8 days S/P LTCS. She was seen here yesterday with bleeding from her incision. Wound vac was removed, and pressure dressing was applied yesterday. She was also readmitted on 10/03/17 with pulmonary edema and wound cellulitis. She reports that since DC she has been taking her antibiotics as prescribed. She denies any pain.   Wound Check  She was originally treated 5 to 10 days ago. Prior ED Treatment: LTCS. There has been clear and bloody discharge from the wound. There is no redness present. The swelling has not changed. There is no pain present.    OB History    Gravida  1   Para      Term      Preterm      AB      Living  1     SAB      TAB      Ectopic      Multiple      Live Births              Past Medical History:  Diagnosis Date  . Allergy   . GERD (gastroesophageal reflux disease)   . Hypertension   . Morbid obesity with BMI of 70 and over, adult (HCC) 09/29/2017   with hypertension     Past Surgical History:  Procedure Laterality Date  . CESAREAN SECTION N/A 09/29/2017   Procedure: CESAREAN SECTION;  Surgeon: Tilda Burrow, MD;  Location: [facility] BIRTHING SUITES;  Service: Obstetrics;  Laterality: N/A;  . MYRINGOTOMY    . TONSILLECTOMY AND ADENOIDECTOMY      Family History  Problem Relation Age of Onset  . Asthma Mother   . Anemia Mother   . Hypertension Father   . Cancer Maternal Grandfather   . Hypertension Paternal Grandmother   . Heart failure Paternal Grandmother     Social History   Tobacco Use  . Smoking status: Never Smoker  . Smokeless tobacco: Never Used  Substance Use Topics  . Alcohol use: No  . Drug use: No    Comment: THC positive noted in chart 03/21/2017    Allergies:  Allergies   Allergen Reactions  . Diflucan [Fluconazole] Rash    Medications Prior to Admission  Medication Sig Dispense Refill Last Dose  . amLODipine (NORVASC) 10 MG tablet Take 1 tablet (10 mg total) by mouth daily. 30 tablet 1   . cephALEXin (KEFLEX) 500 MG capsule Take 1 capsule (500 mg total) by mouth every 8 (eight) hours. 21 capsule 0   . ferrous sulfate 325 (65 FE) MG tablet Take 325 mg by mouth once a week.    Past Week at Unknown time  . furosemide (LASIX) 20 MG tablet Take 1 tablet (20 mg total) by mouth daily. 7 tablet 0   . ibuprofen (ADVIL,MOTRIN) 600 MG tablet Take 1 tablet (600 mg total) by mouth every 6 (six) hours. 30 tablet 0 Past Week at Unknown time  . oxyCODONE (OXY IR/ROXICODONE) 5 MG immediate release tablet Take 1 tablet (5 mg total) by mouth every 4 (four) hours as needed for severe pain. 30 tablet 0 Past Week at Unknown time  . potassium chloride SA (K-DUR,KLOR-CON) 20 MEQ tablet Take 2 tablets (  40 mEq total) by mouth 2 (two) times daily for 7 days. 28 tablet 0   . Prenatal Vit-Fe Fumarate-FA (PNV PRENATAL PLUS MULTIVITAMIN) 27-1 MG TABS Take 1 tablet by mouth daily. 30 tablet 11 Past Week at Unknown time  . senna-docusate (SENOKOT-S) 8.6-50 MG tablet Take 2 tablets by mouth daily. 30 tablet 0 Past Week at Unknown time    Review of Systems  Constitutional: Negative for chills and fever.  Respiratory: Negative for shortness of breath and wheezing.   Gastrointestinal: Negative for nausea and vomiting.  Genitourinary: Negative for dysuria.   Physical Exam   Blood pressure 139/86, pulse (!) 102, temperature 98.1 F (36.7 C), resp. rate 16, height 5' (1.524 m), weight (!) 176 kg, currently breastfeeding.  Physical Exam  Nursing note and vitals reviewed. Constitutional: She is oriented to person, place, and time. She appears well-developed and well-nourished. No distress.  HENT:  Head: Normocephalic.  Cardiovascular: Normal rate.  Respiratory: Effort normal.  GI: Soft.  There is no tenderness. There is no rebound.  Large amount of edema noted in panus just above c-section incision. Pressure dressing removed, and saturated with blood and serosanguinous discharge. Incision under the soaked dressing is clean and intact.   Neurological: She is alert and oriented to person, place, and time.  Skin: Skin is warm and dry.  Psychiatric: She has a normal mood and affect.    MAU Course  Procedures  MDM Likely a combination of blood mixed with leaking edema from abdomen is saturating dressing. The incision does appear to be healing well, and infection is improving. No odor or redness noted. Incision is intact. New pressure dressing applied. [facility] as will likely need another dressing change and may benefit from home health services to assist with wound care.   Assessment and Plan   1. Delayed surgical wound healing, initial encounter    DC home Comfort measures reviewed  RX: no new rx  Return to MAU as needed FU with OB as planned  Follow-up Information    [facility] Follow up.   Specialty:  Obstetrics and Gynecology Contact information: [address] [place] [address] [phone]           Thressa Sheller 10/06/2017, 7:40 AM

## 2017-10-06 NOTE — Discharge Instructions (Signed)
     Delayed Wound Closure Sometimes, your health care provider will decide to delay closing a wound for several days. This is done when the wound is badly bruised, dirty, or when it has been several hours since the injury happened. By delaying the closure of your wound, the risk of infection is reduced. Wounds that are closed in 3-7 days after being cleaned up and dressed heal just as well as those that are closed right away. Follow these instructions at home:  Rest and elevate the injured area until the pain and swelling are gone.  Have your wound checked as instructed by your health care provider. Contact a health care provider if:  You develop unusual or increased swelling or redness around the wound.  You have increasing pain or tenderness.  There is increasing fluid (drainage) or a bad smelling drainage coming from the wound. This information is not intended to replace advice given to you by your health care provider. Make sure you discuss any questions you have with your health care provider. Document Released: 01/15/2005 Document Revised: 06/23/2015 Document Reviewed: 07/15/2012 Elsevier Interactive Patient Education  2017 [employer]  

## 2017-10-07 ENCOUNTER — Ambulatory Visit (INDEPENDENT_AMBULATORY_CARE_PROVIDER_SITE_OTHER): Payer: Federal, State, Local not specified - PPO | Admitting: Obstetrics and Gynecology

## 2017-10-07 ENCOUNTER — Encounter: Payer: Self-pay | Admitting: Obstetrics and Gynecology

## 2017-10-07 VITALS — BP 139/92 | HR 99 | Ht 61.0 in | Wt 384.8 lb

## 2017-10-07 DIAGNOSIS — O1415 Severe pre-eclampsia, complicating the puerperium: Secondary | ICD-10-CM | POA: Diagnosis not present

## 2017-10-07 MED ORDER — NIFEDIPINE ER OSMOTIC RELEASE 30 MG PO TB24: 30.0000 mg | ORAL_TABLET | Freq: Every day | ORAL | 1 refills | Status: DC

## 2017-10-07 NOTE — Progress Notes (Addendum)
Patient ID: Anna [name], female   DOB: [birth year], 22 y.o.   MRN: [id number]  Subjective:  Anna [name] is a 22 y.o. female now 1 weeks status post C-Section.  Incision was leaking fluid on bandages but doctor said it was ok and to make appointment with Dr.Ferg. Patient was scared that she was not going to make it while being in the hospital and is tearful at end of visit Review of Systems Negative except weepy tissue around skin edges, some fresh bleeding from incision edge.   Diet:   normal   Bowel movements : normal.  Pain is controlled with current analgesics. Medications being used: narcotic analgesics including oxycodone.  Objective:  BP (!) 139/92 (BP Location: Left Arm, Patient Position: Sitting, Cuff Size: Normal)   Pulse 99   Ht 5\' 1"  (1.549 m)   Wt (!) 384 lb 12.8 oz (174.5 kg)   Breastfeeding? No   BMI 72.71 kg/m  General:Well developed, well nourished.  No acute distress. Abdomen: Bowel sounds normal, soft, non-tender. Pelvic Exam: NOT DONE  Incision(s): Light bleeding with weepy fluid from skin edges, Healing, no erythema, no hernia, no swelling, no dehiscence, Dressing change  Assessment:  Post-Op 2 weeks s/p C-Section     Plan:  1.Wound care discussed. Change abdominal bandage more frequently  I.e. q day 2. Increase BP med Tx: Rx Procardia xl 30 q d to current meds 3 discussed importance of lifestyle changes , pt alleges many changes being made. 3. Activity restrictions: none 4. Follow up in 1 week. For PP visit bp recheck 5 strategy for weight loss of 100 lb over 1 yr reviewed.   By signing my name below, I, Arnette Norris, attest that this documentation has been prepared under the direction and in the presence of Tilda Burrow, MD. Electronically Signed: Arnette Norris Medical Scribe. 10/07/17. 1:52 PM.  I personally performed the services described in this documentation, which was SCRIBED in my presence. The recorded information has been  reviewed and considered accurate. It has been edited as necessary during review. Tilda Burrow, MD

## 2017-10-08 ENCOUNTER — Telehealth: Payer: Self-pay | Admitting: *Deleted

## 2017-10-08 NOTE — Telephone Encounter (Signed)
Patient called stating when she went to pick up meds at pharmacy, pharmacist wanted to clarify that she needed the 2 types of BP medication that was prescribed.  I explained to the patient that another medication was needed but the pharmacy would not fill until clarification was received.  Please advise.

## 2017-10-14 ENCOUNTER — Ambulatory Visit (INDEPENDENT_AMBULATORY_CARE_PROVIDER_SITE_OTHER): Payer: Federal, State, Local not specified - PPO | Admitting: Obstetrics and Gynecology

## 2017-10-14 ENCOUNTER — Encounter: Payer: Self-pay | Admitting: Obstetrics and Gynecology

## 2017-10-14 VITALS — BP 144/90 | HR 97 | Ht 62.0 in | Wt 370.2 lb

## 2017-10-14 DIAGNOSIS — O165 Unspecified maternal hypertension, complicating the puerperium: Secondary | ICD-10-CM | POA: Diagnosis not present

## 2017-10-14 MED ORDER — FUROSEMIDE 20 MG PO TABS: 20.0000 mg | ORAL_TABLET | Freq: Every day | ORAL | 0 refills | Status: DC

## 2017-10-14 NOTE — Progress Notes (Signed)
   Subjective:  Anna [name] is a 22 y.o. female now 2 weeks 1 day status post c-section.  Postpartum course has been complicated by postpartum preeclampsia and fluid retention with diuresis from peak weight of 420 pounds to her current weight of 370.  That is a  50 pound weight loss so far she continues to have some peau d'orange changes of her pannus She is having some weepy serous fluid from the incision Review of Systems Negative except as finished 7 days of diuretic Lasix 20 milligrams 1 daily   Diet:   Regular   Bowel movements : normal.  The patient is not having any pain.  Objective:  BP (!) 144/90 (BP Location: Right Arm, Patient Position: Sitting, Cuff Size: Normal)   Pulse 97   Ht 5\' 2"  (1.575 m)   Wt (!) 370 lb 3.2 oz (167.9 kg)   BMI 67.71 kg/m  General:Well developed, well nourished.  No acute distress. Abdomen: Bowel sounds normal, soft, non-tender.  The incision has 3 with small 5 mm areas of separation that is continued to drain serous fluid, nonpurulent.  The abdominal pannus has some dimpling due to fluid retention Pelvic Exam:   Incision(s): N/A 3 small areas of separation 1 cm or less adequate healing silver nitrate used on each of these openings     Assessment:  Post-Op 2 weeks 1 day s/p c-section   Gradual improvement postoperatively. Resolved preeclampsia Chronic postpartum hypertension Plan:  1.Wound care discussed   2. . current medications.  Norvasc 10 mg daily, will add back Lasix 20 p.o. daily x2 weeks 3. Activity restrictions: no lifting more than Her body weight pounds 4. return to work: not applicable. 5. Follow up in 3 weeks.  By signing my name below, I, Anna [name], attest that this documentation has been prepared under the direction and in the presence of Tilda BurrowFerguson, Ignacio Lowder V, MD. Electronically Signed: Soijett [name], Medical Scribe. 10/14/17. 4:04 PM.  I personally performed the services described in this documentation, which was  SCRIBED in my presence. The recorded information has been reviewed and considered accurate. It has been edited as necessary during review. Tilda BurrowJohn V Shanty Ginty, MD

## 2017-10-18 ENCOUNTER — Encounter: Payer: Self-pay | Admitting: *Deleted

## 2017-10-22 DIAGNOSIS — Z029 Encounter for administrative examinations, unspecified: Secondary | ICD-10-CM

## 2017-11-04 ENCOUNTER — Encounter: Payer: Federal, State, Local not specified - PPO | Admitting: Family Medicine

## 2017-11-06 ENCOUNTER — Ambulatory Visit (INDEPENDENT_AMBULATORY_CARE_PROVIDER_SITE_OTHER): Payer: Federal, State, Local not specified - PPO | Admitting: Obstetrics and Gynecology

## 2017-11-06 ENCOUNTER — Other Ambulatory Visit: Payer: Self-pay

## 2017-11-06 ENCOUNTER — Encounter: Payer: Self-pay | Admitting: Obstetrics and Gynecology

## 2017-11-06 NOTE — Progress Notes (Addendum)
Patient ID: Anna [name], female   DOB: [birth year], 22 y.o.   MRN: [id number]  Subjective: Postpartum visit 5 weeks after cesarean, status post postpartum preeclampsia    Anna [name] is a 22 y.o. G62P1001 African American female who presents for a postpartum visit. She is 5 weeks postpartum following a C-section at 40 gestational weeks. Anesthesia: epidural. I have fully reviewed the prenatal and intrapartum course.Marland Kitchen Postpartum course notable for postpartum preeclampsia requiring readmission when her admission weight was 420 pounds compared to last antenatal visit weight of 406   baby is feeding by bottle Gerber Bleeding no bleeding. Bowel function is normal. Bladder function is normal. Patient is not sexually active. Contraception method is abstinence.   Last pap 03/21/17 and was normal.  Incision is not weeping fluid and patient denies bleeding. She still puts gauze on  Incision. Granular tissue present. Hurricane numbing spray was applied   The following portions of the patient's history were reviewed and updated as appropriate: allergies, current medications, past medical history, past surgical history and problem list.  Review of Systems Pertinent items are noted in HPI.  Weight of 371 pounds is 49 pounds less than her admission weight for postpartum preeclampsia  Vitals:   11/06/17 1519  BP: 126/79  Pulse: 87  Weight: (!) 371 lb (168.3 kg)  Height: 5\' 2"  (1.575 m)    Objective:   General:  alert, cooperative and no distress   Breasts:  deferred, no complaints  Lungs: clear to auscultation bilaterally  Heart:  regular rate and rhythm  Abdomen: soft, nontender   Exam limited by marked obesity with large pannus   Granulation tissue at 3 sites along the incision, a 1 cm x 1.5 cm area on each end of the incision, and a tiny pinhole with granulation tissue protruding 8 mm from the pinhole, located in the midline of the incision.  All 3 sites are treated with silver nitrate  after placing topical anesthetic                    Assessment:  Incision and granulation tissue x3 sites, treated silver nitrate Postpartum exam 5 wks s/p C-section Bottle-feeding Resolved postpartum hypertension/preeclampsia Plan:  Follow up in: 1 week for follow up granulation tissues, retreatment or excision discontinue nifedipine (procardia) (on 2 calcium channel blockers) Continue Norvasc  By signing my name below, I, Arnette Norris, attest that this documentation has been prepared under the direction and in the presence of Tilda Burrow, MD. Electronically Signed: Arnette Norris Medical Scribe. 11/06/17. 3:27 PM.  I personally performed the services described in this documentation, which was SCRIBED in my presence. The recorded information has been reviewed and considered accurate. It has been edited as necessary during review. Tilda Burrow, MD

## 2017-11-13 ENCOUNTER — Encounter: Payer: Federal, State, Local not specified - PPO | Admitting: Family Medicine

## 2017-11-13 ENCOUNTER — Encounter: Payer: Federal, State, Local not specified - PPO | Admitting: Obstetrics and Gynecology

## 2017-12-06 ENCOUNTER — Encounter: Payer: Federal, State, Local not specified - PPO | Admitting: Obstetrics and Gynecology

## 2017-12-12 ENCOUNTER — Encounter: Payer: Self-pay | Admitting: *Deleted

## 2017-12-12 ENCOUNTER — Encounter: Payer: Federal, State, Local not specified - PPO | Admitting: Family Medicine

## 2018-04-02 ENCOUNTER — Encounter: Payer: Self-pay | Admitting: Women's Health

## 2018-04-02 ENCOUNTER — Other Ambulatory Visit: Payer: Self-pay | Admitting: Women's Health

## 2018-04-09 DIAGNOSIS — S40851A Superficial foreign body of right upper arm, initial encounter: Secondary | ICD-10-CM | POA: Diagnosis not present

## 2018-05-07 ENCOUNTER — Telehealth: Payer: Medicaid Other | Admitting: Nurse Practitioner

## 2018-05-07 DIAGNOSIS — J029 Acute pharyngitis, unspecified: Secondary | ICD-10-CM | POA: Diagnosis not present

## 2018-05-07 NOTE — Progress Notes (Signed)
We are sorry that you are not feeling well.  Here is how we plan to help!  Your symptoms indicate a likely viral infection (Pharyngitis).   Pharyngitis is inflammation in the back of the throat which can cause a sore throat, scratchiness and sometimes difficulty swallowing.   Pharyngitis is typically caused by a respiratory virus and will just run its course.  Please keep in mind that your symptoms could last up to 10 days.  For throat pain, we recommend over the counter oral pain relief medications such as acetaminophen or aspirin, or anti-inflammatory medications such as ibuprofen or naproxen sodium.  Topical treatments such as oral throat lozenges or sprays may be used as needed.  Avoid close contact with loved ones, especially the very young and elderly.  Remember to wash your hands thoroughly throughout the day as this is the number one way to prevent the spread of infection and wipe down door knobs and counters with disinfectant.  After careful review of your answers, I would not recommend and antibiotic for your condition.  Antibiotics should not be used to treat conditions that we suspect are caused by viruses like the virus that causes the common cold or flu. However, some people can have Strep with atypical symptoms. You may need formal testing in clinic or office to confirm if your symptoms continue or worsen.  Providers prescribe antibiotics to treat infections caused by bacteria. Antibiotics are very powerful in treating bacterial infections when they are used properly.  To maintain their effectiveness, they should be used only when necessary.  Overuse of antibiotics has resulted in the development of super bugs that are resistant to treatment!    Home Care:  Only take medications as instructed by your medical team.  Do not drink alcohol while taking these medications.  A steam or ultrasonic humidifier can help congestion.  You can place a towel over your head and breathe in the steam from  hot water coming from a faucet.  Avoid close contacts especially the very young and the elderly.  Cover your mouth when you cough or sneeze.  Always remember to wash your hands.  Get Help Right Away If:  You develop worsening fever or throat pain.  You develop a severe head ache or visual changes.  Your symptoms persist after you have completed your treatment plan.  Make sure you  Understand these instructions.  Will watch your condition.  Will get help right away if you are not doing well or get worse.  Your e-visit answers were reviewed by a board certified advanced clinical practitioner to complete your personal care plan.  Depending on the condition, your plan could have included both over the counter or prescription medications.  If there is a problem please reply  once you have received a response from your provider.  Your safety is important to us.  If you have drug allergies check your prescription carefully.    You can use MyChart to ask questions about todays visit, request a non-urgent call back, or ask for a work or school excuse for 24 hours related to this e-Visit. If it has been greater than 24 hours you will need to follow up with your provider, or enter a new e-Visit to address those concerns.  You will get an e-mail in the next two days asking about your experience.  I hope that your e-visit has been valuable and will speed your recovery. Thank you for using e-visits.   5 minutes spent reviewing   and documenting in chart.  

## 2018-07-13 ENCOUNTER — Ambulatory Visit (INDEPENDENT_AMBULATORY_CARE_PROVIDER_SITE_OTHER)
Admission: RE | Admit: 2018-07-13 | Discharge: 2018-07-13 | Disposition: A | Discharge status: Home / Self Care | Payer: Medicaid Other | Source: Ambulatory Visit

## 2018-07-13 DIAGNOSIS — J019 Acute sinusitis, unspecified: Secondary | ICD-10-CM

## 2018-07-13 MED ORDER — FLUTICASONE PROPIONATE 50 MCG/ACT NA SUSP: 1.0000 | Freq: Every day | NASAL | 2 refills | Status: DC

## 2018-07-13 MED ORDER — AMOXICILLIN-POT CLAVULANATE 875-125 MG PO TABS: 1.0000 | ORAL_TABLET | Freq: Two times a day (BID) | ORAL | 0 refills | Status: AC

## 2018-07-13 NOTE — Discharge Instructions (Signed)
Push fluids to ensure adequate hydration and keep secretions thin.  Tylenol and/or ibuprofen as needed for pain or fevers.  Complete course of antibiotics.   Daily nasal spray.  I do recommend self isolation for at least another 7 days as these symptoms can also be Covid-19.  If symptoms worsen or do not improve in the next week to return to be seen or to follow up with your PCP.

## 2018-07-13 NOTE — ED Provider Notes (Signed)
Virtual Visit via Video Note:  Anna [name]  initiated request for Telemedicine visit with [facility] team. I connected with Anna [name]  on 07/13/2018 at 12:29 PM  for a synchronized telemedicine visit using a video enabled HIPPA compliant telemedicine application. I verified that I am speaking with Anna [name]  using two identifiers. Anna Gottron, NP  was physically located in a [facility] Urgent care site and Anna [name] was located at a different location.   The limitations of evaluation and management by telemedicine as well as the availability of in-person appointments were discussed. Patient was informed that she  may incur a bill ( including co-pay) for this virtual visit encounter. Anna [name]  expressed understanding and gave verbal consent to proceed with virtual visit.     History of Present Illness:Anna [name]  is a 23 y.o. female presents with complaints of sore throat and left ear pain. Started 1 week ago. Causing headache. No fevers. No cough or congestion. No ear drainage. No pain with ear movement. Pain with swallowing. Post nasal drainage. Her child is also ill with congestion. No chest pain  Or shortness of breath. No loss of taste or smell. No gi symptoms. Has been taking tylenol for headache, which minimally helps. Last taken today. Pain 6/10. Pain behind eye. Facial pressure. Not currently working. Her father is working, recently tested negative for covid-19. Hx of strep throat and ear infections. Feels similar. Headache feels worse than usual with these. Has not been able to check her BP. No chest pain  Or dizziness.   Past Medical History:  Diagnosis Date  . Allergy   . GERD (gastroesophageal reflux disease)   . Hypertension   . Morbid obesity with BMI of 70 and over, adult ([place]) 09/29/2017   with hypertension     Allergies  Allergen Reactions  . Diflucan [Fluconazole] Rash         Observations/Objective: Alert, oriented, non toxic in appearance. Clear coherent speech without difficulty. No increased work of breathing visualized.    Assessment and Plan: A week of worsening of symptoms. No known covid contacts and is not working. Deferred testing at this time with recommendations for isolation until improvement. Course of antibiotics provided. Return precautions provided. Patient verbalized understanding and agreeable to plan.     Follow Up Instructions:    I discussed the assessment and treatment plan with the patient. The patient was provided an opportunity to ask questions and all were answered. The patient agreed with the plan and demonstrated an understanding of the instructions.   The patient was advised to call back or seek an in-person evaluation if the symptoms worsen or if the condition fails to improve as anticipated.  I provided 11 minutes of non-face-to-face time during this encounter.    Anna Gottron, NP  07/13/2018 12:29 PM         Anna Gottron, NP 07/13/18 1546

## 2018-07-14 ENCOUNTER — Encounter: Payer: Self-pay | Admitting: Family

## 2018-07-14 ENCOUNTER — Telehealth: Payer: Medicaid Other | Admitting: Family

## 2018-07-14 DIAGNOSIS — E889 Metabolic disorder, unspecified: Secondary | ICD-10-CM

## 2018-07-14 NOTE — Progress Notes (Deleted)
   Virtual Visit via telephone Note  I connected with Anna [name] on 07/14/18 at *** by telephone and verified that I am speaking with the correct person using two identifiers. Anna [name] is currently located at *** and *** is currently with her during visit. The provider, Evelina Dun, FNP is located in their office at time of visit.  I discussed the limitations, risks, security and privacy concerns of performing an evaluation and management service by telephone and the availability of in person appointments. I also discussed with the patient that there may be a patient responsible charge related to this service. The patient expressed understanding and agreed to proceed.   History and Present Illness:  HPI    ROS   Observations/Objective: ***  Assessment and Plan: ***  Follow Up Instructions: ***    I discussed the assessment and treatment plan with the patient. The patient was provided an opportunity to ask questions and all were answered. The patient agreed with the plan and demonstrated an understanding of the instructions.   The patient was advised to call back or seek an in-person evaluation if the symptoms worsen or if the condition fails to improve as anticipated.  The above assessment and management plan was discussed with the patient. The patient verbalized understanding of and has agreed to the management plan. Patient is aware to call the clinic if symptoms persist or worsen. Patient is aware when to return to the clinic for a follow-up visit. Patient educated on when it is appropriate to go to the emergency department.   Time call ended:    I provided *** minutes of non-face-to-face time during this encounter.    Evelina Dun, FNP

## 2018-07-14 NOTE — Progress Notes (Signed)
Called patient, she states she was seen at the Urgent Care yesterday and started antibiotics and is starting to feel better. Will cancel appointment.

## 2018-08-09 DIAGNOSIS — I10 Essential (primary) hypertension: Secondary | ICD-10-CM | POA: Diagnosis not present

## 2018-08-09 DIAGNOSIS — Z3201 Encounter for pregnancy test, result positive: Secondary | ICD-10-CM | POA: Diagnosis not present

## 2018-08-13 DIAGNOSIS — O34219 Maternal care for unspecified type scar from previous cesarean delivery: Secondary | ICD-10-CM | POA: Diagnosis not present

## 2018-08-13 DIAGNOSIS — Z3689 Encounter for other specified antenatal screening: Secondary | ICD-10-CM | POA: Diagnosis not present

## 2018-08-13 DIAGNOSIS — O09291 Supervision of pregnancy with other poor reproductive or obstetric history, first trimester: Secondary | ICD-10-CM | POA: Diagnosis not present

## 2018-08-13 DIAGNOSIS — Z3401 Encounter for supervision of normal first pregnancy, first trimester: Secondary | ICD-10-CM | POA: Diagnosis not present

## 2018-08-13 DIAGNOSIS — O3680X0 Pregnancy with inconclusive fetal viability, not applicable or unspecified: Secondary | ICD-10-CM | POA: Diagnosis not present

## 2018-08-13 DIAGNOSIS — Z0389 Encounter for observation for other suspected diseases and conditions ruled out: Secondary | ICD-10-CM | POA: Diagnosis not present

## 2018-08-18 DIAGNOSIS — O09291 Supervision of pregnancy with other poor reproductive or obstetric history, first trimester: Secondary | ICD-10-CM | POA: Diagnosis not present

## 2018-08-18 DIAGNOSIS — O34219 Maternal care for unspecified type scar from previous cesarean delivery: Secondary | ICD-10-CM | POA: Diagnosis not present

## 2018-08-18 DIAGNOSIS — Z3689 Encounter for other specified antenatal screening: Secondary | ICD-10-CM | POA: Diagnosis not present

## 2018-08-18 DIAGNOSIS — O3680X0 Pregnancy with inconclusive fetal viability, not applicable or unspecified: Secondary | ICD-10-CM | POA: Diagnosis not present

## 2018-08-18 DIAGNOSIS — Z3A08 8 weeks gestation of pregnancy: Secondary | ICD-10-CM | POA: Diagnosis not present

## 2018-08-18 DIAGNOSIS — Z3A01 Less than 8 weeks gestation of pregnancy: Secondary | ICD-10-CM | POA: Diagnosis not present

## 2018-08-21 DIAGNOSIS — Z3A01 Less than 8 weeks gestation of pregnancy: Secondary | ICD-10-CM | POA: Diagnosis not present

## 2018-08-21 DIAGNOSIS — O200 Threatened abortion: Secondary | ICD-10-CM | POA: Diagnosis not present

## 2018-08-21 DIAGNOSIS — O10911 Unspecified pre-existing hypertension complicating pregnancy, first trimester: Secondary | ICD-10-CM | POA: Diagnosis not present

## 2018-08-22 DIAGNOSIS — O200 Threatened abortion: Secondary | ICD-10-CM | POA: Diagnosis not present

## 2018-08-22 DIAGNOSIS — O209 Hemorrhage in early pregnancy, unspecified: Secondary | ICD-10-CM | POA: Diagnosis not present

## 2018-08-22 DIAGNOSIS — Z3A00 Weeks of gestation of pregnancy not specified: Secondary | ICD-10-CM | POA: Diagnosis not present

## 2018-08-22 DIAGNOSIS — Z3A01 Less than 8 weeks gestation of pregnancy: Secondary | ICD-10-CM | POA: Diagnosis not present

## 2018-10-23 ENCOUNTER — Telehealth: Payer: Medicaid Other | Admitting: Physician Assistant

## 2018-10-23 DIAGNOSIS — R0981 Nasal congestion: Secondary | ICD-10-CM

## 2018-10-23 DIAGNOSIS — J029 Acute pharyngitis, unspecified: Secondary | ICD-10-CM | POA: Diagnosis not present

## 2018-10-23 MED ORDER — FLUTICASONE PROPIONATE 50 MCG/ACT NA SUSP: 2.0000 | Freq: Every day | NASAL | 0 refills | Status: DC

## 2018-10-23 NOTE — Progress Notes (Signed)
We are sorry you are not feeling well.  Here is how we plan to help!  Based on what you have shared with me, it looks like you may have a viral upper respiratory infection.  Upper respiratory infections are caused by a large number of viruses; however, rhinovirus is the most common cause.   Symptoms vary from person to person, with common symptoms including sore throat, cough, fatigue or lack of energy and feeling of general discomfort.  A low-grade fever of up to 100.4 may present, but is often uncommon.  Symptoms vary however, and are closely related to a person's age or underlying illnesses.  The most common symptoms associated with an upper respiratory infection are nasal discharge or congestion, cough, sneezing, headache and pressure in the ears and face.  These symptoms usually persist for about 3 to 10 days, but can last up to 2 weeks.  It is important to know that upper respiratory infections do not cause serious illness or complications in most cases.    Upper respiratory infections can be transmitted from person to person, with the most common method of transmission being a person's hands.  The virus is able to live on the skin and can infect other persons for up to 2 hours after direct contact.  Also, these can be transmitted when someone coughs or sneezes; thus, it is important to cover the mouth to reduce this risk.  To keep the spread of the illness at [place], good hand hygiene is very important.  This is an infection that is most likely caused by a virus. There are no specific treatments other than to help you with the symptoms until the infection runs its course.  We are sorry you are not feeling well.  Here is how we plan to help!   For nasal congestion, you may use an oral decongestants such as Mucinex D or if you have glaucoma or high blood pressure use plain Mucinex.  Saline nasal spray or nasal drops can help and can safely be used as often as needed for congestion.  For your congestion,  I have prescribed Fluticasone nasal spray one spray in each nostril twice a day  If you do not have a history of heart disease, hypertension, diabetes or thyroid disease, prostate/bladder issues or glaucoma, you may also use Sudafed to treat nasal congestion.  It is highly recommended that you consult with a pharmacist or your primary care physician to ensure this medication is safe for you to take.     If you have a sore or scratchy throat, use a saltwater gargle-  to  teaspoon of salt dissolved in a 4-ounce to 8-ounce glass of warm water.  Gargle the solution for approximately 15-30 seconds and then spit.  It is important not to swallow the solution.  You can also use throat lozenges/cough drops and Chloraseptic spray to help with throat pain or discomfort.  Warm or cold liquids can also be helpful in relieving throat pain.  For headache, pain or general discomfort, you can use Ibuprofen or Tylenol as directed.   Some authorities believe that zinc sprays or the use of Echinacea may shorten the course of your symptoms.   HOME CARE . Only take medications as instructed by your medical team. . Be sure to drink plenty of fluids. Water is fine as well as fruit juices, sodas and electrolyte beverages. You may want to stay away from caffeine or alcohol. If you are nauseated, try taking small sips of  liquids. How do you know if you are getting enough fluid? Your urine should be a pale yellow or almost colorless. . Get rest. . Taking a steamy shower or using a humidifier may help nasal congestion and ease sore throat pain. You can place a towel over your head and breathe in the steam from hot water coming from a faucet. . Using a saline nasal spray works much the same way. . Cough drops, hard candies and sore throat lozenges may ease your cough. . Avoid close contacts especially the very young and the elderly . Cover your mouth if you cough or sneeze . Always remember to wash your hands.   GET HELP  RIGHT AWAY IF: . You develop a fever. . If your symptoms do not improve within then next 3-5 days you will need to have a face to face evaluation. . You develop yellow or green discharge from your nose over 3 days. . You have coughing fits . You develop a severe head ache or visual changes. . You develop shortness of breath, difficulty breathing or start having chest pain . Your symptoms persist after you have completed your treatment plan  MAKE SURE YOU   Understand these instructions.  Will watch your condition.  Will get help right away if you are not doing well or get worse.  Your e-visit answers were reviewed by a board certified advanced clinical practitioner to complete your personal care plan. Depending upon the condition, your plan could have included both over the counter or prescription medications. Please review your pharmacy choice. If there is a problem, you may call our nursing hot line at and have the prescription routed to another pharmacy. Your safety is important to [place]. If you have drug allergies check your prescription carefully.   You can use MyChart to ask questions about today's visit, request a non-urgent call back, or ask for a work or school excuse for 24 hours related to this e-Visit. If it has been greater than 24 hours you will need to follow up with your provider, or enter a new e-Visit to address those concerns. You will get an e-mail in the next two days asking about your experience.  I hope that your e-visit has been valuable and will speed your recovery. Thank you for using e-visits.  Approximately 5 minutes was spent documenting and reviewing patient's chart.

## 2018-11-27 DIAGNOSIS — M545 Low back pain: Secondary | ICD-10-CM | POA: Diagnosis not present

## 2018-11-27 DIAGNOSIS — M549 Dorsalgia, unspecified: Secondary | ICD-10-CM | POA: Diagnosis not present

## 2018-11-27 DIAGNOSIS — I10 Essential (primary) hypertension: Secondary | ICD-10-CM | POA: Diagnosis not present

## 2019-01-26 ENCOUNTER — Other Ambulatory Visit: Payer: Self-pay

## 2019-01-26 ENCOUNTER — Encounter: Payer: Self-pay | Admitting: *Deleted

## 2019-01-26 ENCOUNTER — Ambulatory Visit (INDEPENDENT_AMBULATORY_CARE_PROVIDER_SITE_OTHER): Payer: Medicaid Other | Admitting: *Deleted

## 2019-01-26 ENCOUNTER — Other Ambulatory Visit: Payer: Medicaid Other

## 2019-01-26 DIAGNOSIS — Z3202 Encounter for pregnancy test, result negative: Secondary | ICD-10-CM

## 2019-01-26 DIAGNOSIS — N926 Irregular menstruation, unspecified: Secondary | ICD-10-CM

## 2019-01-26 LAB — POCT URINE PREGNANCY: Preg Test, Ur: NEGATIVE

## 2019-01-26 NOTE — Progress Notes (Addendum)
   NURSE VISIT- PREGNANCY CONFIRMATION   SUBJECTIVE:  Anna [name] is a 23 y.o. G37P1001 female at Unknown by uncertain LMP of No LMP recorded. Patient is pregnant. Here for pregnancy confirmation.  Home pregnancy test: positive x 4  She reports nausea.  She is not taking prenatal vitamins.    OBJECTIVE:  Breastfeeding No   Appears well, in no apparent distress OB History  Gravida Para Term Preterm AB Living  2 1 1     1   SAB TAB Ectopic Multiple Live Births          1    # Outcome Date GA Lbr Len/2nd Weight Sex Delivery Anes PTL Lv  2 Current           1 Term 09/29/17 [redacted]w[redacted]d  7 lb 10 oz (3.459 kg) F CS-LTranv   LIV    Results for orders placed or performed in visit on 01/26/19 (from the past 24 hour(s))  POCT urine pregnancy   Collection Time: 01/26/19  3:49 PM  Result Value Ref Range   Preg Test, Ur Negative Negative    ASSESSMENT: Negative pregnancy test    PLAN: HCG today Prenatal vitamins: plans to begin OTC ASAP   Nausea medicines: not currently needed   OB packet given: No  Alice Rieger  01/26/2019 3:50 PM  Chart reviewed for nurse visit. Agree with plan of care.  Roma Schanz, [place] 01/26/2019 4:05 PM

## 2019-01-27 LAB — BETA HCG QUANT (REF LAB): hCG Quant: <1 m[IU]/mL

## 2019-02-03 ENCOUNTER — Ambulatory Visit: Payer: Medicaid Other | Admitting: Advanced Practice Midwife

## 2019-06-12 ENCOUNTER — Telehealth: Payer: Medicaid Other | Admitting: Physician Assistant

## 2019-06-12 ENCOUNTER — Ambulatory Visit (INDEPENDENT_AMBULATORY_CARE_PROVIDER_SITE_OTHER)
Admission: RE | Admit: 2019-06-12 | Discharge: 2019-06-12 | Disposition: A | Discharge status: Home / Self Care | Payer: Medicaid Other | Source: Ambulatory Visit

## 2019-06-12 DIAGNOSIS — N946 Dysmenorrhea, unspecified: Secondary | ICD-10-CM | POA: Diagnosis not present

## 2019-06-12 MED ORDER — NAPROXEN 500 MG PO TABS: 500.0000 mg | ORAL_TABLET | Freq: Two times a day (BID) | ORAL | 0 refills | Status: DC

## 2019-06-12 MED ORDER — ONDANSETRON 4 MG PO TBDP: 4.0000 mg | ORAL_TABLET | Freq: Three times a day (TID) | ORAL | 0 refills | Status: DC | PRN

## 2019-06-12 NOTE — ED Provider Notes (Signed)
Virtual Visit via Video Note:  [name]  initiated request for Telemedicine visit with [facility] team. I connected with Elwin Sleight  on 06/12/2019 at 2:56 PM  for a synchronized telemedicine visit using a video enabled HIPPA compliant telemedicine application. I verified that I am speaking with Elwin Sleight  using two identifiers. Jissel Slavens C Yehya Brendle, PA-C  was physically located in a [facility] Urgent care site and [name] was located at a different location.   The limitations of evaluation and management by telemedicine as well as the availability of in-person appointments were discussed. Patient was informed that she  may incur a bill ( including co-pay) for this virtual visit encounter. Raliyah S Rajewski  expressed understanding and gave verbal consent to proceed with virtual visit.     History of Present Illness:Anna [name]  is a 24 y.o. female presents for evaluation of menstrual cramping.  Patient notes that her menstrual cycle began this morning.  She has had a lot of body aches associated with this along with abdominal cramping.  She also reports associated nausea, denies vomiting.  Had normal menstrual cycle in April around 4/30, but was shorter than normal.  Typically will have some cramping, but symptoms worse this menstrual cycle.  She has used ibuprofen 600 mg without relief.  Denies associated URI symptoms.  Denies UTI symptoms, denies vaginal discharge.   Allergies  Allergen Reactions  . Diflucan [Fluconazole] Rash     Past Medical History:  Diagnosis Date  . Allergy   . GERD (gastroesophageal reflux disease)   . Hypertension   . Morbid obesity with BMI of 70 and over, adult (HCC) 09/29/2017   with hypertension      Social History   Tobacco Use  . Smoking status: Never Smoker  . Smokeless tobacco: Never Used  Substance Use Topics  . Alcohol use: No  . Drug use: No    Comment: THC positive noted in chart 03/21/2017         Observations/Objective: Physical Exam  Constitutional: She is oriented to person, place, and time and well-developed, well-nourished, and in no distress. No distress.  HENT:  Head: Normocephalic and atraumatic.  Pulmonary/Chest: Effort normal. No respiratory distress.  Speaking in full sentences  Musculoskeletal:     Comments: Ambulating around house  Neurological: She is alert and oriented to person, place, and time.  Speech clear, face symmetric     Assessment and Plan:  Dysmenorrhea  Treating for dysmenorrhea, denies other pelvic symptoms, provided Naprosyn as alternative to ibuprofen.  Zofran for nausea.  Rest and fluids.  Follow-up in person if not improving or worsening.    Follow Up Instructions:     I discussed the assessment and treatment plan with the patient. The patient was provided an opportunity to ask questions and all were answered. The patient agreed with the plan and demonstrated an understanding of the instructions.   The patient was advised to call back or seek an in-person evaluation if the symptoms worsen or if the condition fails to improve as anticipated.      Lew Dawes, PA-C  06/12/2019 2:56 PM         Lew Dawes, PA-C 06/12/19 1548

## 2019-06-12 NOTE — Progress Notes (Signed)
Hi Ernestyne,   I see you have been taken care of via Urgent Care video visit.  Please let us know if you need anything in the future.      NOTE: If you entered your credit card information for this eVisit, you will not be charged. You may see a "hold" on your card for the $35 but that hold will drop off and you will not have a charge processed.   If you are having a true medical emergency please call 911.      For an urgent face to face visit, [place] has five urgent care centers for your convenience:      NEW:  [facility] at [facility] Directions [phone] [address] [place], [place] [address] . 10 am - 6pm Monday - Friday    [facility]) Get Driving Directions [phone] [address] [place], [place] [address] . 10 am to 8 pm Monday-Friday . 12 pm to 8 pm [facility] Urgent Care at [facility] Get Driving Directions [phone] [address], [address] [place], [place] [address] . 8 am to 8 pm Monday-Friday . 9 am to 6 pm Saturday . 11 am to 6 pm Sunday     [facility] at [facility] Get Driving Directions  [phone] [address] [place], [place] [address] . 8 am to 8 pm Monday-Friday . 8 am to 4 pm [facility] Urgent Care at [facility] Directions [phone] [address] [place], [place] [address] . 12 pm to 6 pm Monday-Friday      Your e-visit answers were reviewed by a board certified advanced clinical practitioner to complete your personal care plan.  Thank you for using e-Visits.

## 2019-07-14 DIAGNOSIS — Z23 Encounter for immunization: Secondary | ICD-10-CM | POA: Diagnosis not present

## 2019-07-17 ENCOUNTER — Encounter: Payer: Medicaid Other | Admitting: Family Medicine

## 2019-07-21 ENCOUNTER — Encounter: Payer: Self-pay | Admitting: Family Medicine

## 2019-08-03 ENCOUNTER — Ambulatory Visit (INDEPENDENT_AMBULATORY_CARE_PROVIDER_SITE_OTHER)
Admission: RE | Admit: 2019-08-03 | Discharge: 2019-08-03 | Disposition: A | Discharge status: Home / Self Care | Payer: Medicaid Other | Source: Ambulatory Visit

## 2019-08-03 DIAGNOSIS — J069 Acute upper respiratory infection, unspecified: Secondary | ICD-10-CM

## 2019-08-03 NOTE — ED Provider Notes (Signed)
Virtual Visit via Video Note:  [name]  initiated request for Telemedicine visit with [facility] Urgent [facility] team. I connected with Elwin Sleight  on 08/03/2019 at 1:09 PM  for a synchronized telemedicine visit using a video enabled HIPPA compliant telemedicine application. I verified that I am speaking with Elwin Sleight  using two identifiers. Mickie Bail, NP  was physically located in a [facility] Urgent care site and ILIZA BLANKENBECKLER was located at a different location.   The limitations of evaluation and management by telemedicine as well as the availability of in-person appointments were discussed. Patient was informed that she  may incur a bill ( including co-pay) for this virtual visit encounter. Krishauna S Blattner  expressed understanding and gave verbal consent to proceed with virtual visit.     History of Present Illness:Anna [name]  is a 24 y.o. female presents for evaluation of congestion, runny nose, sore throat, postnasal drip, nonproductive cough x 2 days.   Rapid COVID test at work negative yesterday.  She denies fever, chills, difficulty swallowing, shortness of breath, vomiting, diarrhea, rash, or other symptoms.  Treatment attempted with OTC cold medicine.  She denies current pregnancy or breastfeeding.      Allergies  Allergen Reactions  . Diflucan [Fluconazole] Rash     Past Medical History:  Diagnosis Date  . Allergy   . GERD (gastroesophageal reflux disease)   . Hypertension   . Morbid obesity with BMI of 70 and over, adult (HCC) 09/29/2017   with hypertension      Social History   Tobacco Use  . Smoking status: Never Smoker  . Smokeless tobacco: Never Used  Vaping Use  . Vaping Use: Never used  Substance Use Topics  . Alcohol use: No  . Drug use: No    Comment: THC positive noted in chart 03/21/2017   ROS: as stated in HPI.  All other systems reviewed and negative.      Observations/Objective: Physical Exam  VITALS:  Patient denies fever. GENERAL: Alert, appears well and in no acute distress. HEENT: Atraumatic. Oral mucosa appears moist. NECK: Normal movements of the head and neck. CARDIOPULMONARY: Occasional cough during exam.  No increased WOB. Speaking in clear sentences. I:E ratio WNL.  MS: Moves all visible extremities without noticeable abnormality. PSYCH: Pleasant and cooperative, well-groomed. Speech normal rate and rhythm. Affect is appropriate. Insight and judgement are appropriate. Attention is focused, linear, and appropriate.  NEURO: CN grossly intact. Oriented as arrived to appointment on time with no prompting. Moves both UE equally.  SKIN: No obvious lesions, wounds, erythema, or cyanosis noted on face or hands.   Assessment and Plan:    ICD-10-CM   1. Viral URI with cough  J06.9        Follow Up Instructions: Work note provided for today.  Encourage patient to continue Tylenol or ibuprofen as needed for fever or discomfort; Mucinex as needed for congestion.  Education provided on viral URI.  Instructed her to follow-up with her PCP or come here to be seen in person if her symptoms are not improving.  Patient agrees to plan of care.    I discussed the assessment and treatment plan with the patient. The patient was provided an opportunity to ask questions and all were answered. The patient agreed with the plan and demonstrated an understanding of the instructions.   The patient was advised to call back or seek an in-person evaluation if the symptoms worsen or  if the condition fails to improve as anticipated.      Mickie Bail, NP  08/03/2019 1:09 PM         Mickie Bail, NP 08/03/19 1310

## 2019-08-03 NOTE — Discharge Instructions (Signed)
Take Tylenol or ibuprofen as needed for fever or discomfort.  Take Mucinex as needed for congestion.    Follow up with your primary care provider or come here to be seen in person if your symptoms are not improving.    

## 2019-08-20 ENCOUNTER — Ambulatory Visit (INDEPENDENT_AMBULATORY_CARE_PROVIDER_SITE_OTHER)
Admission: RE | Admit: 2019-08-20 | Discharge: 2019-08-20 | Disposition: A | Discharge status: Home / Self Care | Payer: Medicaid Other | Source: Ambulatory Visit

## 2019-08-20 DIAGNOSIS — R21 Rash and other nonspecific skin eruption: Secondary | ICD-10-CM | POA: Diagnosis not present

## 2019-08-20 MED ORDER — TRIAMCINOLONE ACETONIDE 0.1 % EX CREA: 1.0000 "application " | TOPICAL_CREAM | Freq: Two times a day (BID) | CUTANEOUS | 0 refills | Status: DC

## 2019-08-20 NOTE — Discharge Instructions (Addendum)
Use the triamcinolone cream as directed.    Follow up with your primary care provider or come here to be seen in person if your symptoms are not improving.

## 2019-08-20 NOTE — ED Provider Notes (Signed)
Virtual Visit via Video Note:  JACORA HOPKINS  initiated request for Telemedicine visit with [facility] Urgent Care team. I connected with Elwin Sleight  on 08/20/2019 at 12:13 PM  for a synchronized telemedicine visit using a video enabled HIPPA compliant telemedicine application. I verified that I am speaking with Elwin Sleight  using two identifiers. Mickie Bail, NP  was physically located in a [facility] Urgent care site and CALAIS SVEHLA was located at a different location.   The limitations of evaluation and management by telemedicine as well as the availability of in-person appointments were discussed. Patient was informed that she  may incur a bill ( including co-pay) for this virtual visit encounter. Tayonna S Rozycki  expressed understanding and gave verbal consent to proceed with virtual visit.     History of Present Illness:Anna [name]  is a 24 y.o. female presents for evaluation of rash on her right forearm x 4-5 days.  The rash is mildly pruritic "white spots", non-tender, typical of previous eczema rashes.  Treatment attempted at home with Aquaphor cream.  She reports history of Eczema.  She denies fever, chills, sore throat, cough, shortness of breath, vomiting, diarrhea, or other symptoms.  She denies current pregnancy or breastfeeding.      Allergies  Allergen Reactions  . Diflucan [Fluconazole] Rash     Past Medical History:  Diagnosis Date  . Allergy   . GERD (gastroesophageal reflux disease)   . Hypertension   . Morbid obesity with BMI of 70 and over, adult (HCC) 09/29/2017   with hypertension      Social History   Tobacco Use  . Smoking status: Never Smoker  . Smokeless tobacco: Never Used  Vaping Use  . Vaping Use: Never used  Substance Use Topics  . Alcohol use: No  . Drug use: No    Comment: THC positive noted in chart 03/21/2017    ROS: as stated in HPI.  All other systems reviewed and negative.       Observations/Objective: Physical Exam  VITALS: Patient denies fever. GENERAL: Alert, appears well and in no acute distress. HEENT: Atraumatic. Oral mucosa appears moist. NECK: Normal movements of the head and neck. CARDIOPULMONARY: No increased WOB. Speaking in clear sentences. I:E ratio WNL.  MS: Moves all visible extremities without noticeable abnormality. PSYCH: Pleasant and cooperative, well-groomed. Speech normal rate and rhythm. Affect is appropriate. Insight and judgement are appropriate. Attention is focused, linear, and appropriate.  NEURO: CN grossly intact. Oriented as arrived to appointment on time with no prompting. Moves both UE equally.  SKIN: Hypopigmented patches on right forearm.     Assessment and Plan:    ICD-10-CM   1. Rash  R21        Follow Up Instructions: Treating with triamcinolone cream.  Instructed patient to follow-up with her PCP or dermatologist or come here to be seen in person if her symptoms are not improving.  Patient agrees to plan of care.      I discussed the assessment and treatment plan with the patient. The patient was provided an opportunity to ask questions and all were answered. The patient agreed with the plan and demonstrated an understanding of the instructions.   The patient was advised to call back or seek an in-person evaluation if the symptoms worsen or if the condition fails to improve as anticipated.      Mickie Bail, NP  08/20/2019 12:13 PM  Mickie Bail, NP 08/20/19 1213

## 2019-09-21 ENCOUNTER — Ambulatory Visit: Payer: Medicaid Other | Admitting: Women's Health

## 2020-01-13 IMAGING — US US MFM FETAL BPP W/O NON-STRESS
1 series · 10 of 10 positions shown · non-contrast
Comparison: none

[Series 1: us mfm fetal bpp w/o non-stress · 10 acquisitions, 10 frames shown]
[im 1/10]
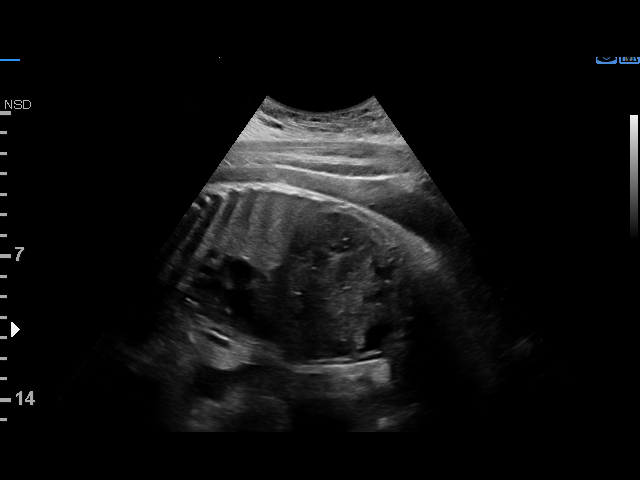
[im 2/10]
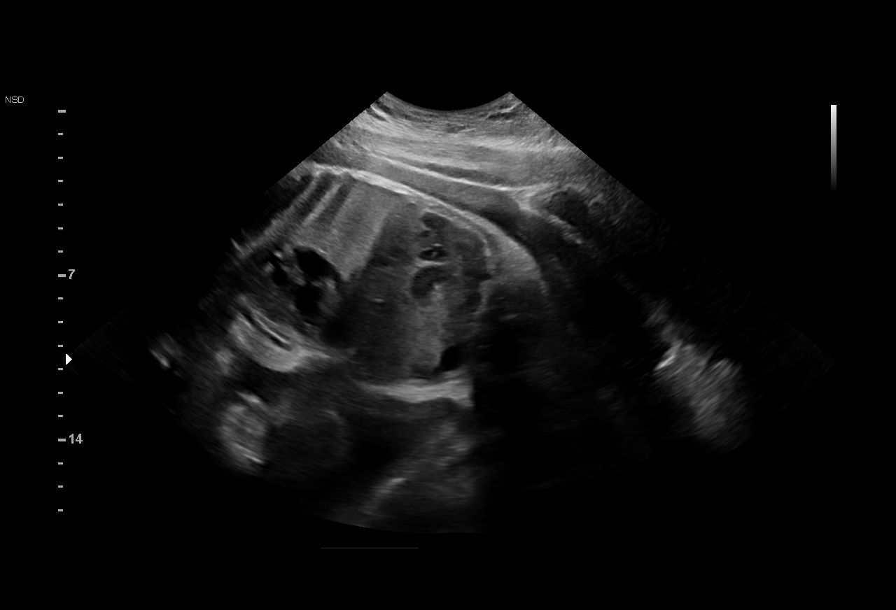
[im 3/10]
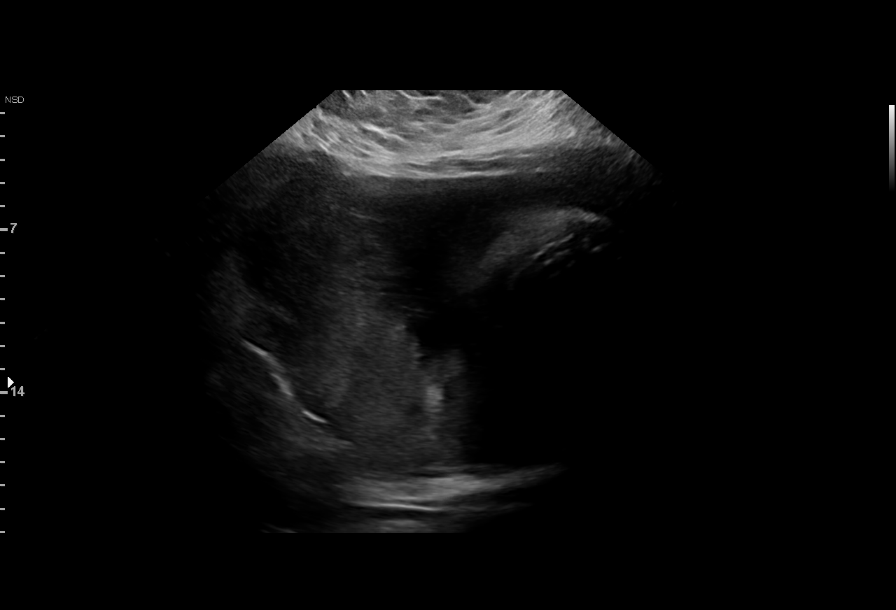
[im 4/10]
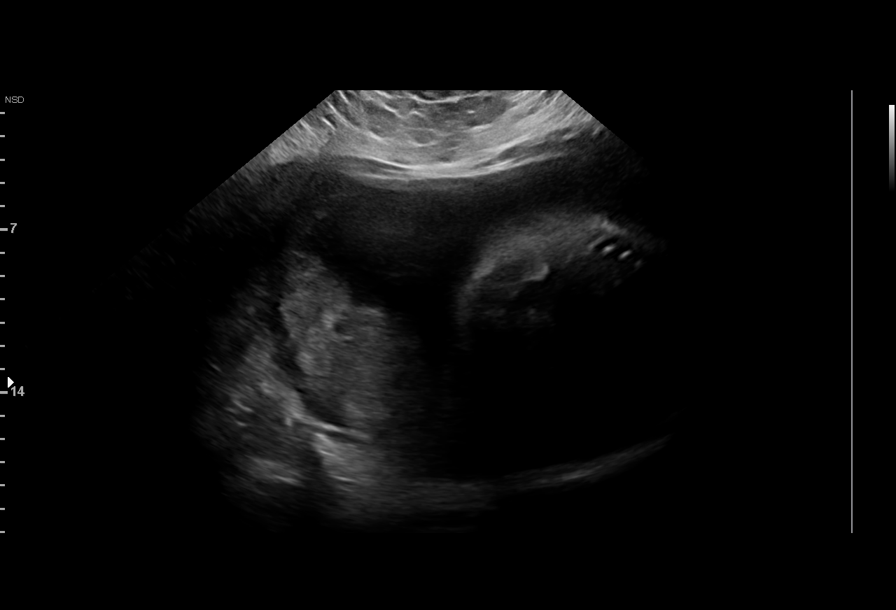
[im 5/10]
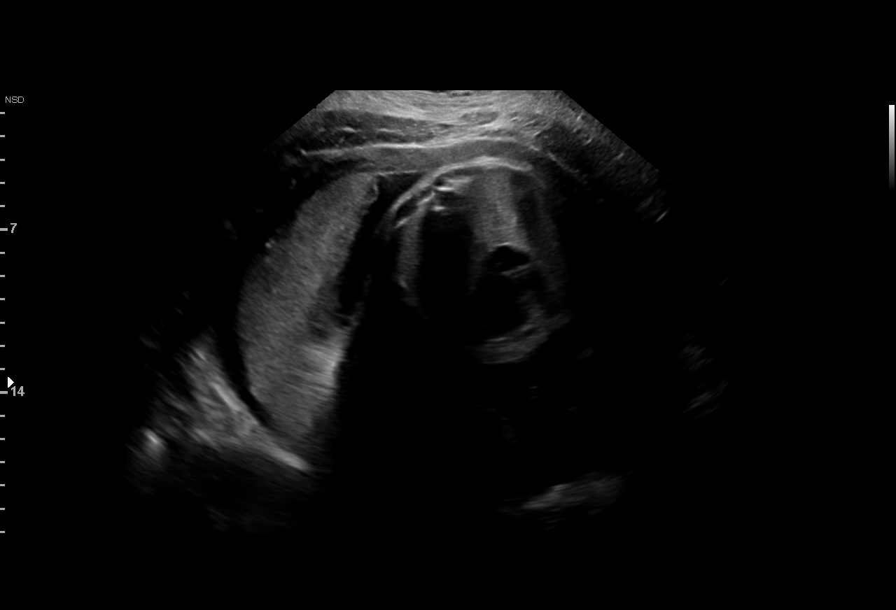
[im 6/10]
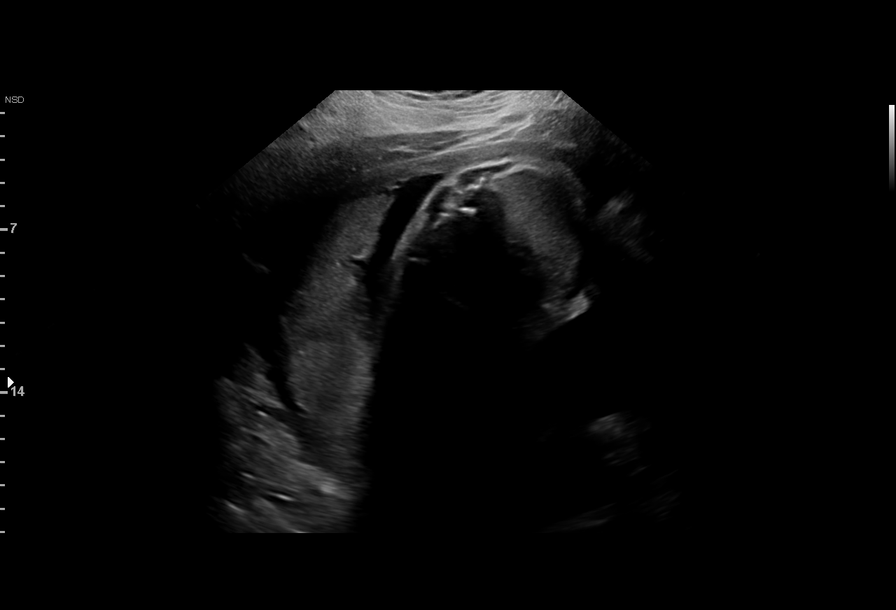
[im 7/10]
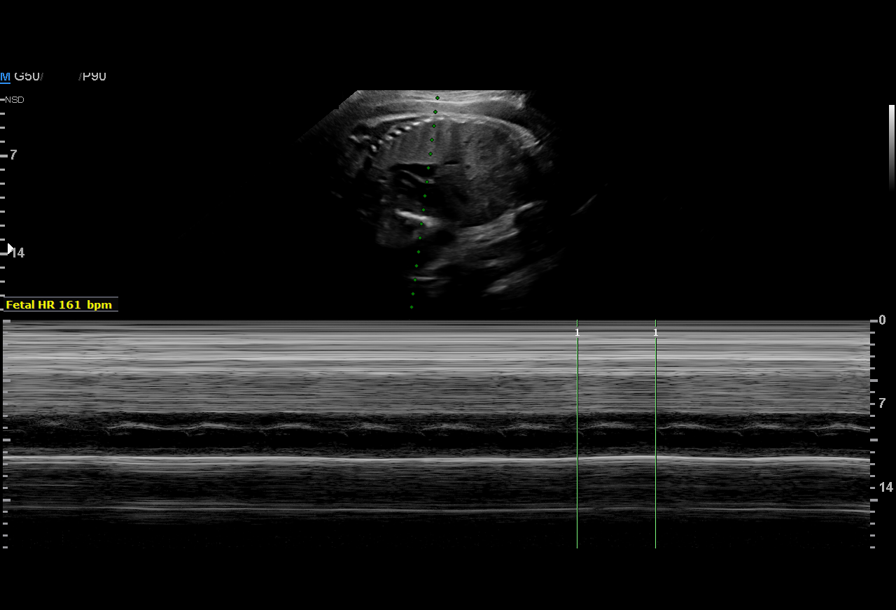
[im 8/10]
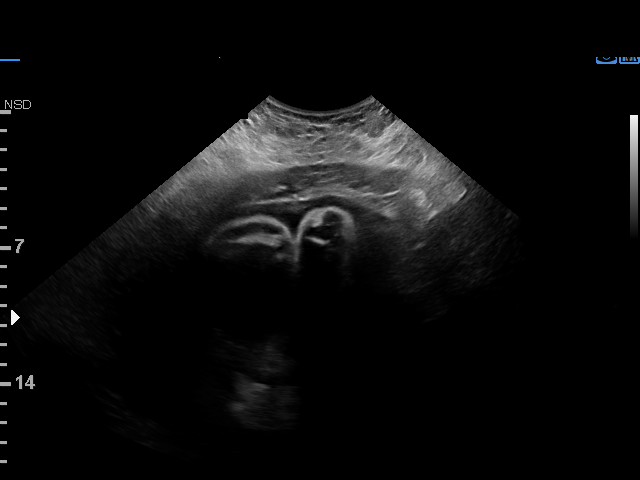
[im 9/10]
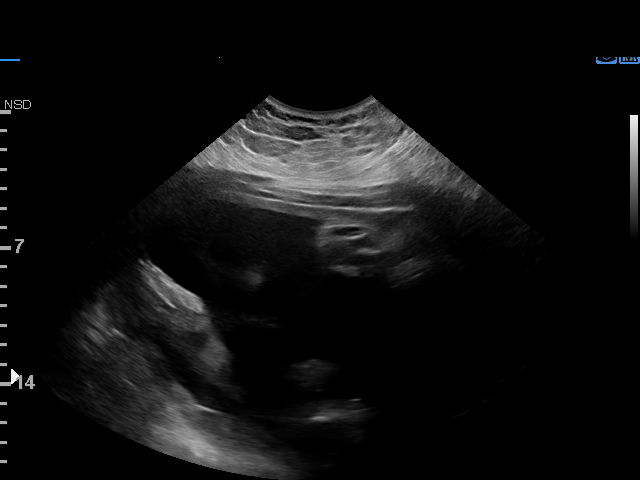
[im 10/10]
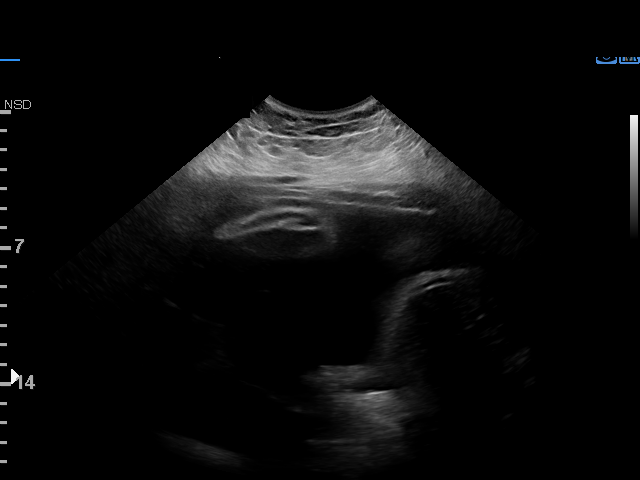

[10 of 10 positions shown; findings below may reference images not displayed]

Attending:        Rdwan Pass        Secondary Phy.:   [facility]-
MAU/Triage

Indications

30 weeks gestation of pregnancy
Decreased fetal movements, third trimester,
unspecified
Maternal morbid obesity (385 lbs)
OB History

Gravidity:    1         Term:   0        Prem:   0        SAB:   0
TOP:          0       Ectopic:  0        Living: 0
Fetal Evaluation

Num Of Fetuses:     1
Fetal Heart         161
Rate(bpm):
Cardiac Activity:   Observed
Presentation:       Breech
Placenta:           Posterior

Amniotic Fluid
AFI FV:      Subjectively within normal limits

AFI Sum(cm)     %Tile       Largest Pocket(cm)
20.5            80

RUQ(cm)       RLQ(cm)       LUQ(cm)        LLQ(cm)
8.62
Biophysical Evaluation
Amniotic F.V:   Within normal limits       F. Tone:        Observed
F. Movement:    Observed                   Score:          [DATE]
F. Breathing:   Observed
Gestational Age

Best:          30w 4d     Det. By:  Previous Ultrasound      EDD:   10/17/17
Impression

Patient was evaluated for suspected preterm labor and
decreased fetal movements.

Amniotic fluid is normal and good fetal activity is seen.
BPP [DATE].
This study was remotely read.
Recommendations

Correlate clinically.

## 2020-03-24 ENCOUNTER — Other Ambulatory Visit: Payer: Self-pay

## 2020-03-24 ENCOUNTER — Encounter: Payer: Self-pay | Admitting: Nurse Practitioner

## 2020-03-24 ENCOUNTER — Ambulatory Visit: Payer: Medicaid Other | Admitting: Nurse Practitioner

## 2020-03-24 DIAGNOSIS — Z8679 Personal history of other diseases of the circulatory system: Secondary | ICD-10-CM | POA: Diagnosis not present

## 2020-03-24 NOTE — Assessment & Plan Note (Signed)
Patient's current weight 403 pounds / 3.2 ounces.  BMI 73.75 kg/mm.  Referral to [facility] completed. Education provided for weight loss, healthy diet, exercise and calorie counting.

## 2020-03-24 NOTE — Assessment & Plan Note (Signed)
Symptoms not well controlled, elevated Blood pressure reading in clinic, patient is not currently on any blood pressure medication. Provided education to reduce sodium in diet, weight loss, and follow up in 3 months. .  Education material printed out and given to patient.

## 2020-03-24 NOTE — Patient Instructions (Signed)
Calorie Counting for Weight Loss Calories are units of energy. Your body needs a certain number of calories from food to keep going throughout the day. When you eat or drink more calories than your body needs, your body stores the extra calories mostly as fat. When you eat or drink fewer calories than your body needs, your body burns fat to get the energy it needs. Calorie counting means keeping track of how many calories you eat and drink each day. Calorie counting can be helpful if you need to lose weight. If you eat fewer calories than your body needs, you should lose weight. Ask your health care provider what a healthy weight is for you. For calorie counting to work, you will need to eat the right number of calories each day to lose a healthy amount of weight per week. A dietitian can help you figure out how many calories you need in a day and will suggest ways to reach your calorie goal.  A healthy amount of weight to lose each week is usually 1-2 lb (0.5-0.9 kg). This usually means that your daily calorie intake should be reduced by 500-750 calories.  Eating 1,200-1,500 calories a day can help most women lose weight.  Eating 1,500-1,800 calories a day can help most men lose weight. What do I need to know about calorie counting? Work with your health care provider or dietitian to determine how many calories you should get each day. To meet your daily calorie goal, you will need to:  Find out how many calories are in each food that you would like to eat. Try to do this before you eat.  Decide how much of the food you plan to eat.  Keep a food log. Do this by writing down what you ate and how many calories it had. To successfully lose weight, it is important to balance calorie counting with a healthy lifestyle that includes regular activity. Where do I find calorie information? The number of calories in a food can be found on a Nutrition Facts label. If a food does not have a Nutrition  Facts label, try to look up the calories online or ask your dietitian for help. Remember that calories are listed per serving. If you choose to have more than one serving of a food, you will have to multiply the calories per serving by the number of servings you plan to eat. For example, the label on a package of bread might say that a serving size is 1 slice and that there are 90 calories in a serving. If you eat 1 slice, you will have eaten 90 calories. If you eat 2 slices, you will have eaten 180 calories.   How do I keep a food log? After each time that you eat, record the following in your food log as soon as possible:  What you ate. Be sure to include toppings, sauces, and other extras on the food.  How much you ate. This can be measured in cups, ounces, or number of items.  How many calories were in each food and drink.  The total number of calories in the food you ate. Keep your food log near you, such as in a pocket-sized notebook or on an app or website on your mobile phone. Some programs will calculate calories for you and show you how many calories you have left to meet your daily goal. What are some portion-control tips?  Know how many calories are in a serving.  This will help you know how many servings you can have of a certain food.  Use a measuring cup to measure serving sizes. You could also try weighing out portions on a kitchen scale. With time, you will be able to estimate serving sizes for some foods.  Take time to put servings of different foods on your favorite plates or in your favorite bowls and cups so you know what a serving looks like.  Try not to eat straight from a food's packaging, such as from a bag or box. Eating straight from the package makes it hard to see how much you are eating and can lead to overeating. Put the amount you would like to eat in a cup or on a plate to make sure you are eating the right portion.  Use smaller plates, glasses, and bowls for  smaller portions and to prevent overeating.  Try not to multitask. For example, avoid watching TV or using your computer while eating. If it is time to eat, sit down at a table and enjoy your food. This will help you recognize when you are full. It will also help you be more mindful of what and how much you are eating. What are tips for following this plan? Reading food labels  Check the calorie count compared with the serving size. The serving size may be smaller than what you are used to eating.  Check the source of the calories. Try to choose foods that are high in protein, fiber, and vitamins, and low in saturated fat, trans fat, and sodium. Shopping  Read nutrition labels while you shop. This will help you make healthy decisions about which foods to buy.  Pay attention to nutrition labels for low-fat or fat-free foods. These foods sometimes have the same number of calories or more calories than the full-fat versions. They also often have added sugar, starch, or salt to make up for flavor that was removed with the fat.  Make a grocery list of lower-calorie foods and stick to it. Cooking  Try to cook your favorite foods in a healthier way. For example, try baking instead of frying.  Use low-fat dairy products. Meal planning  Use more fruits and vegetables. One-half of your plate should be fruits and vegetables.  Include lean proteins, such as chicken, [place], and fish. Lifestyle Each week, aim to do one of the following:  150 minutes of moderate exercise, such as walking.  75 minutes of vigorous exercise, such as running. General information  Know how many calories are in the foods you eat most often. This will help you calculate calorie counts faster.  Find a way of tracking calories that works for you. Get creative. Try different apps or programs if writing down calories does not work for you. What foods should I eat?  Eat nutritious foods. It is better to have a  nutritious, high-calorie food, such as an avocado, than a food with few nutrients, such as a bag of potato chips.  Use your calories on foods and drinks that will fill you up and will not leave you hungry soon after eating. ? Examples of foods that fill you up are nuts and nut butters, vegetables, lean proteins, and high-fiber foods such as whole grains. High-fiber foods are foods with more than 5 g of fiber per serving.  Pay attention to calories in drinks. Low-calorie drinks include water and unsweetened drinks. The items listed above may not be a complete list of foods and beverages you  can eat. Contact a dietitian for more information.   What foods should I limit? Limit foods or drinks that are not good sources of vitamins, minerals, or protein or that are high in unhealthy fats. These include:  Candy.  Other sweets.  Sodas, specialty coffee drinks, alcohol, and juice. The items listed above may not be a complete list of foods and beverages you should avoid. Contact a dietitian for more information. How do I count calories when eating out?  Pay attention to portions. Often, portions are much larger when eating out. Try these tips to keep portions smaller: ? Consider sharing a meal instead of getting your own. ? If you get your own meal, eat only half of it. Before you start eating, ask for a container and put half of your meal into it. ? When available, consider ordering smaller portions from the menu instead of full portions.  Pay attention to your food and drink choices. Knowing the way food is cooked and what is included with the meal can help you eat fewer calories. ? If calories are listed on the menu, choose the lower-calorie options. ? Choose dishes that include vegetables, fruits, whole grains, low-fat dairy products, and lean proteins. ? Choose items that are boiled, broiled, grilled, or steamed. Avoid items that are buttered, battered, fried, or served with cream sauce. Items  labeled as crispy are usually fried, unless stated otherwise. ? Choose water, low-fat milk, unsweetened iced tea, or other drinks without added sugar. If you want an alcoholic beverage, choose a lower-calorie option, such as a glass of wine or light beer. ? Ask for dressings, sauces, and syrups on the side. These are usually high in calories, so you should limit the amount you eat. ? If you want a salad, choose a garden salad and ask for grilled meats. Avoid extra toppings such as bacon, cheese, or fried items. Ask for the dressing on the side, or ask for olive oil and vinegar or lemon to use as dressing.  Estimate how many servings of a food you are given. Knowing serving sizes will help you be aware of how much food you are eating at restaurants. Where to find more information  Centers for Disease Control and Prevention: FootballExhibition.com.br  [place] Department of Agriculture: WrestlingReporter.dk Summary  Calorie counting means keeping track of how many calories you eat and drink each day. If you eat fewer calories than your body needs, you should lose weight.  A healthy amount of weight to lose per week is usually 1-2 lb (0.5-0.9 kg). This usually means reducing your daily calorie intake by 500-750 calories.  The number of calories in a food can be found on a Nutrition Facts label. If a food does not have a Nutrition Facts label, try to look up the calories online or ask your dietitian for help.  Use smaller plates, glasses, and bowls for smaller portions and to prevent overeating.  Use your calories on foods and drinks that will fill you up and not leave you hungry shortly after a meal. This information is not intended to replace advice given to you by your health care provider. Make sure you discuss any questions you have with your health care provider. Document Revised: 02/26/2019 Document Reviewed: 02/26/2019 Elsevier Patient Education  2021 Elsevier Inc. Blood Pressure Record Sheet To take your blood  pressure, you will need a blood pressure machine. You can buy a blood pressure machine (blood pressure monitor) at your clinic, drug store, or online. When  choosing one, consider:  An automatic monitor that has an arm cuff.  A cuff that wraps snugly around your upper arm. You should be able to fit only one finger between your arm and the cuff.  A device that stores blood pressure reading results.  Do not choose a monitor that measures your blood pressure from your wrist or finger. Follow your health care provider's instructions for how to take your blood pressure. To use this form:  Get one reading in the morning (a.m.) before you take any medicines.  Get one reading in the evening (p.m.) before supper.  Take at least 2 readings with each blood pressure check. This makes sure the results are correct. Wait 1-2 minutes between measurements.  Write down the results in the spaces on this form.  Repeat this once a week, or as told by your health care provider.  Make a follow-up appointment with your health care provider to discuss the results. Blood pressure log Date: _______________________  a.m. _____________________(1st reading) _____________________(2nd reading)  p.m. _____________________(1st reading) _____________________(2nd reading) Date: _______________________  a.m. _____________________(1st reading) _____________________(2nd reading)  p.m. _____________________(1st reading) _____________________(2nd reading) Date: _______________________  a.m. _____________________(1st reading) _____________________(2nd reading)  p.m. _____________________(1st reading) _____________________(2nd reading) Date: _______________________  a.m. _____________________(1st reading) _____________________(2nd reading)  p.m. _____________________(1st reading) _____________________(2nd reading) Date: _______________________  a.m. _____________________(1st reading) _____________________(2nd  reading)  p.m. _____________________(1st reading) _____________________(2nd reading) This information is not intended to replace advice given to you by your health care provider. Make sure you discuss any questions you have with your health care provider. Document Revised: 05/06/2019 Document Reviewed: 05/06/2019 Elsevier Patient Education  2021 [employer].

## 2020-03-24 NOTE — Progress Notes (Signed)
New Patient Note  RE: Anna [name] MRN: [id number] DOB: January 14, 1996 Date of Office Visit: 03/24/2020  Chief Complaint: Establish Care, Hypertension, and Obesity  History of Present Illness: Patient is a 25 year old female with a past medical history of prediabetes, obesity with a BMI of 73.75 kg per, hypertension, marijuana use, menorrhagia with irregular cycle, preeclampsia and GERD.  Patient is reporting elevated blood pressur, patient is not currently on any blood pressure medication.  Denies any symptom of hypertension she is working on [employer], weight loss and exercise regimen as tolerated.  Concerning patient's GERD no new signs and symptoms.  Patient not on any current medication.  Patient continues to work on [employer], and weight loss.   Assessment and Plan: Anna [name] is a 25 y.o. female with: Obesity, morbid, BMI 50 or higher (HCC) Patient's current weight 403 pounds / 3.2 ounces.  BMI 73.75 kg/mm.  Referral to [facility] completed. Education provided for weight loss, healthy diet, exercise and calorie counting.  History of hypertension in pediatric patient Symptoms not well controlled, elevated Blood pressure reading in clinic, patient is not currently on any blood pressure medication. Provided education to reduce sodium in diet, weight loss, and follow up in 3 months. .  Education material printed out and given to patient.  Return if symptoms worsen or fail to improve, for scedule appointment for CPE.   Diagnostics:   Past Medical History: Patient Active Problem List   Diagnosis Date Noted  . Postoperative wound cellulitis 10/03/2017  . Postoperative anemia 10/03/2017  . Preeclampsia in postpartum period 10/03/2017  . Marijuana use 03/22/2017  . Menorrhagia with regular cycle 12/04/2016  . Pre-diabetes 07/10/2010  . Obesity, morbid, BMI 50 or higher (HCC) 07/10/2010  . History of hypertension in pediatric patient  07/10/2010   Past Medical History:  Diagnosis Date  . Allergy   . GERD (gastroesophageal reflux disease)   . Hypertension   . Morbid obesity with BMI of 70 and over, adult (HCC) 09/29/2017   with hypertension    Past Surgical History: Past Surgical History:  Procedure Laterality Date  . CESAREAN SECTION N/A 09/29/2017   Procedure: CESAREAN SECTION;  Surgeon: Tilda Burrow, MD;  Location: [facility] BIRTHING SUITES;  Service: Obstetrics;  Laterality: N/A;  . MYRINGOTOMY    . TONSILLECTOMY AND ADENOIDECTOMY     Medication List:  No current outpatient medications on file.   No current facility-administered medications for this visit.   Allergies: Allergies  Allergen Reactions  . Diflucan [Fluconazole] Rash   Social History: Social History   Socioeconomic History  . Marital status: Single    Spouse name: Not on file  . Number of children: Not on file  . Years of education: Not on file  . Highest education level: Not on file  Occupational History  . 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 Smoking status: Never Smoker  . Smokeless tobacco: Never Used  Vaping Use  . Vaping Use: Never used  Substance and Sexual Activity  . Alcohol use: No  . Drug use: No    Comment: THC positive noted in chart 03/21/2017  . Sexual activity: Yes    Birth control/protection: None  Other Topics Concern  . Not on file  Social History Narrative  . Not on file   Social Determinants of Health   Financial Resource Strain: Not on file  Food Insecurity: Not on file  Transportation Needs: Not on file  Physical Activity: Not on file  Stress: Not on file  Social Connections: Not on file       Family History: Family History  Problem Relation Age of Onset  . Asthma Mother   . Anemia Mother   . Hypertension Father   . Cancer Maternal Grandfather   . Hypertension Paternal Grandmother   . Heart failure Paternal Grandmother          Review of  Systems  Constitutional: Negative.   HENT: Negative.   Respiratory: Negative.   Cardiovascular: Negative.   Gastrointestinal: Negative.   Genitourinary: Negative.   Musculoskeletal: Negative.   Skin: Negative.   Neurological: Negative.   All other systems reviewed and are negative.  Objective: BP (!) 139/93   Pulse 86   Temp (!) 97.3 F (36.3 C)   Ht 5\' 2"  (1.575 m)   Wt (!) 403 lb 3.2 oz (182.9 kg)   LMP 02/27/2020   SpO2 98%   Breastfeeding Unknown   BMI 73.75 kg/m  Body mass index is 73.75 kg/m. Physical Exam Vitals reviewed.  Constitutional:      Appearance: Normal appearance. She is obese.  HENT:     Nose: Nose normal.  Eyes:     Conjunctiva/sclera: Conjunctivae normal.  Cardiovascular:     Rate and Rhythm: Normal rate and regular rhythm.     Pulses: Normal pulses.     Heart sounds: Normal heart sounds.  Pulmonary:     Effort: Pulmonary effort is normal.     Breath sounds: Normal breath sounds.  Abdominal:     General: Bowel sounds are normal.  Musculoskeletal:        General: Normal range of motion.  Skin:    General: Skin is warm.  Neurological:     Mental Status: She is alert and oriented to person, place, and time.    The plan was reviewed with the patient/family, and all questions/concerned were addressed.  It was my pleasure to see Anna [name] today and participate in her care. Please feel free to contact me with any questions or concerns.  Sincerely,  Marjo Bicker NP [facility]

## 2020-04-13 ENCOUNTER — Encounter: Payer: Medicaid Other | Admitting: Nurse Practitioner

## 2020-04-19 ENCOUNTER — Encounter: Payer: Self-pay | Admitting: Nurse Practitioner

## 2020-06-20 DIAGNOSIS — K820 Obstruction of gallbladder: Secondary | ICD-10-CM | POA: Diagnosis not present

## 2020-06-20 DIAGNOSIS — R1032 Left lower quadrant pain: Secondary | ICD-10-CM | POA: Diagnosis not present

## 2020-06-20 DIAGNOSIS — M47896 Other spondylosis, lumbar region: Secondary | ICD-10-CM | POA: Diagnosis not present

## 2020-06-20 DIAGNOSIS — E119 Type 2 diabetes mellitus without complications: Secondary | ICD-10-CM | POA: Diagnosis not present

## 2020-06-20 DIAGNOSIS — K760 Fatty (change of) liver, not elsewhere classified: Secondary | ICD-10-CM | POA: Diagnosis not present

## 2020-06-20 DIAGNOSIS — M4306 Spondylolysis, lumbar region: Secondary | ICD-10-CM | POA: Diagnosis not present

## 2020-06-20 DIAGNOSIS — I10 Essential (primary) hypertension: Secondary | ICD-10-CM | POA: Diagnosis not present

## 2020-06-21 ENCOUNTER — Telehealth: Payer: Self-pay | Admitting: *Deleted

## 2020-06-21 NOTE — Telephone Encounter (Signed)
Transition Care Management Unsuccessful Follow-up Telephone Call  Date of discharge and from where:  06/20/2020 [facility] ED  Attempts:  1st Attempt  Reason for unsuccessful TCM follow-up call:  Left voice message

## 2020-06-22 NOTE — Telephone Encounter (Signed)
Transition Care Management Unsuccessful Follow-up Telephone Call  Date of discharge and from where:  06/20/2020 [facility] ED  Attempts:  2nd Attempt  Reason for unsuccessful TCM follow-up call:  Left voice message

## 2020-06-23 NOTE — Telephone Encounter (Signed)
Transition Care Management Unsuccessful Follow-up Telephone Call  Date of discharge and from where:  06/20/2020 [facility] ED  Attempts:  3rd Attempt  Reason for unsuccessful TCM follow-up call:  Left voice message

## 2020-06-24 ENCOUNTER — Ambulatory Visit: Payer: Medicaid Other | Admitting: Nurse Practitioner

## 2020-06-24 ENCOUNTER — Other Ambulatory Visit: Payer: Self-pay

## 2020-06-28 ENCOUNTER — Other Ambulatory Visit: Payer: Self-pay

## 2020-06-28 ENCOUNTER — Encounter: Payer: Self-pay | Admitting: Nurse Practitioner

## 2020-06-28 ENCOUNTER — Ambulatory Visit (INDEPENDENT_AMBULATORY_CARE_PROVIDER_SITE_OTHER): Payer: Medicaid Other | Admitting: Nurse Practitioner

## 2020-06-28 VITALS — BP 129/89 | HR 76 | Temp 97.8°F | Ht 62.0 in | Wt 394.0 lb

## 2020-06-28 DIAGNOSIS — R1084 Generalized abdominal pain: Secondary | ICD-10-CM | POA: Diagnosis not present

## 2020-06-28 DIAGNOSIS — Z09 Encounter for follow-up examination after completed treatment for conditions other than malignant neoplasm: Secondary | ICD-10-CM | POA: Diagnosis not present

## 2020-06-28 NOTE — Assessment & Plan Note (Signed)
Abdominal pain resolved.  Education provided to patient with printed handouts given.  On weight loss, calorie counting, exercise, diet and referral [facility].

## 2020-06-28 NOTE — Assessment & Plan Note (Signed)
Completed urgent care follow-up discharge summary.  Medication reconciliation patient verbalized understanding.  Follow-up with worsening unresolved symptoms.

## 2020-06-28 NOTE — Patient Instructions (Signed)
Calorie Counting for Weight Loss Calories are units of energy. Your body needs a certain number of calories from food to keep going throughout the day. When you eat or drink more calories than your body needs, your body stores the extra calories mostly as fat. When you eat or drink fewer calories than your body needs, your body burns fat to get the energy it needs. Calorie counting means keeping track of how many calories you eat and drink each day. Calorie counting can be helpful if you need to lose weight. If you eat fewer calories than your body needs, you should lose weight. Ask your health care provider what a healthy weight is for you. For calorie counting to work, you will need to eat the right number of calories each day to lose a healthy amount of weight per week. A dietitian can help you figure out how many calories you need in a day and will suggest ways to reach your calorie goal.  A healthy amount of weight to lose each week is usually 1-2 lb (0.5-0.9 kg). This usually means that your daily calorie intake should be reduced by 500-750 calories.  Eating 1,200-1,500 calories a day can help most women lose weight.  Eating 1,500-1,800 calories a day can help most men lose weight. What do I need to know about calorie counting? Work with your health care provider or dietitian to determine how many calories you should get each day. To meet your daily calorie goal, you will need to:  Find out how many calories are in each food that you would like to eat. Try to do this before you eat.  Decide how much of the food you plan to eat.  Keep a food log. Do this by writing down what you ate and how many calories it had. To successfully lose weight, it is important to balance calorie counting with a healthy lifestyle that includes regular activity. Where do I find calorie information? The number of calories in a food can be found on a Nutrition Facts label. If a food does not have a Nutrition Facts  label, try to look up the calories online or ask your dietitian for help. Remember that calories are listed per serving. If you choose to have more than one serving of a food, you will have to multiply the calories per serving by the number of servings you plan to eat. For example, the label on a package of bread might say that a serving size is 1 slice and that there are 90 calories in a serving. If you eat 1 slice, you will have eaten 90 calories. If you eat 2 slices, you will have eaten 180 calories.   How do I keep a food log? After each time that you eat, record the following in your food log as soon as possible:  What you ate. Be sure to include toppings, sauces, and other extras on the food.  How much you ate. This can be measured in cups, ounces, or number of items.  How many calories were in each food and drink.  The total number of calories in the food you ate. Keep your food log near you, such as in a pocket-sized notebook or on an app or website on your mobile phone. Some programs will calculate calories for you and show you how many calories you have left to meet your daily goal. What are some portion-control tips?  Know how many calories are in a serving. This will   help you know how many servings you can have of a certain food.  Use a measuring cup to measure serving sizes. You could also try weighing out portions on a kitchen scale. With time, you will be able to estimate serving sizes for some foods.  Take time to put servings of different foods on your favorite plates or in your favorite bowls and cups so you know what a serving looks like.  Try not to eat straight from a food's packaging, such as from a bag or box. Eating straight from the package makes it hard to see how much you are eating and can lead to overeating. Put the amount you would like to eat in a cup or on a plate to make sure you are eating the right portion.  Use smaller plates, glasses, and bowls for smaller  portions and to prevent overeating.  Try not to multitask. For example, avoid watching TV or using your computer while eating. If it is time to eat, sit down at a table and enjoy your food. This will help you recognize when you are full. It will also help you be more mindful of what and how much you are eating. What are tips for following this plan? Reading food labels  Check the calorie count compared with the serving size. The serving size may be smaller than what you are used to eating.  Check the source of the calories. Try to choose foods that are high in protein, fiber, and vitamins, and low in saturated fat, trans fat, and sodium. Shopping  Read nutrition labels while you shop. This will help you make healthy decisions about which foods to buy.  Pay attention to nutrition labels for low-fat or fat-free foods. These foods sometimes have the same number of calories or more calories than the full-fat versions. They also often have added sugar, starch, or salt to make up for flavor that was removed with the fat.  Make a grocery list of lower-calorie foods and stick to it. Cooking  Try to cook your favorite foods in a healthier way. For example, try baking instead of frying.  Use low-fat dairy products. Meal planning  Use more fruits and vegetables. One-half of your plate should be fruits and vegetables.  Include lean proteins, such as chicken, turkey, and fish. Lifestyle Each week, aim to do one of the following:  150 minutes of moderate exercise, such as walking.  75 minutes of vigorous exercise, such as running. General information  Know how many calories are in the foods you eat most often. This will help you calculate calorie counts faster.  Find a way of tracking calories that works for you. Get creative. Try different apps or programs if writing down calories does not work for you. What foods should I eat?  Eat nutritious foods. It is better to have a nutritious,  high-calorie food, such as an avocado, than a food with few nutrients, such as a bag of potato chips.  Use your calories on foods and drinks that will fill you up and will not leave you hungry soon after eating. ? Examples of foods that fill you up are nuts and nut butters, vegetables, lean proteins, and high-fiber foods such as whole grains. High-fiber foods are foods with more than 5 g of fiber per serving.  Pay attention to calories in drinks. Low-calorie drinks include water and unsweetened drinks. The items listed above may not be a complete list of foods and beverages you can eat.   Contact a dietitian for more information.   What foods should I limit? Limit foods or drinks that are not good sources of vitamins, minerals, or protein or that are high in unhealthy fats. These include:  Candy.  Other sweets.  Sodas, specialty coffee drinks, alcohol, and juice. The items listed above may not be a complete list of foods and beverages you should avoid. Contact a dietitian for more information. How do I count calories when eating out?  Pay attention to portions. Often, portions are much larger when eating out. Try these tips to keep portions smaller: ? Consider sharing a meal instead of getting your own. ? If you get your own meal, eat only half of it. Before you start eating, ask for a container and put half of your meal into it. ? When available, consider ordering smaller portions from the menu instead of full portions.  Pay attention to your food and drink choices. Knowing the way food is cooked and what is included with the meal can help you eat fewer calories. ? If calories are listed on the menu, choose the lower-calorie options. ? Choose dishes that include vegetables, fruits, whole grains, low-fat dairy products, and lean proteins. ? Choose items that are boiled, broiled, grilled, or steamed. Avoid items that are buttered, battered, fried, or served with cream sauce. Items labeled as  crispy are usually fried, unless stated otherwise. ? Choose water, low-fat milk, unsweetened iced tea, or other drinks without added sugar. If you want an alcoholic beverage, choose a lower-calorie option, such as a glass of wine or light beer. ? Ask for dressings, sauces, and syrups on the side. These are usually high in calories, so you should limit the amount you eat. ? If you want a salad, choose a garden salad and ask for grilled meats. Avoid extra toppings such as bacon, cheese, or fried items. Ask for the dressing on the side, or ask for olive oil and vinegar or lemon to use as dressing.  Estimate how many servings of a food you are given. Knowing serving sizes will help you be aware of how much food you are eating at restaurants. Where to find more information  Centers for Disease Control and Prevention: www.cdc.gov  [place] Department of Agriculture: myplate.gov Summary  Calorie counting means keeping track of how many calories you eat and drink each day. If you eat fewer calories than your body needs, you should lose weight.  A healthy amount of weight to lose per week is usually 1-2 lb (0.5-0.9 kg). This usually means reducing your daily calorie intake by 500-750 calories.  The number of calories in a food can be found on a Nutrition Facts label. If a food does not have a Nutrition Facts label, try to look up the calories online or ask your dietitian for help.  Use smaller plates, glasses, and bowls for smaller portions and to prevent overeating.  Use your calories on foods and drinks that will fill you up and not leave you hungry shortly after a meal. This information is not intended to replace advice given to you by your health care provider. Make sure you discuss any questions you have with your health care provider. Document Revised: 02/26/2019 Document Reviewed: 02/26/2019 Elsevier Patient Education  2021 Elsevier Inc.  

## 2020-06-28 NOTE — Assessment & Plan Note (Signed)
Patient was emotional in clinic today and crying.  She expresses feeling of frustration and sadness not knowing exactly where discharge how weight loss process.  Provided education to patient, support and guidance.  Patient has started walking, drinking more water, and has lost a total of 8 pounds from February till May.  Patient is motivated to continue and will start referral to healthy weight and wellness medical management.  Referral completed

## 2020-06-28 NOTE — Progress Notes (Signed)
Established Patient Office Visit  Subjective:  Patient ID: Anna [name], female    DOB: 23-Aug-1995  Age: 25 y.o. MRN: [id number]  CC:  Chief Complaint  Patient presents with  . Follow-up  . Abdominal Pain  . fatty liver    HPI Anna [name] presents for follow up emergency room visit for abdominal pain.  Patient was found to have fatty liver after completing scans. Patient was stabilized and sent home to follow-up with PCP.  Patient is not reporting abdominal pain today, no fever, nausea or vomiting.  Patient is concerned about weight loss and management needing direction and guidance on how to start.  Patient did mention that she has started walking and drinking more water.  Concerning patient's BMI  Patient is willing to work with weight loss management clinic for healthy lifestyle, and weight loss management.  Patient reports that her entire family is working on weight loss and healthy lifestyle.  Setting new goals and making lifestyle adjustments.    Past Medical History:  Diagnosis Date  . Allergy   . GERD (gastroesophageal reflux disease)   . Hypertension   . Morbid obesity with BMI of 70 and over, adult (HCC) 09/29/2017   with hypertension     Past Surgical History:  Procedure Laterality Date  . CESAREAN SECTION N/A 09/29/2017   Procedure: CESAREAN SECTION;  Surgeon: Tilda Burrow, MD;  Location: [facility] BIRTHING SUITES;  Service: Obstetrics;  Laterality: N/A;  . MYRINGOTOMY    . TONSILLECTOMY AND ADENOIDECTOMY      Family History  Problem Relation Age of Onset  . Asthma Mother   . Anemia Mother   . Hypertension Father   . Cancer Maternal Grandfather   . Hypertension Paternal Grandmother   . Heart failure Paternal Grandmother     Social History   Socioeconomic History  . Marital status: Single    Spouse name: Not on file  . Number of children: Not on file  . Years of education: Not on file  . Highest education level: Not on file  Occupational  History  . 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 Smoking status: Never Smoker  . Smokeless tobacco: Never Used  Vaping Use  . Vaping Use: Never used  Substance and Sexual Activity  . Alcohol use: No  . Drug use: No    Comment: THC positive noted in chart 03/21/2017  . Sexual activity: Yes    Birth control/protection: None  Other Topics Concern  . Not on file  Social History Narrative  . Not on file   Social Determinants of Health   Financial Resource Strain: Not on file  Food Insecurity: Not on file  Transportation Needs: Not on file  Physical Activity: Not on file  Stress: Not on file  Social Connections: Not on file  Intimate Partner Violence: Not on file    No outpatient medications prior to visit.   No facility-administered medications prior to visit.    Allergies  Allergen Reactions  . Diflucan [Fluconazole] Rash    ROS Review of Systems  Constitutional: Negative.   HENT: Negative.   Respiratory: Negative.   Cardiovascular: Negative.   Gastrointestinal: Negative.   Genitourinary: Negative.   Skin: Negative for rash.  All other systems reviewed and are negative.     Objective:    Physical Exam Vitals and nursing note reviewed.  Constitutional:      Appearance: She is obese.  HENT:     Head: Normocephalic.  Cardiovascular:     Rate and Rhythm: Normal rate and regular rhythm.     Heart sounds: Normal heart sounds.  Pulmonary:     Effort: Pulmonary effort is normal.     Breath sounds: Normal breath sounds.  Abdominal:     General: Bowel sounds are normal. There is no distension.     Tenderness: There is no abdominal tenderness. There is no right CVA tenderness or left CVA tenderness.  Skin:    Findings: No rash.  Neurological:     Mental Status: She is alert and oriented to person, place, and time.     BP 129/89   Pulse 76   Temp 97.8 F (36.6 C) (Temporal)   Ht 5\' 2"  (1.575 m)   Wt (!) 394  lb (178.7 kg)   SpO2 98%   BMI 72.06 kg/m  Wt Readings from Last 3 Encounters:  06/28/20 (!) 394 lb (178.7 kg)  03/24/20 (!) 403 lb 3.2 oz (182.9 kg)  11/06/17 (!) 371 lb (168.3 kg)     Health Maintenance Due  Topic Date Due  . COVID-19 Vaccine (1) Never done  . Hepatitis C Screening  Never done  . PAP-Cervical Cytology Screening  03/21/2020  . PAP SMEAR-Modifier  03/21/2020    There are no preventive care reminders to display for this patient.  Lab Results  Component Value Date   TSH 2.520 12/04/2016   Lab Results  Component Value Date   WBC 12.2 (H) 10/04/2017   HGB 9.4 (L) 10/04/2017   HCT 29.5 (L) 10/04/2017   MCV 76.0 (L) 10/04/2017   PLT 356 10/04/2017   Lab Results  Component Value Date   NA 138 10/05/2017   K 3.3 (L) 10/05/2017   CO2 25 10/05/2017   GLUCOSE 98 10/05/2017   BUN 11 10/05/2017   CREATININE 0.91 10/05/2017   BILITOT 1.1 10/04/2017   ALKPHOS 67 10/04/2017   AST 21 10/04/2017   ALT 19 10/04/2017   PROT 7.0 10/04/2017   ALBUMIN 2.9 (L) 10/04/2017   CALCIUM 7.8 (L) 10/05/2017   ANIONGAP 12 10/05/2017   Lab Results  Component Value Date   CHOL 174 12/04/2016   Lab Results  Component Value Date   HDL 41 12/04/2016   Lab Results  Component Value Date   LDLCALC 119 (H) 12/04/2016   Lab Results  Component Value Date   TRIG 69 12/04/2016   Lab Results  Component Value Date   CHOLHDL 4.2 12/04/2016   Lab Results  Component Value Date   HGBA1C 5.7 12/04/2016      Assessment & Plan:   Problem List Items Addressed This Visit      Other   Obesity, morbid, BMI 50 or higher (HCC)    Patient was emotional in clinic today and crying.  She expresses feeling of frustration and sadness not knowing exactly where discharge how weight loss process.  Provided education to patient, support and guidance.  Patient has started walking, drinking more water, and has lost a total of 8 pounds from February till May.  Patient is motivated to  continue and will start referral to healthy weight and wellness medical management.  Referral completed      Generalized abdominal pain - Primary    Abdominal pain resolved.  Education provided to patient with printed handouts given.  On weight loss, calorie counting, exercise, diet and referral to healthy [facility].  Need for follow-up care after discharge    Completed urgent care follow-up discharge summary.  Medication reconciliation patient verbalized understanding.  Follow-up with worsening unresolved symptoms.        Flowsheet Row Initial Prenatal from 03/21/2017 in [facility]  PHQ-9 Total Score 6       No orders of the defined types were placed in this encounter.   Follow-up: Return in about 6 weeks (around 08/09/2020), or if symptoms worsen or fail to improve.    Daryll Drown, NP

## 2020-07-15 ENCOUNTER — Encounter: Payer: Self-pay | Admitting: Nurse Practitioner

## 2020-08-11 ENCOUNTER — Ambulatory Visit (INDEPENDENT_AMBULATORY_CARE_PROVIDER_SITE_OTHER): Payer: Medicaid Other | Admitting: Nurse Practitioner

## 2020-08-11 ENCOUNTER — Encounter: Payer: Self-pay | Admitting: Nurse Practitioner

## 2020-08-11 ENCOUNTER — Other Ambulatory Visit: Payer: Self-pay

## 2020-08-11 NOTE — Progress Notes (Signed)
Established Patient Office Visit  Subjective:  Patient ID: Anna [name], female    DOB: [birth year]  Age: 25 y.o. MRN: [id number]  CC:  Chief Complaint  Patient presents with   Weight Check    HPI Anna [name] presents for weight loss management follow up. Patient has been eating healthier, drinking more water and doing exercise. Patient has lost a total of 15Lb  since last visit.   Past Medical History:  Diagnosis Date   Allergy    GERD (gastroesophageal reflux disease)    Hypertension    Morbid obesity with BMI of 70 and over, adult (HCC) 09/29/2017   with hypertension     Past Surgical History:  Procedure Laterality Date   CESAREAN SECTION N/A 09/29/2017   Procedure: CESAREAN SECTION;  Surgeon: Tilda Burrow, MD;  Location: [facility] BIRTHING SUITES;  Service: Obstetrics;  Laterality: N/A;   MYRINGOTOMY     TONSILLECTOMY AND ADENOIDECTOMY      Family History  Problem Relation Age of Onset   Asthma Mother    Anemia Mother    Hypertension Father    Cancer Maternal Grandfather    Hypertension Paternal Grandmother    Heart failure Paternal Grandmother     Social History   Socioeconomic History   Marital status: Single    Spouse name: Not on file   Number of children: Not on file   Years of education: Not on file   Highest education level: Not on file  Occupational History   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    Comment: [employer]  Tobacco Use   Smoking status: Never   Smokeless tobacco: Never  Vaping Use   Vaping Use: Never used  Substance and Sexual Activity   Alcohol use: No   Drug use: Not Currently    Comment: THC positive noted in chart 03/21/2017   Sexual activity: Yes    Birth control/protection: None  Other Topics Concern   Not on file  Social History Narrative   Not on file   Social Determinants of Health   Financial Resource Strain: Not on file  Food Insecurity: Not on file  Transportation Needs: Not on file  Physical  Activity: Not on file  Stress: Not on file  Social Connections: Not on file  Intimate Partner Violence: Not on file    No outpatient medications prior to visit.   No facility-administered medications prior to visit.    Allergies  Allergen Reactions   Diflucan [Fluconazole] Rash    ROS Review of Systems  Constitutional: Negative.   HENT: Negative.    Respiratory: Negative.    Gastrointestinal: Negative.   Skin:  Negative for rash.  All other systems reviewed and are negative.    Objective:    Physical Exam Vitals and nursing note reviewed.  Constitutional:      Appearance: Normal appearance. She is obese.  HENT:     Head: Normocephalic.     Nose: Nose normal.  Eyes:     Conjunctiva/sclera: Conjunctivae normal.  Cardiovascular:     Rate and Rhythm: Normal rate and regular rhythm.  Pulmonary:     Effort: Pulmonary effort is normal.     Breath sounds: Normal breath sounds.  Abdominal:     General: Bowel sounds are normal.  Skin:    Findings: No rash.  Neurological:     Mental Status: She is alert and oriented to person, place, and time.  Psychiatric:  Behavior: Behavior normal.    BP 133/89   Pulse 77   Temp 97.9 F (36.6 C) (Temporal)   Ht 5\' 2"  (1.575 m)   Wt (!) 387 lb (175.5 kg)   SpO2 100%   BMI 70.78 kg/m  Wt Readings from Last 3 Encounters:  08/11/20 (!) 387 lb (175.5 kg)  06/28/20 (!) 394 lb (178.7 kg)  03/24/20 (!) 403 lb 3.2 oz (182.9 kg)     Health Maintenance Due  Topic Date Due   COVID-19 Vaccine (1) Never done   Hepatitis C Screening  Never done   PAP-Cervical Cytology Screening  03/21/2020   PAP SMEAR-Modifier  03/21/2020    There are no preventive care reminders to display for this patient.  Lab Results  Component Value Date   TSH 2.520 12/04/2016   Lab Results  Component Value Date   WBC 12.2 (H) 10/04/2017   HGB 9.4 (L) 10/04/2017   HCT 29.5 (L) 10/04/2017   MCV 76.0 (L) 10/04/2017   PLT 356 10/04/2017   Lab  Results  Component Value Date   NA 138 10/05/2017   K 3.3 (L) 10/05/2017   CO2 25 10/05/2017   GLUCOSE 98 10/05/2017   BUN 11 10/05/2017   CREATININE 0.91 10/05/2017   BILITOT 1.1 10/04/2017   ALKPHOS 67 10/04/2017   AST 21 10/04/2017   ALT 19 10/04/2017   PROT 7.0 10/04/2017   ALBUMIN 2.9 (L) 10/04/2017   CALCIUM 7.8 (L) 10/05/2017   ANIONGAP 12 10/05/2017   Lab Results  Component Value Date   CHOL 174 12/04/2016   Lab Results  Component Value Date   HDL 41 12/04/2016   Lab Results  Component Value Date   LDLCALC 119 (H) 12/04/2016   Lab Results  Component Value Date   TRIG 69 12/04/2016   Lab Results  Component Value Date   CHOLHDL 4.2 12/04/2016   Lab Results  Component Value Date   HGBA1C 5.7 12/04/2016      Assessment & Plan:   Problem List Items Addressed This Visit       Other   Obesity, morbid, BMI 50 or higher (HCC) - Primary    Patient continues to make good progress with weight loss goals and has lost an additional 7 Lb making it a total of 15 Lb lost so far. We talked about saxenda today and will wait until the begining of 2023 to see how insurance will cover medication for weight loss management. 2024 clinical pharmacist is consulting with patient to help facilitate process. Follow up in 3 months. Continue healthy diet and exercise regimen. I Provided additional resources for food choices on the go and calorie counting for weight loss.        No orders of the defined types were placed in this encounter.   Follow-up: Return in about 3 months (around 11/11/2020).    11/13/2020, NP

## 2020-08-11 NOTE — Patient Instructions (Signed)

## 2020-08-11 NOTE — Assessment & Plan Note (Signed)
Patient continues to make good progress with weight loss goals and has lost an additional 7 Lb making it a total of 15 Lb lost so far. We talked about saxenda today and will wait until the begining of 2023 to see how insurance will cover medication for weight loss management. Raynelle Fanning clinical pharmacist is consulting with patient to help facilitate process. Follow up in 3 months. Continue healthy diet and exercise regimen. I Provided additional resources for food choices on the go and calorie counting for weight loss.

## 2020-11-11 ENCOUNTER — Encounter: Payer: Self-pay | Admitting: Nurse Practitioner

## 2020-11-11 ENCOUNTER — Ambulatory Visit: Payer: Medicaid Other | Admitting: Nurse Practitioner

## 2020-12-29 DIAGNOSIS — J101 Influenza due to other identified influenza virus with other respiratory manifestations: Secondary | ICD-10-CM | POA: Diagnosis not present

## 2020-12-29 DIAGNOSIS — R0602 Shortness of breath: Secondary | ICD-10-CM | POA: Diagnosis not present

## 2020-12-29 DIAGNOSIS — R110 Nausea: Secondary | ICD-10-CM | POA: Diagnosis not present

## 2020-12-29 DIAGNOSIS — R197 Diarrhea, unspecified: Secondary | ICD-10-CM | POA: Diagnosis not present

## 2020-12-29 DIAGNOSIS — R059 Cough, unspecified: Secondary | ICD-10-CM | POA: Diagnosis not present

## 2021-03-26 DIAGNOSIS — Z03818 Encounter for observation for suspected exposure to other biological agents ruled out: Secondary | ICD-10-CM | POA: Diagnosis not present

## 2021-03-26 DIAGNOSIS — R059 Cough, unspecified: Secondary | ICD-10-CM | POA: Diagnosis not present

## 2021-08-29 ENCOUNTER — Encounter: Payer: Medicaid Other | Admitting: Nurse Practitioner

## 2021-08-29 ENCOUNTER — Encounter: Payer: Self-pay | Admitting: Nurse Practitioner

## 2021-11-20 DIAGNOSIS — K529 Noninfective gastroenteritis and colitis, unspecified: Secondary | ICD-10-CM | POA: Diagnosis not present

## 2021-11-20 DIAGNOSIS — B349 Viral infection, unspecified: Secondary | ICD-10-CM | POA: Diagnosis not present

## 2021-11-20 DIAGNOSIS — R110 Nausea: Secondary | ICD-10-CM | POA: Diagnosis not present

## 2021-12-13 ENCOUNTER — Encounter: Payer: Medicaid Other | Admitting: Nurse Practitioner

## 2022-01-24 ENCOUNTER — Encounter: Payer: Medicaid Other | Admitting: Nurse Practitioner

## 2022-03-02 DIAGNOSIS — L02219 Cutaneous abscess of trunk, unspecified: Secondary | ICD-10-CM | POA: Diagnosis not present

## 2022-03-02 DIAGNOSIS — K760 Fatty (change of) liver, not elsewhere classified: Secondary | ICD-10-CM | POA: Diagnosis not present

## 2022-03-02 DIAGNOSIS — R109 Unspecified abdominal pain: Secondary | ICD-10-CM | POA: Diagnosis not present

## 2022-03-02 DIAGNOSIS — R300 Dysuria: Secondary | ICD-10-CM | POA: Diagnosis not present

## 2022-03-02 DIAGNOSIS — L03319 Cellulitis of trunk, unspecified: Secondary | ICD-10-CM | POA: Diagnosis not present

## 2022-10-31 ENCOUNTER — Ambulatory Visit: Payer: Medicaid Other | Admitting: Family Medicine

## 2022-11-02 ENCOUNTER — Encounter: Payer: Self-pay | Admitting: Family Medicine

## 2022-11-12 DIAGNOSIS — N910 Primary amenorrhea: Secondary | ICD-10-CM | POA: Diagnosis not present

## 2022-11-12 DIAGNOSIS — Z3201 Encounter for pregnancy test, result positive: Secondary | ICD-10-CM | POA: Diagnosis not present

## 2022-11-14 ENCOUNTER — Ambulatory Visit: Payer: Medicaid Other | Admitting: Family Medicine

## 2022-11-27 ENCOUNTER — Other Ambulatory Visit: Payer: Self-pay | Admitting: Obstetrics & Gynecology

## 2022-11-27 DIAGNOSIS — O3680X0 Pregnancy with inconclusive fetal viability, not applicable or unspecified: Secondary | ICD-10-CM

## 2022-11-28 ENCOUNTER — Other Ambulatory Visit: Payer: Medicaid Other

## 2022-11-28 DIAGNOSIS — O3680X0 Pregnancy with inconclusive fetal viability, not applicable or unspecified: Secondary | ICD-10-CM | POA: Diagnosis not present

## 2022-11-28 DIAGNOSIS — Z3A01 Less than 8 weeks gestation of pregnancy: Secondary | ICD-10-CM | POA: Diagnosis not present

## 2022-11-28 NOTE — Progress Notes (Signed)
[place] 7+5 wks,single IUP with yolk sac,CRL 13.67 mm,FHR 144 bpm,normal left ovary,right ovary not visualized

## 2022-12-19 ENCOUNTER — Encounter: Payer: Self-pay | Admitting: Women's Health

## 2022-12-31 ENCOUNTER — Other Ambulatory Visit: Payer: Self-pay | Admitting: Obstetrics & Gynecology

## 2022-12-31 ENCOUNTER — Encounter: Payer: Self-pay | Admitting: Women's Health

## 2022-12-31 DIAGNOSIS — Z3682 Encounter for antenatal screening for nuchal translucency: Secondary | ICD-10-CM

## 2022-12-31 DIAGNOSIS — Z98891 History of uterine scar from previous surgery: Secondary | ICD-10-CM | POA: Insufficient documentation

## 2023-01-01 ENCOUNTER — Ambulatory Visit (INDEPENDENT_AMBULATORY_CARE_PROVIDER_SITE_OTHER): Payer: Medicaid Other

## 2023-01-01 ENCOUNTER — Encounter: Payer: Medicaid Other | Admitting: *Deleted

## 2023-01-01 ENCOUNTER — Encounter: Payer: Self-pay | Admitting: Women's Health

## 2023-01-01 ENCOUNTER — Ambulatory Visit: Payer: Medicaid Other | Admitting: Women's Health

## 2023-01-01 VITALS — BP 150/93 | HR 77 | Wt 393.0 lb

## 2023-01-01 DIAGNOSIS — Z3682 Encounter for antenatal screening for nuchal translucency: Secondary | ICD-10-CM

## 2023-01-01 DIAGNOSIS — O0991 Supervision of high risk pregnancy, unspecified, first trimester: Secondary | ICD-10-CM

## 2023-01-01 DIAGNOSIS — Z131 Encounter for screening for diabetes mellitus: Secondary | ICD-10-CM | POA: Diagnosis not present

## 2023-01-01 DIAGNOSIS — Z6841 Body Mass Index (BMI) 40.0 and over, adult: Secondary | ICD-10-CM

## 2023-01-01 DIAGNOSIS — I10 Essential (primary) hypertension: Secondary | ICD-10-CM | POA: Diagnosis not present

## 2023-01-01 DIAGNOSIS — Z3A12 12 weeks gestation of pregnancy: Secondary | ICD-10-CM | POA: Diagnosis not present

## 2023-01-01 DIAGNOSIS — O0992 Supervision of high risk pregnancy, unspecified, second trimester: Secondary | ICD-10-CM

## 2023-01-01 DIAGNOSIS — Z1332 Encounter for screening for maternal depression: Secondary | ICD-10-CM

## 2023-01-01 DIAGNOSIS — Z31430 Encounter of female for testing for genetic disease carrier status for procreative management: Secondary | ICD-10-CM | POA: Diagnosis not present

## 2023-01-01 DIAGNOSIS — O0990 Supervision of high risk pregnancy, unspecified, unspecified trimester: Secondary | ICD-10-CM | POA: Insufficient documentation

## 2023-01-01 DIAGNOSIS — Z98891 History of uterine scar from previous surgery: Secondary | ICD-10-CM | POA: Diagnosis not present

## 2023-01-01 MED ORDER — ASPIRIN 81 MG PO TBEC: 162.0000 mg | DELAYED_RELEASE_TABLET | Freq: Every day | ORAL | 2 refills | Status: DC

## 2023-01-01 MED ORDER — NIFEDIPINE ER OSMOTIC RELEASE 30 MG PO TB24: 30.0000 mg | ORAL_TABLET | Freq: Every day | ORAL | 3 refills | Status: DC

## 2023-01-01 NOTE — Progress Notes (Signed)
[place] 12+4 wks,measurements c/w dates,FHR 150 bpm,normal ovaries,anterior placenta,NB present,NT 1.7 mm

## 2023-01-01 NOTE — Patient Instructions (Signed)
[place]ah, thank you for choosing our office today! We appreciate the opportunity to meet your healthcare needs. You may receive a short survey by mail, e-mail, or through [employer]. If you are happy with your care we would appreciate if you could take just a few minutes to complete the survey questions. We read all of your comments and take your feedback very seriously. Thank you again for choosing our office.  Center for [employer] [facility] Team at [facility]  [facility] at [facility] ([address] [place], [place] [address]) Entrance C, located off of [address] Free 24/7 valet parking   Nausea & Vomiting Have saltine crackers or pretzels by your bed and eat a few bites before you raise your head out of bed in the morning Eat small frequent meals throughout the day instead of large meals Drink plenty of fluids throughout the day to stay hydrated, just don't drink a lot of fluids with your meals.  This can make your stomach fill up faster making you feel sick Do not brush your teeth right after you eat Products with real ginger are good for nausea, like ginger ale and ginger hard candy Make sure it says made with real ginger! Sucking on sour candy like lemon heads is also good for nausea If your prenatal vitamins make you nauseated, take them at night so you will sleep through the nausea Sea Bands If you feel like you need medicine for the nausea & vomiting please let us know If you are unable to keep any fluids or food down please let us know   Constipation Drink plenty of fluid, preferably water, throughout the day Eat foods high in fiber such as fruits, vegetables, and grains Exercise, such as walking, is a good way to keep your bowels regular Drink warm fluids, especially warm prune juice, or decaf coffee Eat a 1/2 cup of real oatmeal (not instant), 1/2 cup applesauce, and 1/2-1 cup warm prune juice every day If needed, you may take Colace (docusate sodium) stool  softener once or twice a day to help keep the stool soft.  If you still are having problems with constipation, you may take Miralax once daily as needed to help keep your bowels regular.   Home Blood Pressure Monitoring for Patients   Your provider has recommended that you check your blood pressure (BP) at least once a week at home. If you do not have a blood pressure cuff at home, one will be provided for you. Contact your provider if you have not received your monitor within 1 week.   Helpful Tips for Accurate Home Blood Pressure Checks  Don't smoke, exercise, or drink caffeine 30 minutes before checking your BP Use the restroom before checking your BP (a full bladder can raise your pressure) Relax in a comfortable upright chair Feet on the ground Left arm resting comfortably on a flat surface at the level of your heart Legs uncrossed Back supported Sit quietly and don't talk Place the cuff on your bare arm Adjust snuggly, so that only two fingertips can fit between your skin and the top of the cuff Check 2 readings separated by at least one minute Keep a log of your BP readings For a visual, please reference this diagram: http://ccnc.care/bpdiagram  Provider Name: [facility]     Phone: [phone]  Zone 1: ALL CLEAR  Continue to monitor your symptoms:  BP reading is less than 140 (top number) or less than 90 (bottom  number)  No right upper stomach pain No headaches or seeing spots No feeling nauseated or throwing up No swelling in face and hands  Zone 2: CAUTION Call your doctor's office for any of the following:  BP reading is greater than 140 (top number) or greater than 90 (bottom number)  Stomach pain under your ribs in the middle or right side Headaches or seeing spots Feeling nauseated or throwing up Swelling in face and hands  Zone 3: EMERGENCY  Seek immediate medical care if you have any of the following:  BP reading is greater than160 (top number) or  greater than 110 (bottom number) Severe headaches not improving with Tylenol Serious difficulty catching your breath Any worsening symptoms from Zone 2    First Trimester of Pregnancy The first trimester of pregnancy is from week 1 until the end of week 12 (months 1 through 3). A week after a sperm fertilizes an egg, the egg will implant on the wall of the uterus. This embryo will begin to develop into a baby. Genes from you and your partner are forming the baby. The female genes determine whether the baby is a boy or a girl. At 6-8 weeks, the eyes and face are formed, and the heartbeat can be seen on ultrasound. At the end of 12 weeks, all the baby's organs are formed.  Now that you are pregnant, you will want to do everything you can to have a healthy baby. Two of the most important things are to get good prenatal care and to follow your health care provider's instructions. Prenatal care is all the medical care you receive before the baby's birth. This care will help prevent, find, and treat any problems during the pregnancy and childbirth. BODY CHANGES Your body goes through many changes during pregnancy. The changes vary from woman to woman.  You may gain or lose a couple of pounds at first. You may feel sick to your stomach (nauseous) and throw up (vomit). If the vomiting is uncontrollable, call your health care provider. You may tire easily. You may develop headaches that can be relieved by medicines approved by your health care provider. You may urinate more often. Painful urination may mean you have a bladder infection. You may develop heartburn as a result of your pregnancy. You may develop constipation because certain hormones are causing the muscles that push waste through your intestines to slow down. You may develop hemorrhoids or swollen, bulging veins (varicose veins). Your breasts may begin to grow larger and become tender. Your nipples may stick out more, and the tissue that  surrounds them (areola) may become darker. Your gums may bleed and may be sensitive to brushing and flossing. Dark spots or blotches (chloasma, mask of pregnancy) may develop on your face. This will likely fade after the baby is born. Your menstrual periods will stop. You may have a loss of appetite. You may develop cravings for certain kinds of food. You may have changes in your emotions from day to day, such as being excited to be pregnant or being concerned that something may go wrong with the pregnancy and baby. You may have more vivid and strange dreams. You may have changes in your hair. These can include thickening of your hair, rapid growth, and changes in texture. Some women also have hair loss during or after pregnancy, or hair that feels dry or thin. Your hair will most likely return to normal after your baby is born. WHAT TO EXPECT AT YOUR PRENATAL  VISITS During a routine prenatal visit: You will be weighed to make sure you and the baby are growing normally. Your blood pressure will be taken. Your abdomen will be measured to track your baby's growth. The fetal heartbeat will be listened to starting around week 10 or 12 of your pregnancy. Test results from any previous visits will be discussed. Your health care provider may ask you: How you are feeling. If you are feeling the baby move. If you have had any abnormal symptoms, such as leaking fluid, bleeding, severe headaches, or abdominal cramping. If you have any questions. Other tests that may be performed during your first trimester include: Blood tests to find your blood type and to check for the presence of any previous infections. They will also be used to check for low iron levels (anemia) and Rh antibodies. Later in the pregnancy, blood tests for diabetes will be done along with other tests if problems develop. Urine tests to check for infections, diabetes, or protein in the urine. An ultrasound to confirm the proper growth  and development of the baby. An amniocentesis to check for possible genetic problems. Fetal screens for spina bifida and Down syndrome. You may need other tests to make sure you and the baby are doing well. HOME CARE INSTRUCTIONS  Medicines Follow your health care provider's instructions regarding medicine use. Specific medicines may be either safe or unsafe to take during pregnancy. Take your prenatal vitamins as directed. If you develop constipation, try taking a stool softener if your health care provider approves. Diet Eat regular, well-balanced meals. Choose a variety of foods, such as meat or vegetable-based protein, fish, milk and low-fat dairy products, vegetables, fruits, and whole grain breads and cereals. Your health care provider will help you determine the amount of weight gain that is right for you. Avoid raw meat and uncooked cheese. These carry germs that can cause birth defects in the baby. Eating four or five small meals rather than three large meals a day may help relieve nausea and vomiting. If you start to feel nauseous, eating a few soda crackers can be helpful. Drinking liquids between meals instead of during meals also seems to help nausea and vomiting. If you develop constipation, eat more high-fiber foods, such as fresh vegetables or fruit and whole grains. Drink enough fluids to keep your urine clear or pale yellow. Activity and Exercise Exercise only as directed by your health care provider. Exercising will help you: Control your weight. Stay in shape. Be prepared for labor and delivery. Experiencing pain or cramping in the lower abdomen or low back is a good sign that you should stop exercising. Check with your health care provider before continuing normal exercises. Try to avoid standing for long periods of time. Move your legs often if you must stand in one place for a long time. Avoid heavy lifting. Wear low-heeled shoes, and practice good posture. You may  continue to have sex unless your health care provider directs you otherwise. Relief of Pain or Discomfort Wear a good support bra for breast tenderness.   Take warm sitz baths to soothe any pain or discomfort caused by hemorrhoids. Use hemorrhoid cream if your health care provider approves.   Rest with your legs elevated if you have leg cramps or low back pain. If you develop varicose veins in your legs, wear support hose. Elevate your feet for 15 minutes, 3-4 times a day. Limit salt in your diet. Prenatal Care Schedule your prenatal visits by the  twelfth week of pregnancy. They are usually scheduled monthly at first, then more often in the last 2 months before delivery. Write down your questions. Take them to your prenatal visits. Keep all your prenatal visits as directed by your health care provider. Safety Wear your seat belt at all times when driving. Make a list of emergency phone numbers, including numbers for family, friends, the hospital, and police and fire departments. General Tips Ask your health care provider for a referral to a local prenatal education class. Begin classes no later than at the beginning of month 6 of your pregnancy. Ask for help if you have counseling or nutritional needs during pregnancy. Your health care provider can offer advice or refer you to specialists for help with various needs. Do not use hot tubs, steam rooms, or saunas. Do not douche or use tampons or scented sanitary pads. Do not cross your legs for long periods of time. Avoid cat litter boxes and soil used by cats. These carry germs that can cause birth defects in the baby and possibly loss of the fetus by miscarriage or stillbirth. Avoid all smoking, herbs, alcohol, and medicines not prescribed by your health care provider. Chemicals in these affect the formation and growth of the baby. Schedule a dentist appointment. At home, brush your teeth with a soft toothbrush and be gentle when you floss. SEEK  MEDICAL CARE IF:  You have dizziness. You have mild pelvic cramps, pelvic pressure, or nagging pain in the abdominal area. You have persistent nausea, vomiting, or diarrhea. You have a bad smelling vaginal discharge. You have pain with urination. You notice increased swelling in your face, hands, legs, or ankles. SEEK IMMEDIATE MEDICAL CARE IF:  You have a fever. You are leaking fluid from your vagina. You have spotting or bleeding from your vagina. You have severe abdominal cramping or pain. You have rapid weight gain or loss. You vomit blood or material that looks like coffee grounds. You are exposed to [place] measles and have never had them. You are exposed to fifth disease or chickenpox. You develop a severe headache. You have shortness of breath. You have any kind of trauma, such as from a fall or a car accident. Document Released: 01/09/2001 Document Revised: 06/01/2013 Document Reviewed: 11/25/2012 [facility] Patient Information 2015 [place], [place]. This information is not intended to replace advice given to you by your health care provider. Make sure you discuss any questions you have with your health care provider.

## 2023-01-01 NOTE — Progress Notes (Signed)
INITIAL OBSTETRICAL VISIT Patient name: Anna [name] MRN [id number]  Date of birth: 1995-09-05 Chief Complaint:   Initial Prenatal Visit  History of Present Illness:   Anna [name] is a 27 y.o. G27P1011 African-American female at [redacted]w[redacted]d by LMP c/w u/s at 7 weeks with an Estimated Date of Delivery: 07/12/23 being seen today for her initial obstetrical visit.   Patient's last menstrual period was 10/05/2022. Her obstetrical history is significant for  37wk C/S after failed 4d IOL for pre-e, was readmitted w/ pp severe pre-e and wound cellulitis .   Today she reports no complaints.  CHTN-no meds, can tell when bp is high, doesn't feel well Last pap 2019. Results were: NILM w/ HRHPV not done     01/01/2023   11:22 AM 03/24/2020    2:32 PM 03/21/2017    2:18 PM 12/04/2016    2:28 PM  Depression screen PHQ 2/9  Decreased Interest 0 0 0 0  Down, Depressed, Hopeless 0 0 1 0  PHQ - 2 Score 0 0 1 0  Altered sleeping 0  2   Tired, decreased energy 0  2   Change in appetite 0  0   Feeling bad or failure about yourself  0  0   Trouble concentrating 0  0   Moving slowly or fidgety/restless 0  1   Suicidal thoughts 0  0   PHQ-9 Score 0  6         01/01/2023   11:22 AM  GAD 7 : Generalized Anxiety Score  Nervous, Anxious, on Edge 0  Control/stop worrying 1  Worry too much - different things 1  Trouble relaxing 1  Restless 0  Easily annoyed or irritable 0  Afraid - awful might happen 1  Total GAD 7 Score 4     Review of Systems:   Pertinent items are noted in HPI Denies cramping/contractions, leakage of fluid, vaginal bleeding, abnormal vaginal discharge w/ itching/odor/irritation, headaches, visual changes, shortness of breath, chest pain, abdominal pain, severe nausea/vomiting, or problems with urination or bowel movements unless otherwise stated above.  Pertinent History Reviewed:  Reviewed past medical,surgical, social, obstetrical and family history.  Reviewed  problem list, medications and allergies. OB History  Gravida Para Term Preterm AB Living  3 1 1   1 1   SAB IAB Ectopic Multiple Live Births  1       1    # Outcome Date GA Lbr Len/2nd Weight Sex Type Anes PTL Lv  3 Current           2 SAB 08/2018          1 Term 09/29/17 [redacted]w[redacted]d  7 lb 10 oz (3.459 kg) F CS-LTranv   LIV     Complications: Failure to Progress in First Stage, Pre-eclampsia   Physical Assessment:   Vitals:   01/01/23 1124 01/01/23 1135  BP: (!) 155/97 (!) 150/93  Pulse: 76 77  Weight: (!) 393 lb (178.3 kg)   Body mass index is 71.88 kg/m.       Physical Examination:  General appearance - well appearing, and in no distress  Mental status - alert, oriented to person, place, and time  Psych:  She has a normal mood and affect  Skin - warm and dry, normal color, no suspicious lesions noted  Chest - effort normal, all lung fields clear to auscultation bilaterally  Heart - normal rate and regular rhythm  Abdomen - soft, nontender  Extremities:  No swelling or varicosities noted  Thin prep pap is not done-pt wants next visit  Chaperone: N/A    TODAY'S NT [place] 12+4 wks,measurements c/w dates,FHR 150 bpm,normal ovaries,anterior placenta,NB present,NT 1.7 mm   No results found for this or any previous visit (from the past 24 hour(s)).  Assessment & Plan:  1) High-Risk Pregnancy G3P1011 at [redacted]w[redacted]d with an Estimated Date of Delivery: 07/12/23   2) Initial OB visit  3) CHTN> no meds currently, start ASA 162mg  daily, nifedipine 30mg  daily, f/u 1wk for bp check w/ nurse. Baseline labs today  4) PG BMI 73  5) Prev c/s> for failed 4d IOL, leaning towards RCS, gave TOLAC consent to review  Meds:  Meds ordered this encounter  Medications   aspirin EC 81 MG tablet    Sig: Take 2 tablets (162 mg total) by mouth daily. Swallow whole.    Dispense:  180 tablet    Refill:  2   NIFEdipine (PROCARDIA-XL/NIFEDICAL-XL) 30 MG 24 hr tablet    Sig: Take 1 tablet (30 mg total) by mouth  daily.    Dispense:  90 tablet    Refill:  3    Initial labs obtained Continue prenatal vitamins Reviewed n/v relief measures and warning s/s to report Reviewed recommended weight gain based on pre-gravid BMI Encouraged well-balanced diet Genetic & carrier screening discussed: requests Panorama, NT/IT, and Horizon  Ultrasound discussed; fetal survey: requested CCNC completed> form faxed if has or is planning to apply for medicaid The nature of [employer] for [employer] with multiple MDs and other Advanced Practice Providers was explained to patient; also emphasized that fellows, residents, and students are part of our team. Does have bp cuff at work. Office bp cuff given: no. Rx sent: n/a. Check bp weekly, let us know if consistently >140/90.    Follow-up: Return in about 1 week (around 01/08/2023) for nurse bp check; then 4wks from now HROB 2nd IT w/ MD; 8wks anatomy u/s HROB w/ MD.   Orders Placed This Encounter  Procedures   Urine Culture   GC/Chlamydia Probe Amp   Integrated 1   Protein / creatinine ratio, urine   PANORAMA PRENATAL TEST   HORIZON CUSTOM   Hemoglobin A1c   CBC/D/Plt+RPR+Rh+ABO+RubIgG...   Comprehensive metabolic panel    Cheral Marker CNM, [facility] 01/01/2023 12:02 PM

## 2023-01-03 LAB — URINE CULTURE

## 2023-01-03 LAB — GC/CHLAMYDIA PROBE AMP
Chlamydia trachomatis, NAA: NEGATIVE
Neisseria Gonorrhoeae by PCR: NEGATIVE

## 2023-01-04 LAB — INTEGRATED 1
Crown Rump Length: 62.2 mm
Gest. Age on Collection Date: 12.4 wk
Maternal Age at EDD: 28 a
Nuchal Translucency (NT): 1.7 mm
Number of Fetuses: 1
PAPP-A Value: 104.1 ng/mL
Sonographer ID#: 309760
Weight: 393 [lb_av]

## 2023-01-04 LAB — CBC/D/PLT+RPR+RH+ABO+RUBIGG...
Antibody Screen: NEGATIVE
Basophils Absolute: 0 10*3/uL (ref 0.0–0.2)
Basos: 0 %
EOS (ABSOLUTE): 0.1 10*3/uL (ref 0.0–0.4)
Eos: 1 %
HCV Ab: NONREACTIVE
HIV Screen 4th Generation wRfx: NONREACTIVE
Hematocrit: 36.9 % (ref 34.0–46.6)
Hemoglobin: 11.7 g/dL (ref 11.1–15.9)
Hepatitis B Surface Ag: NEGATIVE
Immature Grans (Abs): 0 10*3/uL (ref 0.0–0.1)
Immature Granulocytes: 0 %
Lymphocytes Absolute: 1.7 10*3/uL (ref 0.7–3.1)
Lymphs: 22 %
MCH: 25.6 pg — ABNORMAL LOW (ref 26.6–33.0)
MCHC: 31.7 g/dL (ref 31.5–35.7)
MCV: 81 fL (ref 79–97)
Monocytes Absolute: 0.5 10*3/uL (ref 0.1–0.9)
Monocytes: 7 %
Neutrophils Absolute: 5.3 10*3/uL (ref 1.4–7.0)
Neutrophils: 70 %
Platelets: 326 10*3/uL (ref 150–450)
RBC: 4.57 x10E6/uL (ref 3.77–5.28)
RDW: 14.1 % (ref 11.7–15.4)
RPR Ser Ql: NONREACTIVE
Rh Factor: POSITIVE
Rubella Antibodies, IGG: 10.2 {index} (ref >0.99)
WBC: 7.5 10*3/uL (ref 3.4–10.8)

## 2023-01-04 LAB — COMPREHENSIVE METABOLIC PANEL
ALT: 30 [IU]/L (ref 0–32)
AST: 22 [IU]/L (ref 0–40)
Albumin: 4 g/dL (ref 4.0–5.0)
Alkaline Phosphatase: 62 [IU]/L (ref 44–121)
BUN/Creatinine Ratio: 19 (ref 9–23)
BUN: 9 mg/dL (ref 6–20)
Bilirubin Total: 0.3 mg/dL (ref 0.0–1.2)
CO2: 19 mmol/L — ABNORMAL LOW (ref 20–29)
Calcium: 9.5 mg/dL (ref 8.7–10.2)
Chloride: 100 mmol/L (ref 96–106)
Creatinine, Ser: 0.48 mg/dL — ABNORMAL LOW (ref 0.57–1.00)
Globulin, Total: 3.1 g/dL (ref 1.5–4.5)
Glucose: 85 mg/dL (ref 70–99)
Potassium: 4.5 mmol/L (ref 3.5–5.2)
Sodium: 135 mmol/L (ref 134–144)
Total Protein: 7.1 g/dL (ref 6.0–8.5)
eGFR: 133 mL/min/{1.73_m2} (ref >59)

## 2023-01-04 LAB — HCV INTERPRETATION

## 2023-01-04 LAB — HEMOGLOBIN A1C
Est. average glucose Bld gHb Est-mCnc: 120 mg/dL
Hgb A1c MFr Bld: 5.8 % — ABNORMAL HIGH (ref 4.8–5.6)

## 2023-01-04 LAB — PROTEIN / CREATININE RATIO, URINE
Creatinine, Urine: 205.4 mg/dL
Protein, Ur: 36.1 mg/dL
Protein/Creat Ratio: 176 mg/g{creat} (ref 0–200)

## 2023-01-08 ENCOUNTER — Other Ambulatory Visit: Payer: Medicaid Other

## 2023-01-08 DIAGNOSIS — O0991 Supervision of high risk pregnancy, unspecified, first trimester: Secondary | ICD-10-CM

## 2023-01-08 DIAGNOSIS — Z3A13 13 weeks gestation of pregnancy: Secondary | ICD-10-CM

## 2023-01-08 DIAGNOSIS — Z131 Encounter for screening for diabetes mellitus: Secondary | ICD-10-CM

## 2023-01-09 LAB — PANORAMA PRENATAL TEST FULL PANEL:PANORAMA TEST PLUS 5 ADDITIONAL MICRODELETIONS: FETAL FRACTION: 1.8

## 2023-01-11 LAB — HORIZON CUSTOM: REPORT SUMMARY: POSITIVE — AB

## 2023-01-14 ENCOUNTER — Encounter: Payer: Self-pay | Admitting: Women's Health

## 2023-01-14 DIAGNOSIS — D563 Thalassemia minor: Secondary | ICD-10-CM | POA: Insufficient documentation

## 2023-01-29 ENCOUNTER — Encounter: Payer: Medicaid Other | Admitting: Obstetrics & Gynecology

## 2023-02-01 ENCOUNTER — Ambulatory Visit (INDEPENDENT_AMBULATORY_CARE_PROVIDER_SITE_OTHER): Payer: Medicaid Other | Admitting: Obstetrics & Gynecology

## 2023-02-01 ENCOUNTER — Other Ambulatory Visit (HOSPITAL_COMMUNITY)
Admission: RE | Admit: 2023-02-01 | Discharge: 2023-02-01 | Disposition: A | Discharge status: Home / Self Care | Payer: Medicaid Other | Source: Ambulatory Visit | Attending: Obstetrics & Gynecology | Admitting: Obstetrics & Gynecology

## 2023-02-01 ENCOUNTER — Encounter: Payer: Self-pay | Admitting: Obstetrics & Gynecology

## 2023-02-01 VITALS — BP 128/88 | HR 90 | Wt 389.8 lb

## 2023-02-01 DIAGNOSIS — Z1379 Encounter for other screening for genetic and chromosomal anomalies: Secondary | ICD-10-CM | POA: Diagnosis not present

## 2023-02-01 DIAGNOSIS — Z124 Encounter for screening for malignant neoplasm of cervix: Secondary | ICD-10-CM | POA: Diagnosis not present

## 2023-02-01 DIAGNOSIS — O0992 Supervision of high risk pregnancy, unspecified, second trimester: Secondary | ICD-10-CM

## 2023-02-01 DIAGNOSIS — O10919 Unspecified pre-existing hypertension complicating pregnancy, unspecified trimester: Secondary | ICD-10-CM

## 2023-02-01 DIAGNOSIS — Z3A17 17 weeks gestation of pregnancy: Secondary | ICD-10-CM

## 2023-02-01 MED ORDER — PANTOPRAZOLE SODIUM 40 MG PO TBEC: 40.0000 mg | DELAYED_RELEASE_TABLET | Freq: Every day | ORAL | 11 refills | Status: DC

## 2023-02-01 NOTE — Progress Notes (Signed)
 HIGH-RISK PREGNANCY VISIT Patient name: Anna [name] MRN [id number]  Date of birth: 09/06/1995 Chief Complaint:   Routine Prenatal Visit (2nd IT, pap)  History of Present Illness:   Anna [name] is a 28 y.o. G22P1011 female at [redacted]w[redacted]d with an Estimated Date of Delivery: 07/12/23 being seen today for ongoing management of a high-risk pregnancy complicated by morbid obesity + cHTN on procardia  xl 30  +previous C section, wants repeat   Today she reports GERD, N/V. Contractions: Not present.  .  Movement: Absent. denies leaking of fluid.      01/01/2023   11:22 AM 03/24/2020    2:32 PM 03/21/2017    2:18 PM 12/04/2016    2:28 PM  Depression screen PHQ 2/9  Decreased Interest 0 0 0 0  Down, Depressed, Hopeless 0 0 1 0  PHQ - 2 Score 0 0 1 0  Altered sleeping 0  2   Tired, decreased energy 0  2   Change in appetite 0  0   Feeling bad or failure about yourself  0  0   Trouble concentrating 0  0   Moving slowly or fidgety/restless 0  1   Suicidal thoughts 0  0   PHQ-9 Score 0  6         01/01/2023   11:22 AM  GAD 7 : Generalized Anxiety Score  Nervous, Anxious, on Edge 0  Control/stop worrying 1  Worry too much - different things 1  Trouble relaxing 1  Restless 0  Easily annoyed or irritable 0  Afraid - awful might happen 1  Total GAD 7 Score 4     Review of Systems:   Pertinent items are noted in HPI Denies abnormal vaginal discharge w/ itching/odor/irritation, headaches, visual changes, shortness of breath, chest pain, abdominal pain, severe nausea/vomiting, or problems with urination or bowel movements unless otherwise stated above. Pertinent History Reviewed:  Reviewed past medical,surgical, social, obstetrical and family history.  Reviewed problem list, medications and allergies. Physical Assessment:   Vitals:   02/01/23 1003  BP: 128/88  Pulse: 90  Weight: (!) 389 lb 12.8 oz (176.8 kg)  Body mass index is 71.3 kg/m.           Physical Examination:    General appearance: alert, well appearing, and in no distress  Mental status: alert, oriented to person, place, and time  Skin: warm & dry   Extremities: Edema: None    Cardiovascular: normal heart rate noted  Respiratory: normal respiratory effort, no distress  Abdomen: gravid, soft, non-tender  Pelvic: Cervical exam deferred         Fetal Status:     Movement: Absent    Fetal Surveillance Testing today: FHR 150   Chaperone: N/A    No results found for this or any previous visit (from the past 24 hours).  Assessment & Plan:  High-risk pregnancy: G3P1011 at [redacted]w[redacted]d with an Estimated Date of Delivery: 07/12/23      ICD-10-CM   1. Supervision of high risk pregnancy in second trimester  O09.92     2. [redacted] weeks gestation of pregnancy  Z3A.17     3. Genetic testing  Z13.79 INTEGRATED 2    4. Pap smear for cervical cancer screening  Z12.4 Cytology - PAP( [place])    5. Chronic hypertension affecting pregnancy: procardia  xl 30  O10.919        Meds:  Meds ordered this encounter  Medications   pantoprazole  (PROTONIX ) 40  MG tablet    Sig: Take 1 tablet (40 mg total) by mouth daily.    Dispense:  30 tablet    Refill:  11    Orders:  Orders Placed This Encounter  Procedures   INTEGRATED 2     Labs/procedures today: none  Treatment Plan:  sonogram 3 weeks    Follow-up: Return in about 3 weeks (around 02/22/2023) for HROB, keep scheduled.   Future Appointments  Date Time Provider Department Center  02/26/2023 10:00 AM [facility] US  [facility] None  02/26/2023 10:50 AM Ozan, Jennifer, DO [facility]    Orders Placed This Encounter  Procedures   INTEGRATED 2   Vonn VEAR Inch  Attending Physician for the Center for [facility] 02/01/2023 10:48 AM

## 2023-02-04 ENCOUNTER — Other Ambulatory Visit: Payer: Medicaid Other

## 2023-02-04 LAB — INTEGRATED 2
AFP MoM: 1.51
Alpha-Fetoprotein: 20.2 ng/mL
Crown Rump Length: 62.2 mm
DIA MoM: 0.75
DIA Value: 74.1 pg/mL
Estriol, Unconjugated: 0.48 ng/mL
Gest. Age on Collection Date: 12.4 wk
Gestational Age: 16.9 wk
Maternal Age at EDD: 28 a
Nuchal Translucency (NT): 1.7 mm
Nuchal Translucency MoM: 0.98
Number of Fetuses: 1
PAPP-A MoM: 0.4
PAPP-A Value: 104.1 ng/mL
Sonographer ID#: 309760
Test Results:: NEGATIVE
Weight: 393 [lb_av]
Weight: 393 [lb_av]
hCG MoM: 1.11
hCG Value: 13.1 [IU]/mL
uE3 MoM: 0.71

## 2023-02-05 ENCOUNTER — Telehealth: Payer: Self-pay | Admitting: Obstetrics & Gynecology

## 2023-02-05 LAB — CYTOLOGY - PAP
Chlamydia: NEGATIVE
Comment: NEGATIVE
Comment: NEGATIVE
Comment: NORMAL
Diagnosis: NEGATIVE
High risk HPV: NEGATIVE
Neisseria Gonorrhea: NEGATIVE

## 2023-02-05 NOTE — Telephone Encounter (Signed)
Patient calling about lab results. Please advise.

## 2023-02-06 ENCOUNTER — Other Ambulatory Visit: Payer: Medicaid Other

## 2023-02-06 ENCOUNTER — Encounter: Payer: Self-pay | Admitting: Obstetrics & Gynecology

## 2023-02-07 MED ORDER — METRONIDAZOLE 500 MG PO TABS: 500.0000 mg | ORAL_TABLET | Freq: Two times a day (BID) | ORAL | 1 refills | Status: DC

## 2023-02-21 ENCOUNTER — Encounter: Payer: Self-pay | Admitting: Obstetrics & Gynecology

## 2023-02-25 ENCOUNTER — Other Ambulatory Visit: Payer: Self-pay | Admitting: Obstetrics & Gynecology

## 2023-02-25 DIAGNOSIS — Z363 Encounter for antenatal screening for malformations: Secondary | ICD-10-CM

## 2023-02-26 ENCOUNTER — Encounter: Payer: Self-pay | Admitting: Obstetrics & Gynecology

## 2023-02-26 ENCOUNTER — Ambulatory Visit: Payer: Medicaid Other

## 2023-02-26 ENCOUNTER — Ambulatory Visit (INDEPENDENT_AMBULATORY_CARE_PROVIDER_SITE_OTHER): Payer: Medicaid Other | Admitting: Obstetrics & Gynecology

## 2023-02-26 VITALS — BP 154/100 | HR 82 | Wt 383.0 lb

## 2023-02-26 DIAGNOSIS — Z3A20 20 weeks gestation of pregnancy: Secondary | ICD-10-CM

## 2023-02-26 DIAGNOSIS — Z363 Encounter for antenatal screening for malformations: Secondary | ICD-10-CM | POA: Diagnosis not present

## 2023-02-26 DIAGNOSIS — Z98891 History of uterine scar from previous surgery: Secondary | ICD-10-CM

## 2023-02-26 DIAGNOSIS — O0992 Supervision of high risk pregnancy, unspecified, second trimester: Secondary | ICD-10-CM

## 2023-02-26 DIAGNOSIS — I10 Essential (primary) hypertension: Secondary | ICD-10-CM

## 2023-02-26 NOTE — Progress Notes (Signed)
[place] 20+4 wks,cephalic,anterior fundal placenta gr 0,SVP of fluid 7.2 cm,FHR 146 bpm,normal ovaries,EFW 392 g 68%,anatomy complete,limited view,no obvious abnormalities

## 2023-02-26 NOTE — Progress Notes (Signed)
HIGH-RISK PREGNANCY VISIT Patient name: XARENI KELCH MRN [id number]  Date of birth: [birth year] Chief Complaint:   Routine Prenatal Visit  History of Present Illness:   SAYWARD HORVATH is a 28 y.o. G72P1011 female at [redacted]w[redacted]d with an Estimated Date of Delivery: 07/12/23 being seen today for ongoing management of a high-risk pregnancy complicated by:  -Chronic HTN on procardia XL 30mg  daily -Repeat C-section -Morbid obesity- starting BMI 73  Today she reports no complaints.  She did have a small period of time where she did not have her BP medication and noted headaches.  She denies headaches currently.  Denies blurry vision  Contractions: Not present. Vag. Bleeding: None.  Movement: Present. denies leaking of fluid.      01/01/2023   11:22 AM 03/24/2020    2:32 PM 03/21/2017    2:18 PM 12/04/2016    2:28 PM  Depression screen PHQ 2/9  Decreased Interest 0 0 0 0  Down, Depressed, Hopeless 0 0 1 0  PHQ - 2 Score 0 0 1 0  Altered sleeping 0  2   Tired, decreased energy 0  2   Change in appetite 0  0   Feeling bad or failure about yourself  0  0   Trouble concentrating 0  0   Moving slowly or fidgety/restless 0  1   Suicidal thoughts 0  0   PHQ-9 Score 0  6      Current Outpatient Medications  Medication Instructions   aspirin EC 162 mg, Oral, Daily, Swallow whole.   metroNIDAZOLE (FLAGYL) 500 mg, Oral, 2 times daily   NIFEdipine (PROCARDIA-XL/NIFEDICAL-XL) 30 mg, Oral, Daily   pantoprazole (PROTONIX) 40 mg, Oral, Daily     Review of Systems:   Pertinent items are noted in HPI Denies abnormal vaginal discharge w/ itching/odor/irritation, headaches, visual changes, shortness of breath, chest pain, abdominal pain, severe nausea/vomiting, or problems with urination or bowel movements unless otherwise stated above. Pertinent History Reviewed:  Reviewed past medical,surgical, social, obstetrical and family history.  Reviewed problem list, medications and allergies. Physical  Assessment:   Vitals:   02/26/23 1100  BP: (!) 147/98  Pulse: 82  Weight: (!) 383 lb (173.7 kg)  Body mass index is 70.05 kg/m.           Physical Examination:   General appearance: alert, well appearing, and in no distress  Mental status: normal mood, behavior, speech, dress, motor activity, and thought processes  Skin: slightly diaphoretic   Cardiovascular: normal heart rate noted  Respiratory: normal respiratory effort, no distress  Abdomen: gravid, soft, non-tender  Pelvic: Cervical exam deferred         Fetal Status:     Movement: Present    Fetal Surveillance Testing today: anatomy scan -cephalic,anterior fundal placenta gr 0,SVP of fluid 7.2 cm,FHR 146 bpm,normal ovaries,EFW 392 g 68%,anatomy complete,limited view,no obvious abnormalities   Chaperone: N/A    No results found for this or any previous visit (from the past 24 hours).   Assessment & Plan:  High-risk pregnancy: G3P1011 at [redacted]w[redacted]d with an Estimated Date of Delivery: 07/12/23   1) Chronic HTN with h/o severe preE -Elevated BP noted today, patient reports she has had a "rough "morning -Encourage patient to check BP at home and keep a log -Plan for 1 week BP check -Referral to OB cards -growth q 4wks -antepartum testing @ 32wks  2) Prior C-section Desires repeat  3) morbid obesity -Reviewed her current weight loss and congratulated patient  on making positive lifestyle changes-she has been more active and quit all sodas -Reassured patient that her weight is not a reflection of the baby's weight -Reviewed "healthy mom, healthy baby"  Meds: No orders of the defined types were placed in this encounter.   Labs/procedures today: anatomy scan  Treatment Plan:  routine OB care and as outlined above  Reviewed: Preterm labor symptoms and general obstetric precautions including but not limited to vaginal bleeding, contractions, leaking of fluid and fetal movement were reviewed in detail with the patient.  All  questions were answered. Pt has home bp cuff. Check bp weekly, let us know if >140/90.   Follow-up: Return in about 4 weeks (around 03/26/2023) for HROB visit and growth scan every 4 wks.   No future appointments.  Orders Placed This Encounter  Procedures   US OB Follow Up    Myna Hidalgo, DO Attending Obstetrician & Gynecologist, [facility] for [employer], [facility]

## 2023-03-06 ENCOUNTER — Ambulatory Visit: Payer: Medicaid Other | Admitting: *Deleted

## 2023-03-06 DIAGNOSIS — Z0130 Encounter for examination of blood pressure without abnormal findings: Secondary | ICD-10-CM

## 2023-03-06 NOTE — Progress Notes (Addendum)
   NURSE VISIT- BLOOD PRESSURE CHECK  SUBJECTIVE:  Anna [name] is a 28 y.o. G85P1011 female here for BP check. She is [redacted]w[redacted]d pregnant    HYPERTENSION ROS:  Pregnant/postpartum:  Severe headaches that don't go away with tylenol /other medicines: No  Visual changes (seeing spots/double/blurred vision) No  Severe pain under right breast breast or in center of upper chest No  Severe nausea/vomiting No  Taking medicines as instructed yes    OBJECTIVE:  BP 123/82   Pulse 75   LMP 10/05/2022   Breastfeeding No   Appearance alert, well appearing, and in no distress.  ASSESSMENT: Pregnancy [redacted]w[redacted]d  blood pressure check  PLAN: Discussed with Dr. Ozan   Recommendations: no changes needed   Follow-up: as scheduled   Alan LITTIE Fischer  03/06/2023 11:16 AM

## 2023-03-25 ENCOUNTER — Ambulatory Visit: Payer: Medicaid Other | Admitting: Obstetrics & Gynecology

## 2023-03-25 ENCOUNTER — Ambulatory Visit: Payer: Medicaid Other

## 2023-03-25 ENCOUNTER — Encounter: Payer: Self-pay | Admitting: Obstetrics & Gynecology

## 2023-03-25 VITALS — BP 140/88 | HR 80 | Wt 391.2 lb

## 2023-03-25 DIAGNOSIS — Z3A24 24 weeks gestation of pregnancy: Secondary | ICD-10-CM | POA: Diagnosis not present

## 2023-03-25 DIAGNOSIS — Z98891 History of uterine scar from previous surgery: Secondary | ICD-10-CM

## 2023-03-25 DIAGNOSIS — O0992 Supervision of high risk pregnancy, unspecified, second trimester: Secondary | ICD-10-CM

## 2023-03-25 DIAGNOSIS — O10912 Unspecified pre-existing hypertension complicating pregnancy, second trimester: Secondary | ICD-10-CM

## 2023-03-25 DIAGNOSIS — O10919 Unspecified pre-existing hypertension complicating pregnancy, unspecified trimester: Secondary | ICD-10-CM

## 2023-03-25 DIAGNOSIS — I10 Essential (primary) hypertension: Secondary | ICD-10-CM | POA: Diagnosis not present

## 2023-03-25 NOTE — Progress Notes (Signed)
 HIGH-RISK PREGNANCY VISIT Patient name: Anna [name] MRN [id number]  Date of birth: [birth year] Chief Complaint:   Routine Prenatal Visit  History of Present Illness:   Anna [name] is a 28 y.o. G51P1011 female at [redacted]w[redacted]d with an Estimated Date of Delivery: 07/12/23 being seen today for ongoing management of a high-risk pregnancy complicated by    ICD-10-CM   1. Supervision of high risk pregnancy in second trimester  O09.92     2. Chronic hypertension affecting pregnancy: procardia xl 30  O10.919     3. History of cesarean delivery  Z98.891     4. Obesity, morbid, BMI 50 or higher (HCC)  E66.01      .    Today she reports no complaints. Contractions: Not present. Vag. Bleeding: None.  Movement: Present. denies leaking of fluid.      01/01/2023   11:22 AM 03/24/2020    2:32 PM 03/21/2017    2:18 PM 12/04/2016    2:28 PM  Depression screen PHQ 2/9  Decreased Interest 0 0 0 0  Down, Depressed, Hopeless 0 0 1 0  PHQ - 2 Score 0 0 1 0  Altered sleeping 0  2   Tired, decreased energy 0  2   Change in appetite 0  0   Feeling bad or failure about yourself  0  0   Trouble concentrating 0  0   Moving slowly or fidgety/restless 0  1   Suicidal thoughts 0  0   PHQ-9 Score 0  6         01/01/2023   11:22 AM  GAD 7 : Generalized Anxiety Score  Nervous, Anxious, on Edge 0  Control/stop worrying 1  Worry too much - different things 1  Trouble relaxing 1  Restless 0  Easily annoyed or irritable 0  Afraid - awful might happen 1  Total GAD 7 Score 4     Review of Systems:   Pertinent items are noted in HPI Denies abnormal vaginal discharge w/ itching/odor/irritation, headaches, visual changes, shortness of breath, chest pain, abdominal pain, severe nausea/vomiting, or problems with urination or bowel movements unless otherwise stated above. Pertinent History Reviewed:  Reviewed past medical,surgical, social, obstetrical and family history.  Reviewed problem list,  medications and allergies. Physical Assessment:   Vitals:   03/25/23 1043  BP: (!) 140/88  Pulse: 80  Weight: (!) 391 lb 3.2 oz (177.4 kg)  Body mass index is 71.55 kg/m.           Physical Examination:   General appearance: alert, well appearing, and in no distress  Mental status: alert, oriented to person, place, and time  Skin: warm & dry   Extremities: Edema: None    Cardiovascular: normal heart rate noted  Respiratory: normal respiratory effort, no distress  Abdomen: gravid, soft, non-tender  Pelvic: Cervical exam deferred         Fetal Status:     Movement: Present    Fetal Surveillance Testing today: EFW 72%   Chaperone: N/A    No results found for this or any previous visit (from the past 24 hours).  Assessment & Plan:  High-risk pregnancy: G3P1011 at [redacted]w[redacted]d with an Estimated Date of Delivery: 07/12/23      ICD-10-CM   1. Supervision of high risk pregnancy in second trimester  O09.92     2. Chronic hypertension affecting pregnancy: procardia xl 30  O10.919     3. History of cesarean delivery  [phone]  4. Obesity, morbid, BMI 50 or higher (HCC)  E66.01         Meds: No orders of the defined types were placed in this encounter.   Orders: No orders of the defined types were placed in this encounter.    Labs/procedures today: U/S  Treatment Plan:  keep scheduled   Follow-up: Return for keep scheduled, PN2.   Future Appointments  Date Time Provider Department Center  04/02/2023  2:00 PM Thomasene Ripple, DO [facility] None  04/22/2023 10:45 AM [facility] 2 [facility] None  04/22/2023 11:50 AM Cheral Marker, CNM [facility]  05/20/2023 10:45 AM [facility] None  05/20/2023 11:50 AM Cheral Marker, CNM [facility]  05/23/2023 10:10 AM [facility] NURSE [facility]  05/27/2023  2:15 PM [facility] None  05/27/2023  3:30 PM Myna Hidalgo, DO [facility]  05/30/2023 10:10 AM [facility] NURSE [facility]   06/03/2023 10:00 AM [facility] [place] [facility] None  06/06/2023 10:10 AM [facility] NURSE [facility]  06/10/2023 10:00 AM [facility] [place] [facility] None  06/13/2023 10:10 AM [facility] N[facility]  06/17/2023 10:45 AM [facility] 2 [facility] None  06/20/2023 10:10 AM [facility] N[facility]  06/25/2023 10:00 AM [facility] [place] [facility] None  06/28/2023 10:10 AM [facility] N[facility]SE [facility]  07/01/2023 10:00 AM [facility] [place] [facility] None  07/04/2023 10:10 AM [facility] N[facility]SE [facility]  07/08/2023 10:00 AM [facility] [place] [facility] None  07/11/2023 10:10 AM [facility] NURSE [facility]    No orders of the defined types were placed in this encounter.  Lazaro Arms  Attending Physician for the Center for [facility] 03/25/2023 12:13 PM

## 2023-03-25 NOTE — Progress Notes (Signed)
 [place] 24+3 wks,cephalic,anterior fundal placenta gr 0,cx 4.8 cm,polyhydramnios,AFI 28 cm,SVP of fluid 8 cm,FHR 148 bpm,EFW 776 g 72%,limited view because of pt body habitus

## 2023-03-28 ENCOUNTER — Other Ambulatory Visit: Payer: Medicaid Other

## 2023-03-28 DIAGNOSIS — O0991 Supervision of high risk pregnancy, unspecified, first trimester: Secondary | ICD-10-CM | POA: Diagnosis not present

## 2023-03-28 DIAGNOSIS — Z3A13 13 weeks gestation of pregnancy: Secondary | ICD-10-CM | POA: Diagnosis not present

## 2023-03-28 DIAGNOSIS — Z131 Encounter for screening for diabetes mellitus: Secondary | ICD-10-CM | POA: Diagnosis not present

## 2023-03-28 DIAGNOSIS — R7309 Other abnormal glucose: Secondary | ICD-10-CM

## 2023-03-28 DIAGNOSIS — Z3A24 24 weeks gestation of pregnancy: Secondary | ICD-10-CM

## 2023-03-29 LAB — GLUCOSE TOLERANCE, 2 HOURS W/ 1HR
Glucose, 1 hour: 176 mg/dL (ref 70–179)
Glucose, 2 hour: 130 mg/dL (ref 70–152)
Glucose, Fasting: 110 mg/dL — ABNORMAL HIGH (ref 70–91)

## 2023-04-01 ENCOUNTER — Encounter: Payer: Self-pay | Admitting: Women's Health

## 2023-04-01 ENCOUNTER — Other Ambulatory Visit: Payer: Self-pay | Admitting: Women's Health

## 2023-04-01 DIAGNOSIS — O24419 Gestational diabetes mellitus in pregnancy, unspecified control: Secondary | ICD-10-CM | POA: Insufficient documentation

## 2023-04-01 DIAGNOSIS — Z8632 Personal history of gestational diabetes: Secondary | ICD-10-CM | POA: Insufficient documentation

## 2023-04-01 DIAGNOSIS — O24410 Gestational diabetes mellitus in pregnancy, diet controlled: Secondary | ICD-10-CM

## 2023-04-01 MED ORDER — ACCU-CHEK SOFTCLIX LANCETS MISC: 12 refills | Status: DC

## 2023-04-01 MED ORDER — ACCU-CHEK GUIDE ME W/DEVICE KIT: 1.0000 | PACK | Freq: Four times a day (QID) | 0 refills | Status: DC

## 2023-04-01 MED ORDER — GLUCOSE BLOOD VI STRP: ORAL_STRIP | 12 refills | Status: DC

## 2023-04-02 ENCOUNTER — Ambulatory Visit: Payer: Medicaid Other | Admitting: Cardiology

## 2023-04-11 ENCOUNTER — Ambulatory Visit: Admitting: Dietician

## 2023-04-11 ENCOUNTER — Other Ambulatory Visit: Payer: Self-pay

## 2023-04-11 ENCOUNTER — Encounter: Attending: Nurse Practitioner | Admitting: Dietician

## 2023-04-11 DIAGNOSIS — Z713 Dietary counseling and surveillance: Secondary | ICD-10-CM | POA: Insufficient documentation

## 2023-04-11 DIAGNOSIS — Z3A26 26 weeks gestation of pregnancy: Secondary | ICD-10-CM

## 2023-04-11 DIAGNOSIS — O24419 Gestational diabetes mellitus in pregnancy, unspecified control: Secondary | ICD-10-CM

## 2023-04-11 DIAGNOSIS — O24410 Gestational diabetes mellitus in pregnancy, diet controlled: Secondary | ICD-10-CM | POA: Diagnosis not present

## 2023-04-11 NOTE — Progress Notes (Signed)
 Patient was seen for Gestational Diabetes on 04/11/2023  Start time 1625 and End time 1620   Estimated due date: 07/12/2023; [redacted]w[redacted]d  Clinical: Medications:  Current Outpatient Medications:    aspirin EC 81 MG tablet, Take 2 tablets (162 mg total) by mouth daily. Swallow whole., Disp: 180 tablet, Rfl: 2   NIFEdipine (PROCARDIA-XL/NIFEDICAL-XL) 30 MG 24 hr tablet, Take 1 tablet (30 mg total) by mouth daily., Disp: 90 tablet, Rfl: 3   pantoprazole (PROTONIX) 40 MG tablet, Take 1 tablet (40 mg total) by mouth daily., Disp: 30 tablet, Rfl: 11   Prenatal MV & Min w/FA-DHA (PRENATAL GUMMIES) 0.18-25 MG CHEW, Chew by mouth., Disp: , Rfl:    Accu-Chek Softclix Lancets lancets, Use as instructed to check blood sugar 4 times daily, Disp: 100 each, Rfl: 12   Blood Glucose Monitoring Suppl (ACCU-CHEK GUIDE ME) w/Device KIT, 1 each by Does not apply route 4 (four) times daily., Disp: 1 kit, Rfl: 0   glucose blood test strip, Use as instructed to check blood sugar four times daily, Disp: 100 each, Rfl: 12  Medical History:  Past Medical History:  Diagnosis Date   Allergy    GERD (gastroesophageal reflux disease)    Hypertension    Morbid obesity with BMI of 70 and over, adult (HCC) 09/29/2017   with hypertension     Labs: OGTT fasting 100, 1 hour 176, 130 mg/dL  Lab Results  Component Value Date   HGBA1C 5.8 (H) 01/01/2023     Dietary and Lifestyle History: Pt presents today alone. Pt reports a plan to pick up her glucometer and supplies from pharmacy today. Pt reports she lives with her parents, 57 yo daughter and states she does her own shopping and states cooking is between her and her father. Pt denies previous GDM. Pt reports daily intake of sugary sweetened beverages and a willingness to omit to improve blood sugar control. 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 Pt reports a family history of diabetes. All Pt's questions were answered during this encounter.     Physical Activity:  walks at work, walking 4/d/w for 60 minutes Stress: 0 out of 10 /self care includes: walk, music Sleep: good     24 hr Recall:  First Meal: boiled egg, chicken, water or 1 white toast, eggs, juice or frosted falkes or cheerios cereal, 2%milk Snack:  Second meal: 2 slices of white bread, mustard, [place], water Snack: apple and peanut butter or pineapple Third meal: chicken, 2 cuties, green beans, ~ 1 cup juice Snack: grapes  Beverages: juice, 1/2 cup of regular sprite, water, milk  NUTRITION INTERVENTION  Nutrition education (E-1) on the following topics:   Initial Follow-up  [x]  []  Definition of Gestational Diabetes [x]  []  Why dietary management is important in controlling blood glucose [x]  []  Effects each nutrient has on blood glucose levels [x]  []  Simple carbohydrates vs complex carbohydrates [x]  []  Fluid intake [x]  []  Creating a balanced meal plan [x]  []  Carbohydrate counting  [x]  []  When to check blood glucose levels [x]  []  Proper blood glucose monitoring techniques [x]  []  Effect of stress and stress reduction techniques  [x]  []  Exercise effect on blood glucose levels, appropriate exercise during pregnancy [x]  []  Importance of limiting caffeine and abstaining from alcohol and smoking [x]  []  Medications used for blood sugar control during pregnancy [x]  []  Hypoglycemia and rule of 15 [x]  []  Postpartum self care    Patient has a meter prior to visit. Patient is instructed  to begin testing pre breakfast and 2 hours after each meal. CBG: 78 mg/dL, reported as pre meal   Patient instructed to monitor glucose levels: QID FBS: 60 - <= 95 mg/dL; 2 hour: <= 161 mg/dL  Patient received handouts: Nutrition Diabetes and Pregnancy Carbohydrate Counting List Blood glucose log Snack ideas for diabetes during pregnancy  Patient will be seen for follow-up as needed.

## 2023-04-22 ENCOUNTER — Encounter: Payer: Self-pay | Admitting: Women's Health

## 2023-04-22 ENCOUNTER — Other Ambulatory Visit: Payer: Medicaid Other

## 2023-04-22 ENCOUNTER — Ambulatory Visit: Payer: Medicaid Other | Admitting: Radiology

## 2023-04-22 ENCOUNTER — Ambulatory Visit: Payer: Medicaid Other | Admitting: Women's Health

## 2023-04-22 VITALS — BP 132/84 | HR 86 | Wt 389.0 lb

## 2023-04-22 DIAGNOSIS — I10 Essential (primary) hypertension: Secondary | ICD-10-CM | POA: Diagnosis not present

## 2023-04-22 DIAGNOSIS — Z1389 Encounter for screening for other disorder: Secondary | ICD-10-CM | POA: Diagnosis not present

## 2023-04-22 DIAGNOSIS — Z3A28 28 weeks gestation of pregnancy: Secondary | ICD-10-CM | POA: Diagnosis not present

## 2023-04-22 DIAGNOSIS — Z331 Pregnant state, incidental: Secondary | ICD-10-CM

## 2023-04-22 DIAGNOSIS — O24419 Gestational diabetes mellitus in pregnancy, unspecified control: Secondary | ICD-10-CM | POA: Diagnosis not present

## 2023-04-22 DIAGNOSIS — Z6841 Body Mass Index (BMI) 40.0 and over, adult: Secondary | ICD-10-CM | POA: Diagnosis not present

## 2023-04-22 DIAGNOSIS — R350 Frequency of micturition: Secondary | ICD-10-CM | POA: Diagnosis not present

## 2023-04-22 DIAGNOSIS — Z23 Encounter for immunization: Secondary | ICD-10-CM

## 2023-04-22 DIAGNOSIS — O0991 Supervision of high risk pregnancy, unspecified, first trimester: Secondary | ICD-10-CM | POA: Diagnosis not present

## 2023-04-22 DIAGNOSIS — O0993 Supervision of high risk pregnancy, unspecified, third trimester: Secondary | ICD-10-CM

## 2023-04-22 DIAGNOSIS — O10913 Unspecified pre-existing hypertension complicating pregnancy, third trimester: Secondary | ICD-10-CM

## 2023-04-22 DIAGNOSIS — O10919 Unspecified pre-existing hypertension complicating pregnancy, unspecified trimester: Secondary | ICD-10-CM

## 2023-04-22 DIAGNOSIS — R1011 Right upper quadrant pain: Secondary | ICD-10-CM | POA: Diagnosis not present

## 2023-04-22 DIAGNOSIS — O0992 Supervision of high risk pregnancy, unspecified, second trimester: Secondary | ICD-10-CM

## 2023-04-22 LAB — POCT URINALYSIS DIPSTICK OB
Blood, UA: NEGATIVE
Glucose, UA: NEGATIVE
Ketones, UA: NEGATIVE
Leukocytes, UA: NEGATIVE

## 2023-04-22 MED ORDER — NITROFURANTOIN MONOHYD MACRO 100 MG PO CAPS: 100.0000 mg | ORAL_CAPSULE | Freq: Two times a day (BID) | ORAL | 0 refills | Status: DC

## 2023-04-22 NOTE — Progress Notes (Signed)
 [place]:  GA = 28+3 weeks Single active female fetus, cephalic, FHR = 162 bpm,  anterior pl, gr1 AFI = 13.7 cm,  40%, MVP = 5.3 cm,  EFW 63%, 1340g

## 2023-04-22 NOTE — Progress Notes (Signed)
 HIGH-RISK PREGNANCY VISIT Patient name: Anna [name] MRN [id number]  Date of birth: 30-Jun-1995 Chief Complaint:   Routine Prenatal Visit and Pregnancy Ultrasound  History of Present Illness:   Anna [name] is a 28 y.o. G43P1011 female at [redacted]w[redacted]d with an Estimated Date of Delivery: 07/12/23 being seen today for ongoing management of a high-risk pregnancy complicated by chronic hypertension currently on nifedipine 30mg  daily, gestational diabetes mellitus, and PG BMI 73.    Today she reports  hasn't gotten supplies to check sugars, plans to go today. Did go to dietician, has started counting carbs .  RUQ pain since yesterday, some nausea, no vomiting, not worse w/ foods that she can tell. Contractions: Not present.  .  Movement: Present. denies leaking of fluid.      01/01/2023   11:22 AM 03/24/2020    2:32 PM 03/21/2017    2:18 PM 12/04/2016    2:28 PM  Depression screen PHQ 2/9  Decreased Interest 0 0 0 0  Down, Depressed, Hopeless 0 0 1 0  PHQ - 2 Score 0 0 1 0  Altered sleeping 0  2   Tired, decreased energy 0  2   Change in appetite 0  0   Feeling bad or failure about yourself  0  0   Trouble concentrating 0  0   Moving slowly or fidgety/restless 0  1   Suicidal thoughts 0  0   PHQ-9 Score 0  6         01/01/2023   11:22 AM  GAD 7 : Generalized Anxiety Score  Nervous, Anxious, on Edge 0  Control/stop worrying 1  Worry too much - different things 1  Trouble relaxing 1  Restless 0  Easily annoyed or irritable 0  Afraid - awful might happen 1  Total GAD 7 Score 4     Review of Systems:   Pertinent items are noted in HPI Denies abnormal vaginal discharge w/ itching/odor/irritation, headaches, visual changes, shortness of breath, chest pain, abdominal pain, severe nausea/vomiting, or problems with urination or bowel movements unless otherwise stated above. Pertinent History Reviewed:  Reviewed past medical,surgical, social, obstetrical and family history.   Reviewed problem list, medications and allergies. Physical Assessment:   Vitals:   04/22/23 1149  BP: 132/84  Pulse: 86  Weight: (!) 389 lb (176.4 kg)  Body mass index is 71.15 kg/m.           Physical Examination:   General appearance: alert, well appearing, and in no distress  Mental status: alert, oriented to person, place, and time  Skin: warm & dry   Extremities: Edema: None    Cardiovascular: normal heart rate noted  Respiratory: normal respiratory effort, no distress  Abdomen: gravid, soft, +RUQ tenderness  Pelvic: Cervical exam deferred         Fetal Status:     Movement: Present    Fetal Surveillance Testing today:  [place]:  GA = 28+3 weeks Single active female fetus, cephalic, FHR = 162 bpm,  anterior pl, gr1 AFI = 13.7 cm,  40%, MVP = 5.3 cm,  EFW 63%, 1340g   Chaperone: N/A  Results for orders placed or performed in visit on 04/22/23 (from the past 24 hours)  POC Urinalysis Dipstick OB   Collection Time: 04/22/23 11:59 AM  Result Value Ref Range   Color, UA     Clarity, UA     Glucose, UA Negative Negative   Bilirubin, UA     Ketones,  UA negative    Spec Grav, UA     Blood, UA negative    pH, UA     POC,PROTEIN,UA Trace Negative, Trace, Small (1+), Moderate (2+), Large (3+), 4+   Urobilinogen, UA     Nitrite, UA postive    Leukocytes, UA Negative Negative   Appearance     Odor      Assessment & Plan:  High-risk pregnancy: G3P1011 at [redacted]w[redacted]d with an Estimated Date of Delivery: 07/12/23   1) CHTN w/ h/o severe pre-e, stable on nifedipine 30mg , ASA, reviewed pre-e s/s, reasons to seek care  2) GDM, dx @ 24w, hasn't gotten supplies-getting today- discussed QID testing/log, met w/ dietician and counting carbs  3) PG BMI 73> currently 71  4) RUQ pain> since yesterday, some nausea, no vomiting, no correlation to food she's aware of. Check CMP today. Let us know/seek care if worsens or new sx  5) +nitrites> some increased frequency, rx macrobid, send urine  cx  Meds:  Meds ordered this encounter  Medications   nitrofurantoin, macrocrystal-monohydrate, (MACROBID) 100 MG capsule    Sig: Take 1 capsule (100 mg total) by mouth 2 (two) times daily. X 7 days    Dispense:  14 capsule    Refill:  0    Labs/procedures today: tdap, pn2 (minus GTT), CMP, urine cx  Treatment Plan:   EFW q 4w    2x/wk testing nst/sono @ 32wks     Deliver 37-39.0wks (or as per MFM w/ poor control)____   Reviewed: Preterm labor symptoms and general obstetric precautions including but not limited to vaginal bleeding, contractions, leaking of fluid and fetal movement were reviewed in detail with the patient.  All questions were answered. Does have home bp cuff. Office bp cuff given: not applicable. Check bp weekly, let us know if consistently >140 and/or >90.  Follow-up: Return in about 2 weeks (around 05/06/2023) for HROB, MD, in person.   Future Appointments  Date Time Provider Department Center  05/06/2023 11:50 AM Lazaro Arms, MD [facility]  05/17/2023  1:40 PM Thomasene Ripple, DO [facility] None  05/20/2023 10:45 AM [facility] None  05/20/2023 11:50 AM Cheral Marker, CNM [facility]  05/23/2023 10:10 AM [facility] NURSE [facility]  05/27/2023  2:15 PM [facility] None  05/27/2023  3:30 PM Myna Hidalgo, DO [facility]  05/30/2023 10:10 AM [facility] NURSE [facility]  06/03/2023 10:00 AM [facility] [place] [facility] None  06/03/2023 10:50 AM Cheral Marker, CNM [facility]  06/06/2023 10:10 AM [facility] NURSE [facility]  06/10/2023 10:00 AM [facility] [place] [facility] None  06/10/2023 10:50 AM Myna Hidalgo, DO [facility]  06/13/2023 10:10 AM [facility] NURSE [facility]  06/17/2023 10:45 AM [facility] None  06/17/2023 11:50 AM Cheral Marker, CNM [facility]  06/20/2023 10:10 AM [facility] N[facility]  06/25/2023 10:00 AM [facility] [place] [facility] None  06/25/2023 10:50 AM Cheral Marker, CNM [facility]  06/28/2023 10:10 AM [facility] NURSE [facility]  07/01/2023 10:00 AM [facility] [place] [facility] None  07/04/2023 10:10 AM [facility] N[facility]SE [facility]  07/08/2023 10:00 AM [facility] [place] [facility] None  07/11/2023 10:10 AM [facility] N[facility]SE [facility]    Orders Placed This Encounter  Procedures   Urine Culture   Tdap vaccine greater than or equal to 7yo IM   CBC   HIV Antibody (  routine testing w rflx)   RPR   Comprehensive metabolic panel   POC Urinalysis Dipstick OB   Antibody screen   Cheral Marker CNM, [facility] 04/22/2023 12:30 PM

## 2023-04-22 NOTE — Patient Instructions (Addendum)
 Anna [name], thank you for choosing our office today! We appreciate the opportunity to meet your healthcare needs. You may receive a short survey by mail, e-mail, or through [employer]. If you are happy with your care we would appreciate if you could take just a few minutes to complete the survey questions. We read all of your comments and take your feedback very seriously. Thank you again for choosing our office.  Center for [employer] Team at [facility]  [facility] at [facility] ([address] [place], [place] [address]) Entrance C, located off of [address] Free 24/7 valet parking   CHECK SUGARS 4 TIMES DAILY Check blood sugars 4 times a day: in the morning before eating/drinking anything (goal is <95) and 2 hours after your first bite of breakfast, lunch, and supper (goal is <120). Please keep a log (Example 06/27/20: 95, 120, 120, 120) and bring to each appointment.   CLASSES: Go to [email or web address] to register for classes (childbirth, breastfeeding, waterbirth, infant CPR, daddy bootcamp, etc.)  Call the office [phone]) or go to [facility] if: You begin to have strong, frequent contractions Your water breaks.  Sometimes it is a big gush of fluid, sometimes it is just a trickle that keeps getting your panties wet or running down your legs You have vaginal bleeding.  It is normal to have a small amount of spotting if your cervix was checked.  You don't feel your baby moving like normal.  If you don't, get you something to eat and drink and lay down and focus on feeling your baby move.   If your baby is still not moving like normal, you should call the office or go to [facility].  Call the office [phone]) or go to [facility] hospital for these signs of pre-eclampsia: Severe headache that does not go away with Tylenol Visual changes- seeing spots, double, blurred vision Pain under your right breast or upper abdomen that does not go away with Tums or  heartburn medicine Nausea and/or vomiting Severe swelling in your hands, feet, and face   Tdap Vaccine It is recommended that you get the Tdap vaccine during the third trimester of EACH pregnancy to help protect your baby from getting pertussis (whooping cough) 27-36 weeks is the BEST time to do this so that you can pass the protection on to your baby. During pregnancy is better than after pregnancy, but if you are unable to get it during pregnancy it will be offered at the hospital.  You can get this vaccine with [place], at the health department, your family doctor, or some local pharmacies Everyone who will be around your baby should also be up-to-date on their vaccines before the baby comes. Adults (who are not pregnant) only need 1 dose of Tdap during adulthood.   [facility] Pediatricians/Family Doctors [facility]): [address] Colette Ribas, [phone]           [facility]: [address], [phone]                [facility]): [address], [phone] (call to ask if accepting patients) [facility] Department: [address], [place], [phone]    [facility] Pediatricians/Family Doctors [facility]): [phone] [address], [phone] [facility]: [address] [place], [phone] [facility]: [address], [phone]  [facility] Doctors  [facility]):  [phone] [facility]: [address] [phone]   [facility] Doctors [facility]: [address], [phone]  [facility] Doctors  [facility]: [phone], [phone]  Home Blood Pressure Monitoring for Patients   Your provider has recommended that you check your blood pressure (BP) at least once a week at home. If you do not have a blood pressure cuff at home, one will be  provided for you. Contact your provider if you have not received your monitor within 1 week.   Helpful Tips for Accurate Home Blood Pressure Checks  Don't smoke, exercise, or drink caffeine 30 minutes before checking your BP Use the restroom before checking your BP (a full bladder can raise your pressure) Relax in a comfortable upright chair Feet on the ground Left arm resting comfortably on a flat surface at the level of your heart Legs uncrossed Back supported Sit quietly and don't talk Place the cuff on your bare arm Adjust snuggly, so that only two fingertips can fit between your skin and the top of the cuff Check 2 readings separated by at least one minute Keep a log of your BP readings For a visual, please reference this diagram: http://ccnc.care/bpdiagram  Provider Name: [facility]     Phone: [phone]  Zone 1: ALL CLEAR  Continue to monitor your symptoms:  BP reading is less than 140 (top number) or less than 90 (bottom number)  No right upper stomach pain No headaches or seeing spots No feeling nauseated or throwing up No swelling in face and hands  Zone 2: CAUTION Call your doctor's office for any of the following:  BP reading is greater than 140 (top number) or greater than 90 (bottom number)  Stomach pain under your ribs in the middle or right side Headaches or seeing spots Feeling nauseated or throwing up Swelling in face and hands  Zone 3: EMERGENCY  Seek immediate medical care if you have any of the following:  BP reading is greater than160 (top number) or greater than 110 (bottom number) Severe headaches not improving with Tylenol Serious difficulty catching your breath Any worsening symptoms from Zone 2   Third Trimester of Pregnancy The third trimester is from week 29 through week 42, months 7 through 9. The third trimester is a time when the fetus is growing rapidly. At the end of the ninth month, the fetus is about 20 inches in length and  weighs 6-10 pounds.  BODY CHANGES Your body goes through many changes during pregnancy. The changes vary from woman to woman.  Your weight will continue to increase. You can expect to gain 25-35 pounds (11-16 kg) by the end of the pregnancy. You may begin to get stretch marks on your hips, abdomen, and breasts. You may urinate more often because the fetus is moving lower into your pelvis and pressing on your bladder. You may develop or continue to have heartburn as a result of your pregnancy. You may develop constipation because certain hormones are causing the muscles that push waste through your intestines to slow down. You may develop hemorrhoids or swollen, bulging veins (varicose veins). You may have pelvic pain because of the weight gain and pregnancy hormones relaxing your joints between the bones in your pelvis. Backaches may result from overexertion of the muscles supporting your posture. You may have changes in your hair. These can include thickening of your hair, rapid growth, and changes in texture. Some women also have  hair loss during or after pregnancy, or hair that feels dry or thin. Your hair will most likely return to normal after your baby is born. Your breasts will continue to grow and be tender. A yellow discharge may leak from your breasts called colostrum. Your belly button may stick out. You may feel short of breath because of your expanding uterus. You may notice the fetus "dropping," or moving lower in your abdomen. You may have a bloody mucus discharge. This usually occurs a few days to a week before labor begins. Your cervix becomes thin and soft (effaced) near your due date. WHAT TO EXPECT AT YOUR PRENATAL EXAMS  You will have prenatal exams every 2 weeks until week 36. Then, you will have weekly prenatal exams. During a routine prenatal visit: You will be weighed to make sure you and the fetus are growing normally. Your blood pressure is taken. Your abdomen will be  measured to track your baby's growth. The fetal heartbeat will be listened to. Any test results from the previous visit will be discussed. You may have a cervical check near your due date to see if you have effaced. At around 36 weeks, your caregiver will check your cervix. At the same time, your caregiver will also perform a test on the secretions of the vaginal tissue. This test is to determine if a type of bacteria, Group B streptococcus, is present. Your caregiver will explain this further. Your caregiver may ask you: What your birth plan is. How you are feeling. If you are feeling the baby move. If you have had any abnormal symptoms, such as leaking fluid, bleeding, severe headaches, or abdominal cramping. If you have any questions. Other tests or screenings that may be performed during your third trimester include: Blood tests that check for low iron levels (anemia). Fetal testing to check the health, activity level, and growth of the fetus. Testing is done if you have certain medical conditions or if there are problems during the pregnancy. FALSE LABOR You may feel small, irregular contractions that eventually go away. These are called Braxton Hicks contractions, or false labor. Contractions may last for hours, days, or even weeks before true labor sets in. If contractions come at regular intervals, intensify, or become painful, it is best to be seen by your caregiver.  SIGNS OF LABOR  Menstrual-like cramps. Contractions that are 5 minutes apart or less. Contractions that start on the top of the uterus and spread down to the lower abdomen and back. A sense of increased pelvic pressure or back pain. A watery or bloody mucus discharge that comes from the vagina. If you have any of these signs before the 37th week of pregnancy, call your caregiver right away. You need to go to the hospital to get checked immediately. HOME CARE INSTRUCTIONS  Avoid all smoking, herbs, alcohol, and  unprescribed drugs. These chemicals affect the formation and growth of the baby. Follow your caregiver's instructions regarding medicine use. There are medicines that are either safe or unsafe to take during pregnancy. Exercise only as directed by your caregiver. Experiencing uterine cramps is a good sign to stop exercising. Continue to eat regular, healthy meals. Wear a good support bra for breast tenderness. Do not use hot tubs, steam rooms, or saunas. Wear your seat belt at all times when driving. Avoid raw meat, uncooked cheese, cat litter boxes, and soil used by cats. These carry germs that can cause birth defects in the baby. Take your prenatal vitamins. Try taking  a stool softener (if your caregiver approves) if you develop constipation. Eat more high-fiber foods, such as fresh vegetables or fruit and whole grains. Drink plenty of fluids to keep your urine clear or pale yellow. Take warm sitz baths to soothe any pain or discomfort caused by hemorrhoids. Use hemorrhoid cream if your caregiver approves. If you develop varicose veins, wear support hose. Elevate your feet for 15 minutes, 3-4 times a day. Limit salt in your diet. Avoid heavy lifting, wear low heal shoes, and practice good posture. Rest a lot with your legs elevated if you have leg cramps or low back pain. Visit your dentist if you have not gone during your pregnancy. Use a soft toothbrush to brush your teeth and be gentle when you floss. A sexual relationship may be continued unless your caregiver directs you otherwise. Do not travel far distances unless it is absolutely necessary and only with the approval of your caregiver. Take prenatal classes to understand, practice, and ask questions about the labor and delivery. Make a trial run to the hospital. Pack your hospital bag. Prepare the baby's nursery. Continue to go to all your prenatal visits as directed by your caregiver. SEEK MEDICAL CARE IF: You are unsure if you are  in labor or if your water has broken. You have dizziness. You have mild pelvic cramps, pelvic pressure, or nagging pain in your abdominal area. You have persistent nausea, vomiting, or diarrhea. You have a bad smelling vaginal discharge. You have pain with urination. SEEK IMMEDIATE MEDICAL CARE IF:  You have a fever. You are leaking fluid from your vagina. You have spotting or bleeding from your vagina. You have severe abdominal cramping or pain. You have rapid weight loss or gain. You have shortness of breath with chest pain. You notice sudden or extreme swelling of your face, hands, ankles, feet, or legs. You have not felt your baby move in over an hour. You have severe headaches that do not go away with medicine. You have vision changes. Document Released: 01/09/2001 Document Revised: 01/20/2013 Document Reviewed: 03/18/2012 [facility] Patient Information 2015 [place], [place]. This information is not intended to replace advice given to you by your health care provider. Make sure you discuss any questions you have with your health care provider.

## 2023-04-23 ENCOUNTER — Other Ambulatory Visit: Payer: Self-pay | Admitting: Women's Health

## 2023-04-23 ENCOUNTER — Encounter: Payer: Self-pay | Admitting: Women's Health

## 2023-04-23 DIAGNOSIS — R1011 Right upper quadrant pain: Secondary | ICD-10-CM

## 2023-04-23 DIAGNOSIS — Z3A28 28 weeks gestation of pregnancy: Secondary | ICD-10-CM

## 2023-04-23 DIAGNOSIS — R110 Nausea: Secondary | ICD-10-CM

## 2023-04-23 LAB — ANTIBODY SCREEN: Antibody Screen: NEGATIVE

## 2023-04-23 LAB — CBC
Hematocrit: 34.3 % (ref 34.0–46.6)
Hemoglobin: 10.6 g/dL — ABNORMAL LOW (ref 11.1–15.9)
MCH: 24.7 pg — ABNORMAL LOW (ref 26.6–33.0)
MCHC: 30.9 g/dL — ABNORMAL LOW (ref 31.5–35.7)
MCV: 80 fL (ref 79–97)
Platelets: 325 10*3/uL (ref 150–450)
RBC: 4.3 x10E6/uL (ref 3.77–5.28)
RDW: 13.6 % (ref 11.7–15.4)
WBC: 5.8 10*3/uL (ref 3.4–10.8)

## 2023-04-23 LAB — RPR: RPR Ser Ql: NONREACTIVE

## 2023-04-23 LAB — COMPREHENSIVE METABOLIC PANEL
ALT: 44 IU/L — ABNORMAL HIGH (ref 0–32)
AST: 24 IU/L (ref 0–40)
Albumin: 3.5 g/dL — ABNORMAL LOW (ref 4.0–5.0)
Alkaline Phosphatase: 102 IU/L (ref 44–121)
BUN/Creatinine Ratio: 9 (ref 9–23)
BUN: 5 mg/dL — ABNORMAL LOW (ref 6–20)
Bilirubin Total: 0.3 mg/dL (ref 0.0–1.2)
CO2: 19 mmol/L — ABNORMAL LOW (ref 20–29)
Calcium: 8.7 mg/dL (ref 8.7–10.2)
Chloride: 104 mmol/L (ref 96–106)
Creatinine, Ser: 0.54 mg/dL — ABNORMAL LOW (ref 0.57–1.00)
Globulin, Total: 2.9 g/dL (ref 1.5–4.5)
Glucose: 85 mg/dL (ref 70–99)
Potassium: 4.5 mmol/L (ref 3.5–5.2)
Sodium: 136 mmol/L (ref 134–144)
Total Protein: 6.4 g/dL (ref 6.0–8.5)
eGFR: 129 mL/min/{1.73_m2} (ref >59)

## 2023-04-23 LAB — HIV ANTIBODY (ROUTINE TESTING W REFLEX): HIV Screen 4th Generation wRfx: NONREACTIVE

## 2023-04-23 MED ORDER — FERROUS SULFATE 325 (65 FE) MG PO TABS: 325.0000 mg | ORAL_TABLET | ORAL | 2 refills | Status: DC

## 2023-04-23 MED ORDER — PROMETHAZINE HCL 25 MG PO TABS: 12.5000 mg | ORAL_TABLET | Freq: Four times a day (QID) | ORAL | 6 refills | Status: DC | PRN

## 2023-04-23 NOTE — Addendum Note (Signed)
 Addended by: Cheral Marker on: 04/23/2023 10:58 AM   Modules accepted: Orders

## 2023-04-25 LAB — URINE CULTURE

## 2023-04-30 ENCOUNTER — Ambulatory Visit (HOSPITAL_COMMUNITY)
Admission: RE | Admit: 2023-04-30 | Discharge: 2023-04-30 | Disposition: A | Discharge status: Home / Self Care | Source: Ambulatory Visit | Attending: Women's Health | Admitting: Women's Health

## 2023-04-30 ENCOUNTER — Encounter: Payer: Self-pay | Admitting: Women's Health

## 2023-04-30 DIAGNOSIS — R110 Nausea: Secondary | ICD-10-CM | POA: Insufficient documentation

## 2023-04-30 DIAGNOSIS — K8020 Calculus of gallbladder without cholecystitis without obstruction: Secondary | ICD-10-CM | POA: Diagnosis not present

## 2023-04-30 DIAGNOSIS — R1011 Right upper quadrant pain: Secondary | ICD-10-CM | POA: Insufficient documentation

## 2023-04-30 DIAGNOSIS — Z3A28 28 weeks gestation of pregnancy: Secondary | ICD-10-CM | POA: Insufficient documentation

## 2023-05-01 ENCOUNTER — Other Ambulatory Visit: Payer: Self-pay | Admitting: Women's Health

## 2023-05-01 DIAGNOSIS — K8020 Calculus of gallbladder without cholecystitis without obstruction: Secondary | ICD-10-CM | POA: Insufficient documentation

## 2023-05-06 ENCOUNTER — Ambulatory Visit: Admitting: Obstetrics & Gynecology

## 2023-05-06 VITALS — BP 129/75 | HR 81 | Wt 391.8 lb

## 2023-05-06 DIAGNOSIS — O26613 Liver and biliary tract disorders in pregnancy, third trimester: Secondary | ICD-10-CM

## 2023-05-06 DIAGNOSIS — O10913 Unspecified pre-existing hypertension complicating pregnancy, third trimester: Secondary | ICD-10-CM

## 2023-05-06 DIAGNOSIS — Z3A30 30 weeks gestation of pregnancy: Secondary | ICD-10-CM | POA: Diagnosis not present

## 2023-05-06 DIAGNOSIS — K8020 Calculus of gallbladder without cholecystitis without obstruction: Secondary | ICD-10-CM | POA: Diagnosis not present

## 2023-05-06 DIAGNOSIS — O10919 Unspecified pre-existing hypertension complicating pregnancy, unspecified trimester: Secondary | ICD-10-CM

## 2023-05-06 DIAGNOSIS — O0993 Supervision of high risk pregnancy, unspecified, third trimester: Secondary | ICD-10-CM | POA: Diagnosis not present

## 2023-05-06 DIAGNOSIS — O0991 Supervision of high risk pregnancy, unspecified, first trimester: Secondary | ICD-10-CM

## 2023-05-06 NOTE — Progress Notes (Signed)
 HIGH-RISK PREGNANCY VISIT Patient name: Anna [name] MRN [id number]  Date of birth: Mar 23, 1995 Chief Complaint:   Routine Prenatal Visit  History of Present Illness:   Anna [name] is a 28 y.o. G47P1011 female at [redacted]w[redacted]d with an Estimated Date of Delivery: 07/12/23 being seen today for ongoing management of a high-risk pregnancy complicated by cHTN on procardia xl 30 every day + ASA 162.    Today she reports no complaints. Contractions: Not present.  .  Movement: Present. denies leaking of fluid.      01/01/2023   11:22 AM 03/24/2020    2:32 PM 03/21/2017    2:18 PM 12/04/2016    2:28 PM  Depression screen PHQ 2/9  Decreased Interest 0 0 0 0  Down, Depressed, Hopeless 0 0 1 0  PHQ - 2 Score 0 0 1 0  Altered sleeping 0  2   Tired, decreased energy 0  2   Change in appetite 0  0   Feeling bad or failure about yourself  0  0   Trouble concentrating 0  0   Moving slowly or fidgety/restless 0  1   Suicidal thoughts 0  0   PHQ-9 Score 0  6         01/01/2023   11:22 AM  GAD 7 : Generalized Anxiety Score  Nervous, Anxious, on Edge 0  Control/stop worrying 1  Worry too much - different things 1  Trouble relaxing 1  Restless 0  Easily annoyed or irritable 0  Afraid - awful might happen 1  Total GAD 7 Score 4     Review of Systems:   Pertinent items are noted in HPI Denies abnormal vaginal discharge w/ itching/odor/irritation, headaches, visual changes, shortness of breath, chest pain, abdominal pain, severe nausea/vomiting, or problems with urination or bowel movements unless otherwise stated above. Pertinent History Reviewed:  Reviewed past medical,surgical, social, obstetrical and family history.  Reviewed problem list, medications and allergies. Physical Assessment:   Vitals:   05/06/23 1152  BP: 129/75  Pulse: 81  Weight: (!) 391 lb 12.8 oz (177.7 kg)  Body mass index is 71.66 kg/m.           Physical Examination:   General appearance: alert, well  appearing, and in no distress  Mental status: alert, oriented to person, place, and time  Skin: warm & dry   Extremities:      Cardiovascular: normal heart rate noted  Respiratory: normal respiratory effort, no distress  Abdomen: gravid, soft, non-tender  Pelvic: Cervical exam deferred         Fetal Status: Fetal Heart Rate (bpm): 145 Fundal Height: 34 cm Movement: Present    Fetal Surveillance Testing today:    Chaperone: N/A    No results found for this or any previous visit (from the past 24 hours).  Assessment & Plan:  High-risk pregnancy: G3P1011 at [redacted]w[redacted]d with an Estimated Date of Delivery: 07/12/23      ICD-10-CM   1. Supervision of high risk pregnancy in first trimester  O09.91     2. Cholelithiasis affecting pregnancy in third trimester, antepartum  O26.613 Ambulatory referral to General Surgery   K80.20     3. Chronic hypertension affecting pregnancy  O10.919        Meds: No orders of the defined types were placed in this encounter.   Orders:  Orders Placed This Encounter  Procedures   Ambulatory referral to General Surgery     Labs/procedures today:  none  Treatment Plan:  keep scheduled    Follow-up: No follow-ups on file.   Future Appointments  Date Time Provider Department Center  05/17/2023  1:40 PM Tobb, Kardie, DO [facility] None  05/20/2023 10:45 AM [facility] IMG 2 [facility] None  05/20/2023 11:50 AM Cheral Marker, CNM [facility]  05/23/2023 10:10 AM [facility] NURSE [facility]  05/27/2023  2:15 PM [facility] IM[facility] 2 [facility] None  05/27/2023  3:30 PM Myna Hidalgo, DO [facility]  05/30/2023 10:10 AM [facility] NURSE [facility]  05/30/2023  2:45 PM [place], NP [facility] None  06/03/2023 10:00 AM [facility] [place] [facility] None  06/03/2023 10:50 AM Cheral Marker, CNM [facility]  06/06/2023 10:10 AM [facility] NURSE [facility]  06/10/2023 10:00 AM [facility] [place] [facility] None  06/10/2023 10:50 AM Myna Hidalgo, DO [facility]  06/13/2023 10:10 AM [facility] NURSE [facility]  06/17/2023 10:45 AM [facility] IMG 2 [facility] None  06/17/2023 11:50 AM Cheral Marker, CNM [facility]  06/20/2023 10:10 AM [facility] NURSE [facility]  06/25/2023 10:00 AM [facility] - [facility] [place] [facility] None  06/25/2023 10:50 AM Cheral Marker, CNM [facility]  06/28/2023 10:10 AM [facility] NURSE [facility]  07/01/2023 10:00 AM [facility] [place] [facility] None  07/04/2023 10:10 AM [facility] NURSE [facility]  07/08/2023 10:00 AM [facility] [place] [facility] None  07/11/2023 10:10 AM [facility] NURSE [facility]    Orders Placed This Encounter  Procedures   Ambulatory referral to General Surgery   Lazaro Arms  Attending Physician for the Center for [facility] 05/06/2023 12:11 PM

## 2023-05-13 ENCOUNTER — Other Ambulatory Visit: Payer: Self-pay | Admitting: Obstetrics & Gynecology

## 2023-05-13 DIAGNOSIS — O24419 Gestational diabetes mellitus in pregnancy, unspecified control: Secondary | ICD-10-CM

## 2023-05-13 DIAGNOSIS — O0990 Supervision of high risk pregnancy, unspecified, unspecified trimester: Secondary | ICD-10-CM

## 2023-05-13 DIAGNOSIS — Z98891 History of uterine scar from previous surgery: Secondary | ICD-10-CM

## 2023-05-13 DIAGNOSIS — I10 Essential (primary) hypertension: Secondary | ICD-10-CM

## 2023-05-16 ENCOUNTER — Encounter: Payer: Self-pay | Admitting: Women's Health

## 2023-05-17 ENCOUNTER — Encounter: Payer: Self-pay | Admitting: Cardiology

## 2023-05-17 ENCOUNTER — Ambulatory Visit (INDEPENDENT_AMBULATORY_CARE_PROVIDER_SITE_OTHER): Admitting: Cardiology

## 2023-05-17 VITALS — BP 126/84 | HR 82 | Ht 63.0 in | Wt >= 6400 oz

## 2023-05-17 DIAGNOSIS — Z3A32 32 weeks gestation of pregnancy: Secondary | ICD-10-CM | POA: Diagnosis not present

## 2023-05-17 DIAGNOSIS — O10919 Unspecified pre-existing hypertension complicating pregnancy, unspecified trimester: Secondary | ICD-10-CM

## 2023-05-17 DIAGNOSIS — O24419 Gestational diabetes mellitus in pregnancy, unspecified control: Secondary | ICD-10-CM

## 2023-05-17 MED ORDER — POTASSIUM CHLORIDE CRYS ER 20 MEQ PO TBCR: 20.0000 meq | EXTENDED_RELEASE_TABLET | Freq: Every day | ORAL | 0 refills | Status: DC

## 2023-05-17 MED ORDER — FUROSEMIDE 40 MG PO TABS: 40.0000 mg | ORAL_TABLET | Freq: Every day | ORAL | 0 refills | Status: DC

## 2023-05-17 NOTE — Progress Notes (Unsigned)
 Cardio-Obstetrics Clinic  {Choose New Eval or Follow Up [phone]}   Prior CV Studies Reviewed: The following studies were reviewed today: ***  Past Medical History:  Diagnosis Date   Allergy    GERD (gastroesophageal reflux disease)    Hypertension    Morbid obesity with BMI of 70 and over, adult (HCC) 09/29/2017   with hypertension     Past Surgical History:  Procedure Laterality Date   CESAREAN SECTION N/A 09/29/2017   Procedure: CESAREAN SECTION;  Surgeon: Albino Hum, MD;  Location: [facility] BIRTHING SUITES;  Service: Obstetrics;  Laterality: N/A;   MYRINGOTOMY     TONSILLECTOMY AND ADENOIDECTOMY     { Click here to update PMH, PSH, OB Hx then refresh note  :1}   OB History     Gravida  3   Para  1   Term  1   Preterm      AB  1   Living  1      SAB  1   IAB      Ectopic      Multiple      Live Births  1           { Click here to update OB Charting then refresh note  :1}    Current Medications: Current Meds  Medication Sig   Accu-Chek Softclix Lancets lancets Use as instructed to check blood sugar 4 times daily   aspirin  EC 81 MG tablet Take 2 tablets (162 mg total) by mouth daily. Swallow whole.   Blood Glucose Monitoring Suppl (ACCU-CHEK GUIDE ME) w/Device KIT 1 each by Does not apply route 4 (four) times daily.   ferrous sulfate  325 (65 FE) MG tablet Take 1 tablet (325 mg total) by mouth every other day.   glucose blood test strip Use as instructed to check blood sugar four times daily   NIFEdipine  (PROCARDIA -XL/NIFEDICAL-XL) 30 MG 24 hr tablet Take 1 tablet (30 mg total) by mouth daily.   nitrofurantoin , macrocrystal-monohydrate, (MACROBID ) 100 MG capsule Take 1 capsule (100 mg total) by mouth 2 (two) times daily. X 7 days   pantoprazole  (PROTONIX ) 40 MG tablet Take 1 tablet (40 mg total) by mouth daily.   Prenatal MV & Min w/FA-DHA (PRENATAL GUMMIES) 0.18-25 MG CHEW Chew by mouth.     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    Comment: [employer]  Tobacco Use   Smoking status: Never   Smokeless tobacco: Never  Vaping Use   Vaping status: Never Used  Substance and Sexual Activity   Alcohol use: No   Drug use: Yes    Types: Marijuana   Sexual activity: Yes    Birth control/protection: None  Other Topics Concern   Not on file  Social History Narrative   Not on file   Social Drivers of Health   Financial Resource Strain: Low Risk  (01/01/2023)   Overall Financial Resource Strain (CARDIA)    Difficulty of Paying Living Expenses: Not hard at all  Food Insecurity: No Food Insecurity (04/11/2023)   Hunger Vital Sign    Worried About Running Out of Food in the Last Year: Never true  Ran Out of Food in the Last Year: Never true  Transportation Needs: No Transportation Needs (01/01/2023)   PRAPARE - Administrator, Civil Service (Medical): No    Lack of Transportation (Non-Medical): No  Physical Activity: Insufficiently Active (01/01/2023)   Exercise Vital Sign    Days of Exercise per Week: 4 days    Minutes of Exercise per Session: 30 min  Stress: No Stress Concern Present (01/01/2023)   [employer] of Occupational Health - Occupational Stress Questionnaire    Feeling of Stress : Not at all  Social Connections: Moderately Isolated (01/01/2023)   Social Connection and Isolation Panel [NHANES]    Frequency of Communication with Friends and Family: More than three times a week    Frequency of Social Gatherings with Friends and Family: More than three times a week    Attends Religious Services: 1 to 4 times per year    Active Member of [employer] or Organizations: No    Attends Engineer, structural: Never     Marital Status: Never married  { Click here to update SDOH then refresh :1}    Family History  Problem Relation Age of Onset   Asthma Mother    Anemia Mother    Hypertension Father    Cancer Maternal Grandfather    Hypertension Paternal Grandmother    Heart failure Paternal Grandmother    { Click here to update FH then refresh note    :1}   ROS:   Please see the history of present illness.    *** All other systems reviewed and are negative.   Labs/EKG Reviewed:    EKG:   EKG is *** ordered today.  The ekg ordered today demonstrates ***  Recent Labs: 04/22/2023: ALT 44; BUN 5; Creatinine, Ser 0.54; Hemoglobin 10.6; Platelets 325; Potassium 4.5; Sodium 136   Recent Lipid Panel Lab Results  Component Value Date/Time   CHOL 174 12/04/2016 03:12 PM   TRIG 69 12/04/2016 03:12 PM   HDL 41 12/04/2016 03:12 PM   CHOLHDL 4.2 12/04/2016 03:12 PM   LDLCALC 119 (H) 12/04/2016 03:12 PM    Physical Exam:    VS:  BP 126/84 (BP Location: Left Arm, Patient Position: Sitting, Cuff Size: Large)   Pulse 82   Ht 5\' 3"  (1.6 m)   Wt (!) 400 lb (181.4 kg)   LMP 10/05/2022   SpO2 99%   BMI 70.86 kg/m     Wt Readings from Last 3 Encounters:  05/17/23 (!) 400 lb (181.4 kg)  05/06/23 (!) 391 lb 12.8 oz (177.7 kg)  04/22/23 (!) 389 lb (176.4 kg)     GEN: *** Well nourished, well developed in no acute distress HEENT: Normal NECK: No JVD; No carotid bruits LYMPHATICS: No lymphadenopathy CARDIAC: ***RRR, no murmurs, rubs, gallops RESPIRATORY:  Clear to auscultation without rales, wheezing or rhonchi  ABDOMEN: Soft, non-tender, non-distended MUSCULOSKELETAL:  No edema; No deformity  SKIN: Warm and dry NEUROLOGIC:  Alert and oriented x 3 PSYCHIATRIC:  Normal affect    Risk Assessment/Risk Calculators:   { Click to calculate CARPREG II - THEN refresh note :1}    { Click to caclulate Mod WHO Class of CV Risk - THEN refresh note :1}     { Click for CHADS2VASc Score - THEN  Refresh Note    :[id number]}      ASSESSMENT & PLAN:    *** There are no Patient Instructions on file for this visit.   Dispo:  No follow-ups on file.   Medication Adjustments/Labs and Tests Ordered: Current medicines are reviewed at length with the patient today.  Concerns regarding medicines are outlined above.  Tests Ordered: No orders of the defined types were placed in this encounter.  Medication Changes: No orders of the defined types were placed in this encounter.

## 2023-05-17 NOTE — Patient Instructions (Signed)
 Medication Instructions:  Your physician has recommended you make the following change in your medication:  FOR 3 DAYS: Lasix  40 mg once daily FOR 3 DAYS: Potassium 20 mEq once daily  *If you need a refill on your cardiac medications before your next appointment, please call your pharmacy*   Follow-Up: At [facility], you and your health needs are our priority.  As part of our continuing mission to provide you with exceptional heart care, our providers are all part of one team.  This team includes your primary Cardiologist (physician) and Advanced Practice Providers or APPs (Physician Assistants and Nurse Practitioners) who all work together to provide you with the care you need, when you need it.  Your next appointment:   4 week(s) [facility] or [facility]  Provider:   Kardie Tobb, DO   Other Instructions   1st Floor: - Lobby - Registration  - Pharmacy  - Lab - Cafe  2nd Floor: - PV Lab - Diagnostic Testing (echo, CT, nuclear med)  3rd Floor: - Vacant  4th Floor: - TCTS (cardiothoracic surgery) - AFib Clinic - Structural Heart Clinic - Vascular Surgery  - Vascular Ultrasound  5th Floor: - HeartCare Cardiology (general and EP) - Clinical Pharmacy for coumadin, hypertension, lipid, weight-loss medications, and med management appointments    Valet parking services will be available as well.

## 2023-05-20 ENCOUNTER — Telehealth: Payer: Self-pay

## 2023-05-20 ENCOUNTER — Encounter: Payer: Self-pay | Admitting: Women's Health

## 2023-05-20 ENCOUNTER — Ambulatory Visit: Payer: Medicaid Other | Admitting: Women's Health

## 2023-05-20 ENCOUNTER — Ambulatory Visit: Payer: Medicaid Other | Admitting: Radiology

## 2023-05-20 VITALS — BP 132/88 | HR 98 | Wt 398.0 lb

## 2023-05-20 DIAGNOSIS — O0993 Supervision of high risk pregnancy, unspecified, third trimester: Secondary | ICD-10-CM

## 2023-05-20 DIAGNOSIS — Z98891 History of uterine scar from previous surgery: Secondary | ICD-10-CM

## 2023-05-20 DIAGNOSIS — O24419 Gestational diabetes mellitus in pregnancy, unspecified control: Secondary | ICD-10-CM | POA: Diagnosis not present

## 2023-05-20 DIAGNOSIS — Z3A32 32 weeks gestation of pregnancy: Secondary | ICD-10-CM

## 2023-05-20 DIAGNOSIS — O99891 Other specified diseases and conditions complicating pregnancy: Secondary | ICD-10-CM | POA: Insufficient documentation

## 2023-05-20 DIAGNOSIS — O26613 Liver and biliary tract disorders in pregnancy, third trimester: Secondary | ICD-10-CM | POA: Diagnosis not present

## 2023-05-20 DIAGNOSIS — I10 Essential (primary) hypertension: Secondary | ICD-10-CM | POA: Diagnosis not present

## 2023-05-20 DIAGNOSIS — Z8759 Personal history of other complications of pregnancy, childbirth and the puerperium: Secondary | ICD-10-CM | POA: Insufficient documentation

## 2023-05-20 DIAGNOSIS — K8020 Calculus of gallbladder without cholecystitis without obstruction: Secondary | ICD-10-CM

## 2023-05-20 DIAGNOSIS — O409XX0 Polyhydramnios, unspecified trimester, not applicable or unspecified: Secondary | ICD-10-CM | POA: Insufficient documentation

## 2023-05-20 DIAGNOSIS — O10913 Unspecified pre-existing hypertension complicating pregnancy, third trimester: Secondary | ICD-10-CM

## 2023-05-20 DIAGNOSIS — Z6841 Body Mass Index (BMI) 40.0 and over, adult: Secondary | ICD-10-CM

## 2023-05-20 DIAGNOSIS — O0990 Supervision of high risk pregnancy, unspecified, unspecified trimester: Secondary | ICD-10-CM

## 2023-05-20 DIAGNOSIS — O24410 Gestational diabetes mellitus in pregnancy, diet controlled: Secondary | ICD-10-CM | POA: Diagnosis not present

## 2023-05-20 DIAGNOSIS — Z006 Encounter for examination for normal comparison and control in clinical research program: Secondary | ICD-10-CM

## 2023-05-20 DIAGNOSIS — O10919 Unspecified pre-existing hypertension complicating pregnancy, unspecified trimester: Secondary | ICD-10-CM

## 2023-05-20 HISTORY — DX: Personal history of other complications of pregnancy, childbirth and the puerperium: Z87.59

## 2023-05-20 LAB — POCT URINALYSIS DIPSTICK OB
Blood, UA: NEGATIVE
Glucose, UA: NEGATIVE
Ketones, UA: NEGATIVE
Leukocytes, UA: NEGATIVE
Nitrite, UA: NEGATIVE
POC,PROTEIN,UA: NEGATIVE

## 2023-05-20 NOTE — Patient Instructions (Signed)
 Anna [name], thank you for choosing our office today! We appreciate the opportunity to meet your healthcare needs. You may receive a short survey by mail, e-mail, or through [employer]. If you are happy with your care we would appreciate if you could take just a few minutes to complete the survey questions. We read all of your comments and take your feedback very seriously. Thank you again for choosing our office.  Center for [employer] Team at [facility]  [facility] at [facility] ([address] [place], [place] [address]) Entrance C, located off of [address] Free 24/7 valet parking   CLASSES: Go to [email or web address] to register for classes (childbirth, breastfeeding, waterbirth, infant CPR, daddy bootcamp, etc.)  Call the office [phone]) or go to [facility] if: You begin to have strong, frequent contractions Your water  breaks.  Sometimes it is a big gush of fluid, sometimes it is just a trickle that keeps getting your panties wet or running down your legs You have vaginal bleeding.  It is normal to have a small amount of spotting if your cervix was checked.  You don't feel your baby moving like normal.  If you don't, get you something to eat and drink and lay down and focus on feeling your baby move.   If your baby is still not moving like normal, you should call the office or go to [facility].  Call the office [phone]) or go to [facility] hospital for these signs of pre-eclampsia: Severe headache that does not go away with Tylenol  Visual changes- seeing spots, double, blurred vision Pain under your right breast or upper abdomen that does not go away with Tums or heartburn medicine Nausea and/or vomiting Severe swelling in your hands, feet, and face   Tdap Vaccine It is recommended that you get the Tdap vaccine during the third trimester of EACH pregnancy to help protect your baby from getting pertussis (whooping cough) 27-36 weeks is the BEST time to do  this so that you can pass the protection on to your baby. During pregnancy is better than after pregnancy, but if you are unable to get it during pregnancy it will be offered at the hospital.  You can get this vaccine with us , at the health department, your family doctor, or some local pharmacies Everyone who will be around your baby should also be up-to-date on their vaccines before the baby comes. Adults (who are not pregnant) only need 1 dose of Tdap during adulthood.   [facility] Pediatricians/Family Doctors [facility]): [address] Meg Spina, [phone]           [facility]: [address], [phone]                [facility]): [address], [phone] (call to ask if accepting patients) [facility] Department: [address], [place], [phone]    [facility] Pediatricians/Family Doctors [facility]): [phone], [phone] [facility]: [address] [place], [phone] [facility]: [address], [phone]  [facility] Doctors  [facility]): [phone] [facility]: [address] [phone]   [facility] Doctors [facility]: [address] [phone]  [facility] Family Doctors  [facility]: [phone], [phone]  Home Blood Pressure Monitoring for Patients   Your provider has recommended that you check your  blood pressure (BP) at least once a week at home. If you do not have a blood pressure cuff at home, one will be provided for you. Contact your provider if you have not received your monitor within 1 week.   Helpful Tips for Accurate Home Blood Pressure Checks  Don't smoke, exercise, or drink caffeine  30 minutes before checking your BP Use the restroom before checking your BP (a full bladder can raise your  pressure) Relax in a comfortable upright chair Feet on the ground Left arm resting comfortably on a flat surface at the level of your heart Legs uncrossed Back supported Sit quietly and don't talk Place the cuff on your bare arm Adjust snuggly, so that only two fingertips can fit between your skin and the top of the cuff Check 2 readings separated by at least one minute Keep a log of your BP readings For a visual, please reference this diagram: http://ccnc.care/bpdiagram  Provider Name: [facility]     Phone: [phone]  Zone 1: ALL CLEAR  Continue to monitor your symptoms:  BP reading is less than 140 (top number) or less than 90 (bottom number)  No right upper stomach pain No headaches or seeing spots No feeling nauseated or throwing up No swelling in face and hands  Zone 2: CAUTION Call your doctor's office for any of the following:  BP reading is greater than 140 (top number) or greater than 90 (bottom number)  Stomach pain under your ribs in the middle or right side Headaches or seeing spots Feeling nauseated or throwing up Swelling in face and hands  Zone 3: EMERGENCY  Seek immediate medical care if you have any of the following:  BP reading is greater than160 (top number) or greater than 110 (bottom number) Severe headaches not improving with Tylenol  Serious difficulty catching your breath Any worsening symptoms from Zone 2  Preterm Labor and Birth Information  The normal length of a pregnancy is 39-41 weeks. Preterm labor is when labor starts before 37 completed weeks of pregnancy. What are the risk factors for preterm labor? Preterm labor is more likely to occur in women who: Have certain infections during pregnancy such as a bladder infection, sexually transmitted infection, or infection inside the uterus (chorioamnionitis). Have a shorter-than-normal cervix. Have gone into preterm labor before. Have had surgery on their cervix. Are younger than age 38  or older than age 1. Are African American. Are pregnant with twins or multiple babies (multiple gestation). Take street drugs or smoke while pregnant. Do not gain enough weight while pregnant. Became pregnant shortly after having been pregnant. What are the symptoms of preterm labor? Symptoms of preterm labor include: Cramps similar to those that can happen during a menstrual period. The cramps may happen with diarrhea. Pain in the abdomen or lower back. Regular uterine contractions that may feel like tightening of the abdomen. A feeling of increased pressure in the pelvis. Increased watery or bloody mucus discharge from the vagina. Water  breaking (ruptured amniotic sac). Why is it important to recognize signs of preterm labor? It is important to recognize signs of preterm labor because babies who are born prematurely may not be fully developed. This can put them at an increased risk for: Long-term (chronic) heart and lung problems. Difficulty immediately after birth with regulating body systems, including blood sugar, body temperature, heart rate, and breathing rate. Bleeding in the brain. Cerebral palsy. Learning difficulties. Death. These risks are highest for babies who are born before 34 weeks  of pregnancy. How is preterm labor treated? Treatment depends on the length of your pregnancy, your condition, and the health of your baby. It may involve: Having a stitch (suture) placed in your cervix to prevent your cervix from opening too early (cerclage). Taking or being given medicines, such as: Hormone medicines. These may be given early in pregnancy to help support the pregnancy. Medicine to stop contractions. Medicines to help mature the baby's lungs. These may be prescribed if the risk of delivery is high. Medicines to prevent your baby from developing cerebral palsy. If the labor happens before 34 weeks of pregnancy, you may need to stay in the hospital. What should I do if I  think I am in preterm labor? If you think that you are going into preterm labor, call your health care provider right away. How can I prevent preterm labor in future pregnancies? To increase your chance of having a full-term pregnancy: Do not use any tobacco products, such as cigarettes, chewing tobacco, and e-cigarettes. If you need help quitting, ask your health care provider. Do not use street drugs or medicines that have not been prescribed to you during your pregnancy. Talk with your health care provider before taking any herbal supplements, even if you have been taking them regularly. Make sure you gain a healthy amount of weight during your pregnancy. Watch for infection. If you think that you might have an infection, get it checked right away. Make sure to tell your health care provider if you have gone into preterm labor before. This information is not intended to replace advice given to you by your health care provider. Make sure you discuss any questions you have with your health care provider. Document Revised: 05/09/2018 Document Reviewed: 06/08/2015 Elsevier Patient Education  2020 [employer].

## 2023-05-20 NOTE — Telephone Encounter (Signed)
 I called pt in reference to Impact Mom Trial. Pt did not answer. I left a detailed message with my contact information.

## 2023-05-20 NOTE — Telephone Encounter (Signed)
 Called pt in reference for Impact Mom study. Pt did not answer. I was unable to leave voicemail due to pt's voicemail box was full. I will attempt to call pt again this afternoon.

## 2023-05-20 NOTE — Progress Notes (Signed)
 US : GA = 32+3 weeks Single active female fetus, cephalic, FHR = 141 bpm, AFI = 12.5 cm, 30%, MVP = 4.9 cm,  EFW 65%, 2149g, BPP = 8/8, RI: 0.63, 0.63  52%

## 2023-05-20 NOTE — Progress Notes (Signed)
 HIGH-RISK PREGNANCY VISIT Patient name: Anna [name] MRN [id number]  Date of birth: [birth year] Chief Complaint:   Routine Prenatal Visit  History of Present Illness:   Anna [name] is a 28 y.o. G78P1011 female at [redacted]w[redacted]d with an Estimated Date of Delivery: 07/12/23 being seen today for ongoing management of a high-risk pregnancy complicated by chronic hypertension currently on nifedipine  30mg  daily, diabetes mellitus A1DM, PG BMI 71, and resolved polyhydramnios.    Today she reports  all sugars wnl- didn't bring log. Saw Dr. Emmette Harms 4/18, rx'd lasix /K+ x 3d d/t edema . Contractions: Not present. Vag. Bleeding: None.  Movement: Present. denies leaking of fluid.      01/01/2023   11:22 AM 03/24/2020    2:32 PM 03/21/2017    2:18 PM 12/04/2016    2:28 PM  Depression screen PHQ 2/9  Decreased Interest 0 0 0 0  Down, Depressed, Hopeless 0 0 1 0  PHQ - 2 Score 0 0 1 0  Altered sleeping 0  2   Tired, decreased energy 0  2   Change in appetite 0  0   Feeling bad or failure about yourself  0  0   Trouble concentrating 0  0   Moving slowly or fidgety/restless 0  1   Suicidal thoughts 0  0   PHQ-9 Score 0  6         01/01/2023   11:22 AM  GAD 7 : Generalized Anxiety Score  Nervous, Anxious, on Edge 0  Control/stop worrying 1  Worry too much - different things 1  Trouble relaxing 1  Restless 0  Easily annoyed or irritable 0  Afraid - awful might happen 1  Total GAD 7 Score 4     Review of Systems:   Pertinent items are noted in HPI Denies abnormal vaginal discharge w/ itching/odor/irritation, headaches, visual changes, shortness of breath, chest pain, abdominal pain, severe nausea/vomiting, or problems with urination or bowel movements unless otherwise stated above. Pertinent History Reviewed:  Reviewed past medical,surgical, social, obstetrical and family history.  Reviewed problem list, medications and allergies. Physical Assessment:   Vitals:   05/20/23 1148  BP:  132/88  Pulse: 98  Weight: (!) 398 lb (180.5 kg)  Body mass index is 70.5 kg/m.           Physical Examination:   General appearance: alert, well appearing, and in no distress  Mental status: alert, oriented to person, place, and time  Skin: warm & dry   Extremities: Edema: Trace    Cardiovascular: normal heart rate noted  Respiratory: normal respiratory effort, no distress  Abdomen: gravid, soft, non-tender  Pelvic: Cervical exam deferred         Fetal Status:     Movement: Present    Fetal Surveillance Testing today: US : GA = 32+3 weeks Single active female fetus, cephalic, FHR = 141 bpm, AFI = 12.5 cm, 30%, MVP = 4.9 cm,  EFW 65%, 2149g, BPP = 8/8, RI: 0.63, 0.63  52%    Chaperone: N/A  Results for orders placed or performed in visit on 05/20/23 (from the past 24 hours)  POC Urinalysis Dipstick OB   Collection Time: 05/20/23 11:53 AM  Result Value Ref Range   Color, UA     Clarity, UA     Glucose, UA Negative Negative   Bilirubin, UA     Ketones, UA neg    Spec Grav, UA     Blood, UA neg  pH, UA     POC,PROTEIN,UA Negative Negative, Trace, Small (1+), Moderate (2+), Large (3+), 4+   Urobilinogen, UA     Nitrite, UA neg    Leukocytes, UA Negative Negative   Appearance     Odor      Assessment & Plan:  High-risk pregnancy: G3P1011 at [redacted]w[redacted]d with an Estimated Date of Delivery: 07/12/23   1) CHTN w/ h/o severe pre-e and pp readmit, stable on nifedipine  30mg  daily, ASA. Continue checking bp's daily, if >140/90 or pre-e s/s, let us  know. Rx'd lasix /K+ x 3d last week by Dr. Emmette Harms for edema. Has f/u 5/7  2) A1DM, stable, reports all sugars wnl (didn't bring log), EFW today 65%/AFI wnl  3) PGBMI 71> currently 70  4) Resolved polyhydramnios> was 28cm at 24w, 13 @ 28w, today 12.5cm  5) Cholelithiasis> referred to [facility], missed appt, plans to call to reschedule. No pain since following GB diet plan  6) Prev c/s> wants RCS  7) Recent ASB> s/p macrobid , urine cx poc  today  Meds: No orders of the defined types were placed in this encounter.   Labs/procedures today: U/S  Treatment Plan:  EFW q 4w   2x/wk testing nst/sono      Deliver 37-39.0wks (or as per MFM w/ poor control)____   Reviewed: Preterm labor symptoms and general obstetric precautions including but not limited to vaginal bleeding, contractions, leaking of fluid and fetal movement were reviewed in detail with the patient.  All questions were answered. Does have home bp cuff. Office bp cuff given: not applicable. Check bp daily, let us  know if consistently >140 and/or >90.  Follow-up: Return for As scheduled; change appts to MD only for now please.   Future Appointments  Date Time Provider Department Center  05/23/2023 10:10 AM [facility] NURSE [facility]  05/27/2023  2:15 PM [facility] IMG 2 [facility] None  05/27/2023  3:30 PM Keene Pastures, DO [facility]  05/30/2023 10:10 AM [facility] NURSE [facility]  05/30/2023  2:45 PM [facility], NP [facility] None  06/03/2023 10:00 AM [facility] US  [facility] None  06/03/2023 10:50 AM Ferd Householder, CNM [facility]  06/05/2023 11:00 AM Tobb, Kardie, DO [facility] None  06/06/2023 10:10 AM [facility] NURSE [facility]  06/10/2023 10:00 AM [facility] US  [facility] None  06/10/2023 10:50 AM Keene Pastures, DO [facility]  06/13/2023 10:10 AM [facility] NURSE [facility]  06/17/2023 10:45 AM [facility] IMG 2 [facility] None  06/17/2023 11:50 AM Wendelyn Halter, MD [facility]  06/20/2023 10:10 AM [facility] NURSE [facility]  06/25/2023 10:00 AM [facility] US  [facility] None  06/25/2023 10:50 AM Wendelyn Halter, MD [facility]  06/28/2023 10:10 AM [facility] NURSE [facility]  07/01/2023 10:00 AM [facility] - [facility] US  [facility] None  07/01/2023 10:50 AM Keene Pastures, DO [facility]  07/04/2023 10:10 AM [facility] NURSE [facility]  07/08/2023 10:00 AM [facility] US  [facility] None  07/11/2023 10:10 AM  [facility] NURSE [facility]    Orders Placed This Encounter  Procedures   Urine Culture   POC Urinalysis Dipstick OB   Ferd Householder CNM, [facility] 05/20/2023 12:38 PM

## 2023-05-21 ENCOUNTER — Other Ambulatory Visit: Payer: Self-pay | Admitting: Obstetrics & Gynecology

## 2023-05-21 ENCOUNTER — Telehealth: Payer: Self-pay | Admitting: *Deleted

## 2023-05-21 DIAGNOSIS — I10 Essential (primary) hypertension: Secondary | ICD-10-CM

## 2023-05-21 DIAGNOSIS — O24419 Gestational diabetes mellitus in pregnancy, unspecified control: Secondary | ICD-10-CM

## 2023-05-21 DIAGNOSIS — Z98891 History of uterine scar from previous surgery: Secondary | ICD-10-CM

## 2023-05-21 DIAGNOSIS — O0990 Supervision of high risk pregnancy, unspecified, unspecified trimester: Secondary | ICD-10-CM

## 2023-05-21 DIAGNOSIS — O09299 Supervision of pregnancy with other poor reproductive or obstetric history, unspecified trimester: Secondary | ICD-10-CM

## 2023-05-21 NOTE — Telephone Encounter (Signed)
 Called to follow up on the Impact Mom trial discussed with her by Dr. Emmette Harms. No answer and voicemail was full/unable to leave a message.

## 2023-05-22 ENCOUNTER — Encounter: Payer: Self-pay | Admitting: Women's Health

## 2023-05-22 ENCOUNTER — Telehealth: Payer: Self-pay

## 2023-05-22 DIAGNOSIS — Z006 Encounter for examination for normal comparison and control in clinical research program: Secondary | ICD-10-CM

## 2023-05-22 LAB — URINE CULTURE

## 2023-05-22 NOTE — Telephone Encounter (Signed)
 Called to speak with pt reference to Impact Mom study. Pt did not answer. Pt's voicemail was full. Will attempt to call back later this afternoon.

## 2023-05-23 ENCOUNTER — Other Ambulatory Visit: Payer: Self-pay | Admitting: Obstetrics & Gynecology

## 2023-05-23 ENCOUNTER — Telehealth: Payer: Self-pay

## 2023-05-23 ENCOUNTER — Other Ambulatory Visit: Payer: Medicaid Other

## 2023-05-23 DIAGNOSIS — Z98891 History of uterine scar from previous surgery: Secondary | ICD-10-CM

## 2023-05-23 DIAGNOSIS — Z006 Encounter for examination for normal comparison and control in clinical research program: Secondary | ICD-10-CM

## 2023-05-23 DIAGNOSIS — O24419 Gestational diabetes mellitus in pregnancy, unspecified control: Secondary | ICD-10-CM

## 2023-05-23 DIAGNOSIS — I10 Essential (primary) hypertension: Secondary | ICD-10-CM

## 2023-05-23 DIAGNOSIS — O0990 Supervision of high risk pregnancy, unspecified, unspecified trimester: Secondary | ICD-10-CM

## 2023-05-23 NOTE — Telephone Encounter (Signed)
 Called to follow up on Impact of [employer] in Improving Maternal Cardiovascular Health in Postpartum. She had no additional questions and agreed to participate. Will have [employer] follow-up per study.

## 2023-05-27 ENCOUNTER — Ambulatory Visit: Payer: Medicaid Other | Admitting: Radiology

## 2023-05-27 ENCOUNTER — Encounter: Payer: Self-pay | Admitting: Obstetrics & Gynecology

## 2023-05-27 ENCOUNTER — Ambulatory Visit: Payer: Medicaid Other | Admitting: Obstetrics & Gynecology

## 2023-05-27 ENCOUNTER — Encounter: Payer: Medicaid Other | Admitting: Women's Health

## 2023-05-27 VITALS — BP 148/101 | HR 76 | Wt >= 6400 oz

## 2023-05-27 DIAGNOSIS — O09299 Supervision of pregnancy with other poor reproductive or obstetric history, unspecified trimester: Secondary | ICD-10-CM

## 2023-05-27 DIAGNOSIS — Z98891 History of uterine scar from previous surgery: Secondary | ICD-10-CM

## 2023-05-27 DIAGNOSIS — O09293 Supervision of pregnancy with other poor reproductive or obstetric history, third trimester: Secondary | ICD-10-CM

## 2023-05-27 DIAGNOSIS — Z3A33 33 weeks gestation of pregnancy: Secondary | ICD-10-CM

## 2023-05-27 DIAGNOSIS — O0993 Supervision of high risk pregnancy, unspecified, third trimester: Secondary | ICD-10-CM

## 2023-05-27 DIAGNOSIS — O10919 Unspecified pre-existing hypertension complicating pregnancy, unspecified trimester: Secondary | ICD-10-CM

## 2023-05-27 DIAGNOSIS — O24419 Gestational diabetes mellitus in pregnancy, unspecified control: Secondary | ICD-10-CM

## 2023-05-27 DIAGNOSIS — I10 Essential (primary) hypertension: Secondary | ICD-10-CM

## 2023-05-27 DIAGNOSIS — O99213 Obesity complicating pregnancy, third trimester: Secondary | ICD-10-CM | POA: Diagnosis not present

## 2023-05-27 DIAGNOSIS — O10913 Unspecified pre-existing hypertension complicating pregnancy, third trimester: Secondary | ICD-10-CM | POA: Diagnosis not present

## 2023-05-27 DIAGNOSIS — O0990 Supervision of high risk pregnancy, unspecified, unspecified trimester: Secondary | ICD-10-CM

## 2023-05-27 DIAGNOSIS — O24410 Gestational diabetes mellitus in pregnancy, diet controlled: Secondary | ICD-10-CM

## 2023-05-27 LAB — POCT URINALYSIS DIPSTICK OB
Blood, UA: NEGATIVE
Glucose, UA: NEGATIVE
Nitrite, UA: NEGATIVE

## 2023-05-27 NOTE — Progress Notes (Signed)
 New Patient Office Visit  Subjective   Patient ID: Anna [name], female    DOB: 09/26/1995  Age: 28 y.o. MRN: [id number]  CC:  Chief Complaint  Patient presents with   Establish Care    HPI Lezley S. Duffee is a 28 year old female who presents on May 30, 2023, to establish care, with a primary concern of anemia. She is currently [redacted] weeks pregnant and classified as a high-risk pregnancy. Her medical history is significant for preeclampsia, gestational diabetes, and pre-pregnancy prediabetes. She is currently taking Procardia  (nifedipine ) for hypertension management.  The patient is gravida 3, para 1, with one prior miscarriage. She is being followed by a nutritionist and is actively working on carbohydrate counting to manage her blood glucose levels. Her BMI is 72.27. She has a history of anemia and is currently taking an iron supplement. She denies chest pain, shortness of breath, or any other concerning symptoms. The plan includes checking an anemia panel and hemoglobin A1c to guide further management. US  06/03/2023     05/30/2023    2:54 PM 01/01/2023   11:22 AM 03/24/2020    2:32 PM  PHQ9 SCORE ONLY  PHQ-9 Total Score 0 0 0       05/30/2023    2:54 PM 01/01/2023   11:22 AM  GAD 7 : Generalized Anxiety Score  Nervous, Anxious, on Edge 0 0  Control/stop worrying 0 1  Worry too much - different things 0 1  Trouble relaxing 0 1  Restless 0 0  Easily annoyed or irritable 0 0  Afraid - awful might happen 0 1  Total GAD 7 Score 0 4  Anxiety Difficulty Not difficult at all      Outpatient Encounter Medications as of 05/30/2023  Medication Sig   Accu-Chek Softclix Lancets lancets Use as instructed to check blood sugar 4 times daily   aspirin  EC 81 MG tablet Take 2 tablets (162 mg total) by mouth daily. Swallow whole.   Blood Glucose Monitoring Suppl (ACCU-CHEK GUIDE ME) w/Device KIT 1 each by Does not apply route 4 (four) times daily.   ferrous sulfate  325 (65 FE) MG tablet  Take 1 tablet (325 mg total) by mouth every other day.   glucose blood test strip Use as instructed to check blood sugar four times daily   NIFEdipine  (PROCARDIA -XL/NIFEDICAL-XL) 30 MG 24 hr tablet Take 1 tablet (30 mg total) by mouth daily.   pantoprazole  (PROTONIX ) 40 MG tablet Take 1 tablet (40 mg total) by mouth daily.   Prenatal MV & Min w/FA-DHA (PRENATAL GUMMIES) 0.18-25 MG CHEW Chew by mouth.   [DISCONTINUED] fluticasone  (FLONASE ) 50 MCG/ACT nasal spray Place 2 sprays into both nostrils daily. (Patient not taking: Reported on 01/26/2019)   [DISCONTINUED] promethazine  (PHENERGAN ) 25 MG tablet Take 0.5-1 tablets (12.5-25 mg total) by mouth every 6 (six) hours as needed. (Patient not taking: Reported on 05/17/2023)   No facility-administered encounter medications on file as of 05/30/2023.    Past Medical History:  Diagnosis Date   Allergy    GERD (gastroesophageal reflux disease)    Hypertension    Morbid obesity with BMI of 70 and over, adult (HCC) 09/29/2017   with hypertension     Past Surgical History:  Procedure Laterality Date   CESAREAN SECTION N/A 09/29/2017   Procedure: CESAREAN SECTION;  Surgeon: Albino Hum, MD;  [facility]: [facility];  Service: Obstetrics;  Laterality: N/A;   MYRINGOTOMY     TONSILLECTOMY AND ADENOIDECTOMY  Family History  Problem Relation Age of Onset   Asthma Mother    Anemia Mother    Hypertension Father    Cancer Maternal Grandfather    Hypertension Paternal Grandmother    Heart failure Paternal Grandmother     Social History   Socioeconomic History   Marital status: Single    Spouse name: Not on file   Number of children: Not on file   Years of education: Not on file   Highest education level: Not on file  Occupational History   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    Comment: [employer]  Tobacco Use   Smoking status: Never   Smokeless tobacco: Never  Vaping Use   Vaping status: Never Used  Substance and  Sexual Activity   Alcohol use: No   Drug use: Yes    Types: Marijuana   Sexual activity: Yes    Birth control/protection: None  Other Topics Concern   Not on file  Social History Narrative   Not on file   Social Drivers of Health   Financial Resource Strain: Low Risk  (01/01/2023)   Overall Financial Resource Strain (CARDIA)    Difficulty of Paying Living Expenses: Not hard at all  Food Insecurity: No Food Insecurity (04/11/2023)   Hunger Vital Sign    Worried About Running Out of Food in the Last Year: Never true    Ran Out of Food in the Last Year: Never true  Transportation Needs: No Transportation Needs (01/01/2023)   PRAPARE - Administrator, Civil Service (Medical): No    Lack of Transportation (Non-Medical): No  Physical Activity: Insufficiently Active (01/01/2023)   Exercise Vital Sign    Days of Exercise per Week: 4 days    Minutes of Exercise per Session: 30 min  Stress: No Stress Concern Present (01/01/2023)   [employer] of Occupational Health - Occupational Stress Questionnaire    Feeling of Stress : Not at all  Social Connections: Moderately Isolated (01/01/2023)   Social Connection and Isolation Panel [NHANES]    Frequency of Communication with Friends and Family: More than three times a week    Frequency of Social Gatherings with Friends and Family: More than three times a week    Attends Religious Services: 1 to 4 times per year    Active Member of [employer] or Organizations: No    Attends Banker Meetings: Never    Marital Status: Never married  Intimate Partner Violence: Not At Risk (01/01/2023)   Humiliation, Afraid, Rape, and Kick questionnaire    Fear of Current or Ex-Partner: No    Emotionally Abused: No    Physically Abused: No    Sexually Abused: No    Review of Systems  Constitutional:  Negative for chills and fever.  HENT:  Negative for ear pain and sore throat.   Respiratory:  Negative for cough and shortness of breath.    Cardiovascular:  Negative for chest pain and leg swelling.  Gastrointestinal:  Negative for constipation, diarrhea, nausea and vomiting.  Skin:  Negative for itching and rash.  Neurological:  Negative for dizziness and headaches.  Psychiatric/Behavioral:  Negative for depression, substance abuse and suicidal ideas. The patient is not nervous/anxious and does not have insomnia.    Negative unless indicated in HPI    Objective   BP 129/84   Pulse 77   Temp 97.8 F (36.6 C) (Temporal)   Ht 5\' 3"  (1.6 m)   Wt (!) 408  lb (185.1 kg)   LMP 10/05/2022   SpO2 97%   BMI 72.27 kg/m   Physical Exam Vitals and nursing note reviewed.  Constitutional:      Appearance: She is morbidly obese.  HENT:     Head: Normocephalic and atraumatic.     Right Ear: Tympanic membrane, ear canal and external ear normal. There is no impacted cerumen.     Left Ear: Tympanic membrane, ear canal and external ear normal. There is no impacted cerumen.     Nose: Nose normal.     Mouth/Throat:     Mouth: Mucous membranes are moist.  Eyes:     General: No scleral icterus.    Extraocular Movements: Extraocular movements intact.     Conjunctiva/sclera: Conjunctivae normal.     Pupils: Pupils are equal, round, and reactive to light.  Neck:     Vascular: No carotid bruit.  Cardiovascular:     Heart sounds: Normal heart sounds.  Pulmonary:     Effort: Pulmonary effort is normal.     Breath sounds: Normal breath sounds.  Abdominal:     General: Bowel sounds are normal.     Palpations: Abdomen is soft.  Musculoskeletal:        General: Normal range of motion.     Cervical back: Normal range of motion and neck supple. No rigidity or tenderness.     Right lower leg: No edema.     Left lower leg: No edema.  Lymphadenopathy:     Cervical: No cervical adenopathy.  Skin:    General: Skin is warm and dry.     Findings: No rash.  Neurological:     Mental Status: She is alert and oriented to person, place, and  time.     Gait: Gait is intact.  Psychiatric:        Mood and Affect: Mood normal.        Behavior: Behavior normal.        Thought Content: Thought content normal.        Judgment: Judgment normal.     Last CBC Lab Results  Component Value Date   WBC 5.8 04/22/2023   HGB 10.6 (L) 04/22/2023   HCT 34.3 04/22/2023   MCV 80 04/22/2023   MCH 24.7 (L) 04/22/2023   RDW 13.6 04/22/2023   PLT 325 04/22/2023   Last metabolic panel Lab Results  Component Value Date   GLUCOSE 85 04/22/2023   NA 136 04/22/2023   K 4.5 04/22/2023   CL 104 04/22/2023   CO2 19 (L) 04/22/2023   BUN 5 (L) 04/22/2023   CREATININE 0.54 (L) 04/22/2023   EGFR 129 04/22/2023   CALCIUM 8.7 04/22/2023   PROT 6.4 04/22/2023   ALBUMIN 3.5 (L) 04/22/2023   LABGLOB 2.9 04/22/2023   AGRATIO 1.1 (L) 09/23/2017   BILITOT 0.3 04/22/2023   ALKPHOS 102 04/22/2023   AST 24 04/22/2023   ALT 44 (H) 04/22/2023   ANIONGAP 12 10/05/2017   Last lipids Lab Results  Component Value Date   CHOL 174 12/04/2016   HDL 41 12/04/2016   LDLCALC 119 (H) 12/04/2016   TRIG 69 12/04/2016   CHOLHDL 4.2 12/04/2016   Last hemoglobin A1c Lab Results  Component Value Date   HGBA1C 5.8 (H) 01/01/2023   Last thyroid  functions Lab Results  Component Value Date   TSH 2.520 12/04/2016   Last vitamin D No results found for: "25OHVITD2", "25OHVITD3", "VD25OH" Last vitamin B12 and Folate No results found for: "  [id number]", "FOLATE"      Assessment & Plan:  Gestational diabetes mellitus (GDM), antepartum, gestational diabetes method of control unspecified -     Bayer DCA Hb A1c Waived  Iron deficiency anemia secondary to inadequate dietary iron intake  Pregnant and not yet delivered in third trimester  Obesity, morbid, BMI 50 or higher (HCC) -     Lipid panel -     Thyroid  Profile  Chronic hypertension affecting pregnancy -     CMP14+EGFR  Alpha thalassemia silent carrier -     Anemia Profile B  Supervision of  high risk pregnancy in first trimester  Encounter for general adult medical examination with abnormal findings -     Lipid panel -     Thyroid  Profile -     CMP14+EGFR -     Anemia Profile B -     Bayer DCA Hb A1c Waived   Shayli is 28 yrs old [place] female seen today to establish Gestational Diabetes: A1c Anemia: anemia panel: Continue ferrous sulfate  Morbid obese: continue Carbs Counts and follow with Nutritionist   HTN: Continue Procardia , BMP Labs: anemia panel, TSH, lipid, CMP  Continue healthy lifestyle choices, including diet (rich in fruits, vegetables, and lean proteins, and low in salt and simple carbohydrates) and exercise (at least 30 minutes of moderate physical activity daily).     The above assessment and management plan was discussed with the patient. The patient verbalized understanding of and has agreed to the management plan. Patient is aware to call the clinic if they develop any new symptoms or if symptoms persist or worsen. Patient is aware when to return to the clinic for a follow-up visit. Patient educated on when it is appropriate to go to the emergency department.  Return in about 3 months (around 08/30/2023).   Satara Virella [place], DNP [facility] [address] [place], [place] [address] [phone]  Note: This document was prepared by [employer] voice dictation technology and any errors that results from this process are unintentional.

## 2023-05-27 NOTE — Progress Notes (Signed)
 US : GA = 33+3 weeks Single active female fetus, cephalic, FHR = 132 bpm, AFI = 15.5 cm, 57%, MVP = 6 cm BPP = 8/8,  RI: 0.61, 0.67  61%  good EDF

## 2023-05-27 NOTE — Progress Notes (Signed)
 HIGH-RISK PREGNANCY VISIT Patient name: Anna [name] MRN [id number]  Date of birth: Dec 22, 1995 Chief Complaint:   No chief complaint on file.  History of Present Illness:   Anna [name] is a 28 y.o. G68P1011 female at [redacted]w[redacted]d with an Estimated Date of Delivery: 07/12/23 being seen today for ongoing management of a high-risk pregnancy complicated by:  -Chronic HTN -Obesity -prior C-section -GDMA1 -Cholelithiasis  Today she reports  that she is just having a "off day"  .  Notes feeling irritable and mild headache.  Denies change in her vision or right upper quadrant pain.  Patient did not bring sugar log with her today  Contractions: Not present. Vag. Bleeding: None.  Movement: Present. denies leaking of fluid.      01/01/2023   11:22 AM 03/24/2020    2:32 PM 03/21/2017    2:18 PM 12/04/2016    2:28 PM  Depression screen PHQ 2/9  Decreased Interest 0 0 0 0  Down, Depressed, Hopeless 0 0 1 0  PHQ - 2 Score 0 0 1 0  Altered sleeping 0  2   Tired, decreased energy 0  2   Change in appetite 0  0   Feeling bad or failure about yourself  0  0   Trouble concentrating 0  0   Moving slowly or fidgety/restless 0  1   Suicidal thoughts 0  0   PHQ-9 Score 0  6      Current Outpatient Medications  Medication Instructions   Accu-Chek Softclix Lancets lancets Use as instructed to check blood sugar 4 times daily   aspirin  EC 162 mg, Oral, Daily, Swallow whole.   Blood Glucose Monitoring Suppl (ACCU-CHEK GUIDE ME) w/Device KIT 1 each, Does not apply, 4 times daily   ferrous sulfate  325 mg, Oral, Every other day   glucose blood test strip Use as instructed to check blood sugar four times daily   NIFEdipine  (PROCARDIA -XL/NIFEDICAL-XL) 30 mg, Oral, Daily   pantoprazole  (PROTONIX ) 40 mg, Oral, Daily   Prenatal MV & Min w/FA-DHA (PRENATAL GUMMIES) 0.18-25 MG CHEW Chew by mouth.   promethazine  (PHENERGAN ) 12.5-25 mg, Oral, Every 6 hours PRN     Review of Systems:   Pertinent items  are noted in HPI Denies abnormal vaginal discharge w/ itching/odor/irritation, headaches, visual changes, shortness of breath, chest pain, abdominal pain, severe nausea/vomiting, or problems with urination or bowel movements unless otherwise stated above. Pertinent History Reviewed:  Reviewed past medical,surgical, social, obstetrical and family history.  Reviewed problem list, medications and allergies. Physical Assessment:   Vitals:   05/27/23 1454 05/27/23 1525  BP: (!) 159/86 (!) 148/101  Pulse: 91 76  Weight: (!) 406 lb (184.2 kg)   Body mass index is 71.92 kg/m.           Physical Examination:   General appearance: alert, well appearing, and in no distress  Mental status: normal mood, behavior, speech, dress, motor activity, and thought processes  Skin: warm & dry   Extremities:      Cardiovascular: normal heart rate noted  Respiratory: normal respiratory effort, no distress  Abdomen: gravid, soft, non-tender  Pelvic: Cervical exam deferred         Fetal Status:     Movement: Present    Fetal Surveillance Testing today: 33+3 weeks Single active female fetus, cephalic, FHR = 132 bpm, AFI = 15.5 cm, 57%, MVP = 6 cm BPP = 8/8,  RI: 0.61, 0.67  61%  good EDF  Chaperone: N/A    Results for orders placed or performed in visit on 05/27/23 (from the past 24 hours)  POC Urinalysis Dipstick OB   Collection Time: 05/27/23  3:10 PM  Result Value Ref Range   Color, UA     Clarity, UA     Glucose, UA Negative Negative   Bilirubin, UA     Ketones, UA small    Spec Grav, UA     Blood, UA neg    pH, UA     POC,PROTEIN,UA Trace Negative, Trace, Small (1+), Moderate (2+), Large (3+), 4+   Urobilinogen, UA     Nitrite, UA neg    Leukocytes, UA Moderate (2+) (A) Negative   Appearance     Odor       Assessment & Plan:  High-risk pregnancy: G3P1011 at [redacted]w[redacted]d with an Estimated Date of Delivery: 07/12/23   1. Prior C_section []  as of currently plan for repeat ~ 37-38wks  - POC  Urinalysis Dipstick OB  2. Gestational diabetes mellitus, class A1 -strongly encourage pt to bring log every visit - POC Urinalysis Dipstick OB  3. Supervision of high risk pregnancy in third trimester (Primary) - POC Urinalysis Dipstick OB  4. Chronic hypertension affecting pregnancy -per pt home BP better than when here -normal BPP and dopplers today, continue antepartum testing - Continue growth every 4 weeks []  repeat BP check this Thursday, if still elevated will plan to increase Procardia  AND repeat lab work   Meds: No orders of the defined types were placed in this encounter.   Labs/procedures today: BPP/dopplers  Treatment Plan:  as outlined above  Reviewed: Preterm labor symptoms and general obstetric precautions including but not limited to vaginal bleeding, contractions, leaking of fluid and fetal movement were reviewed in detail with the patient.  All questions were answered. Pt has home bp cuff. Check bp weekly, let us  know if >140/90.   Follow-up: Return for as scheduled twice weekly.   Future Appointments  Date Time Provider Department Center  05/30/2023 10:10 AM [facility] NURSE [facility]  05/30/2023  2:45 PM [facility], Adell Hones, NP [facility] None  06/03/2023 10:00 AM [facility] US  [facility] None  06/03/2023 10:50 AM Ferd Householder, CNM [facility]  06/05/2023 11:00 AM Tobb, Kardie, DO [facility]  06/06/2023 10:10 AM [facility] NURSE [facility]  06/10/2023 10:00 AM [facility] US  [facility] None  06/10/2023 10:50 AM Keene Pastures, DO [facility]  06/13/2023 10:10 AM [facility] NURSE [facility]  06/17/2023 10:45 AM [facility] IMG 2 [facility] None  06/17/2023 11:50 AM Wendelyn Halter, MD [facility]  06/20/2023 10:10 AM [facility] NURSE [facility]  06/25/2023 10:00 AM [facility] US  [facility] None  06/25/2023 10:50 AM Wendelyn Halter, MD [facility]  06/28/2023 10:10 AM [facility] NURSE [facility]  07/01/2023 10:00 AM  [facility] US  [facility] None  07/01/2023 10:50 AM Keene Pastures, DO [facility]  07/04/2023 10:10 AM [facility] NURSE [facility]  07/08/2023 10:00 AM [facility] US  [facility] None  07/11/2023 10:10 AM [facility] NURSE [facility]    Orders Placed This Encounter  Procedures   POC Urinalysis Dipstick OB    Keene Pastures, DO Attending Obstetrician & Gynecologist, [facility] [employer], [facility]

## 2023-05-30 ENCOUNTER — Encounter: Payer: Self-pay | Admitting: Nurse Practitioner

## 2023-05-30 ENCOUNTER — Ambulatory Visit: Admitting: Nurse Practitioner

## 2023-05-30 ENCOUNTER — Other Ambulatory Visit: Payer: Medicaid Other

## 2023-05-30 VITALS — BP 129/84 | HR 77 | Temp 97.8°F | Ht 63.0 in | Wt >= 6400 oz

## 2023-05-30 DIAGNOSIS — O10919 Unspecified pre-existing hypertension complicating pregnancy, unspecified trimester: Secondary | ICD-10-CM | POA: Diagnosis not present

## 2023-05-30 DIAGNOSIS — D563 Thalassemia minor: Secondary | ICD-10-CM | POA: Diagnosis not present

## 2023-05-30 DIAGNOSIS — O0991 Supervision of high risk pregnancy, unspecified, first trimester: Secondary | ICD-10-CM

## 2023-05-30 DIAGNOSIS — O24419 Gestational diabetes mellitus in pregnancy, unspecified control: Secondary | ICD-10-CM | POA: Diagnosis not present

## 2023-05-30 DIAGNOSIS — Z0001 Encounter for general adult medical examination with abnormal findings: Secondary | ICD-10-CM | POA: Diagnosis not present

## 2023-05-30 DIAGNOSIS — D508 Other iron deficiency anemias: Secondary | ICD-10-CM | POA: Diagnosis not present

## 2023-05-30 DIAGNOSIS — Z3493 Encounter for supervision of normal pregnancy, unspecified, third trimester: Secondary | ICD-10-CM | POA: Insufficient documentation

## 2023-05-30 LAB — BAYER DCA HB A1C WAIVED: HB A1C (BAYER DCA - WAIVED): 5.6 % (ref 4.8–5.6)

## 2023-05-31 LAB — CMP14+EGFR
ALT: 48 IU/L — ABNORMAL HIGH (ref 0–32)
AST: 21 IU/L (ref 0–40)
Albumin: 3.3 g/dL — ABNORMAL LOW (ref 4.0–5.0)
Alkaline Phosphatase: 107 IU/L (ref 44–121)
BUN/Creatinine Ratio: 9 (ref 9–23)
BUN: 5 mg/dL — ABNORMAL LOW (ref 6–20)
Bilirubin Total: 0.3 mg/dL (ref 0.0–1.2)
CO2: 19 mmol/L — ABNORMAL LOW (ref 20–29)
Calcium: 8.4 mg/dL — ABNORMAL LOW (ref 8.7–10.2)
Chloride: 104 mmol/L (ref 96–106)
Creatinine, Ser: 0.55 mg/dL — ABNORMAL LOW (ref 0.57–1.00)
Globulin, Total: 2.8 g/dL (ref 1.5–4.5)
Glucose: 79 mg/dL (ref 70–99)
Potassium: 3.9 mmol/L (ref 3.5–5.2)
Sodium: 139 mmol/L (ref 134–144)
Total Protein: 6.1 g/dL (ref 6.0–8.5)
eGFR: 129 mL/min/{1.73_m2} (ref >59)

## 2023-05-31 LAB — ANEMIA PROFILE B
Ferritin: 61 ng/mL (ref 15–150)
Folate: 8.1 ng/mL (ref >3.0)
Iron Saturation: 11 % — ABNORMAL LOW (ref 15–55)
Iron: 46 ug/dL (ref 27–159)
Total Iron Binding Capacity: 435 ug/dL (ref 250–450)
UIBC: 389 ug/dL (ref 131–425)
Vitamin B-12: 201 pg/mL — ABNORMAL LOW (ref 232–1245)

## 2023-05-31 LAB — THYROID PANEL
Free Thyroxine Index: 1.9 (ref 1.2–4.9)
T3 Uptake Ratio: 12 % — ABNORMAL LOW (ref 24–39)
T4, Total: 16 ug/dL — ABNORMAL HIGH (ref 4.5–12.0)

## 2023-05-31 LAB — LIPID PANEL
Chol/HDL Ratio: 4.1 ratio (ref 0.0–4.4)
Cholesterol, Total: 211 mg/dL — ABNORMAL HIGH (ref 100–199)
HDL: 51 mg/dL (ref >39)
LDL Chol Calc (NIH): 135 mg/dL — ABNORMAL HIGH (ref 0–99)
Triglycerides: 139 mg/dL (ref 0–149)
VLDL Cholesterol Cal: 25 mg/dL (ref 5–40)

## 2023-06-03 ENCOUNTER — Ambulatory Visit: Payer: Medicaid Other

## 2023-06-03 ENCOUNTER — Encounter: Payer: Self-pay | Admitting: Women's Health

## 2023-06-03 ENCOUNTER — Ambulatory Visit: Admitting: Women's Health

## 2023-06-03 VITALS — BP 158/106 | HR 64 | Wt >= 6400 oz

## 2023-06-03 DIAGNOSIS — O99210 Obesity complicating pregnancy, unspecified trimester: Secondary | ICD-10-CM

## 2023-06-03 DIAGNOSIS — O99891 Other specified diseases and conditions complicating pregnancy: Secondary | ICD-10-CM | POA: Diagnosis not present

## 2023-06-03 DIAGNOSIS — O24419 Gestational diabetes mellitus in pregnancy, unspecified control: Secondary | ICD-10-CM | POA: Diagnosis not present

## 2023-06-03 DIAGNOSIS — O24410 Gestational diabetes mellitus in pregnancy, diet controlled: Secondary | ICD-10-CM

## 2023-06-03 DIAGNOSIS — Z98891 History of uterine scar from previous surgery: Secondary | ICD-10-CM

## 2023-06-03 DIAGNOSIS — I10 Essential (primary) hypertension: Secondary | ICD-10-CM | POA: Diagnosis not present

## 2023-06-03 DIAGNOSIS — O99213 Obesity complicating pregnancy, third trimester: Secondary | ICD-10-CM | POA: Diagnosis not present

## 2023-06-03 DIAGNOSIS — K8020 Calculus of gallbladder without cholecystitis without obstruction: Secondary | ICD-10-CM

## 2023-06-03 DIAGNOSIS — O10913 Unspecified pre-existing hypertension complicating pregnancy, third trimester: Secondary | ICD-10-CM

## 2023-06-03 DIAGNOSIS — O26613 Liver and biliary tract disorders in pregnancy, third trimester: Secondary | ICD-10-CM

## 2023-06-03 DIAGNOSIS — Z3A34 34 weeks gestation of pregnancy: Secondary | ICD-10-CM

## 2023-06-03 DIAGNOSIS — Z6841 Body Mass Index (BMI) 40.0 and over, adult: Secondary | ICD-10-CM

## 2023-06-03 DIAGNOSIS — R8271 Bacteriuria: Secondary | ICD-10-CM

## 2023-06-03 DIAGNOSIS — O10919 Unspecified pre-existing hypertension complicating pregnancy, unspecified trimester: Secondary | ICD-10-CM | POA: Diagnosis not present

## 2023-06-03 DIAGNOSIS — O0990 Supervision of high risk pregnancy, unspecified, unspecified trimester: Secondary | ICD-10-CM

## 2023-06-03 DIAGNOSIS — O0993 Supervision of high risk pregnancy, unspecified, third trimester: Secondary | ICD-10-CM

## 2023-06-03 LAB — POCT URINALYSIS DIPSTICK OB
Blood, UA: NEGATIVE
Glucose, UA: NEGATIVE
Ketones, UA: NEGATIVE
Nitrite, UA: POSITIVE

## 2023-06-03 MED ORDER — NITROFURANTOIN MONOHYD MACRO 100 MG PO CAPS
100.0000 mg | ORAL_CAPSULE | Freq: Two times a day (BID) | ORAL | 0 refills | Status: DC
Start: 2023-06-03 — End: 2023-06-20

## 2023-06-03 MED ORDER — NIFEDIPINE ER 60 MG PO TB24: 60.0000 mg | ORAL_TABLET | Freq: Every day | ORAL | 2 refills | Status: DC

## 2023-06-03 NOTE — Progress Notes (Signed)
 HIGH-RISK PREGNANCY VISIT Patient name: Anna [name] MRN [id number]  Date of birth: April 30, 1995 Chief Complaint:   Routine Prenatal Visit  History of Present Illness:   Anna [name] is a 28 y.o. G48P1011 female at [redacted]w[redacted]d with an Estimated Date of Delivery: 07/12/23 being seen today for ongoing management of a high-risk pregnancy complicated by chronic hypertension w/ h/o severe pre-e currently on nifedipine  30mg  daily, diabetes mellitus A1DM, resolved polyhydramnios, cholelithiasis and PG BMI 71.    Today she reports  all sugars wnl, didn't bring log . Denies ha, visual changes, ruq/epigastric pain, n/v.  Had headache last week that wouldn't go away w/ meds, none since. Reports urinary urgency x 1wk, no burning/discomfort.  Contractions: Not present. Vag. Bleeding: None.  Movement: Present. denies leaking of fluid.      05/30/2023    2:54 PM 01/01/2023   11:22 AM 03/24/2020    2:32 PM 03/21/2017    2:18 PM 12/04/2016    2:28 PM  Depression screen PHQ 2/9  Decreased Interest 0 0 0 0 0  Down, Depressed, Hopeless 0 0 0 1 0  PHQ - 2 Score 0 0 0 1 0  Altered sleeping 0 0  2   Tired, decreased energy 0 0  2   Change in appetite 0 0  0   Feeling bad or failure about yourself  0 0  0   Trouble concentrating 0 0  0   Moving slowly or fidgety/restless 0 0  1   Suicidal thoughts 0 0  0   PHQ-9 Score 0 0  6   Difficult doing work/chores Not difficult at all            05/30/2023    2:54 PM 01/01/2023   11:22 AM  GAD 7 : Generalized Anxiety Score  Nervous, Anxious, on Edge 0 0  Control/stop worrying 0 1  Worry too much - different things 0 1  Trouble relaxing 0 1  Restless 0 0  Easily annoyed or irritable 0 0  Afraid - awful might happen 0 1  Total GAD 7 Score 0 4  Anxiety Difficulty Not difficult at all      Review of Systems:   Pertinent items are noted in HPI Denies abnormal vaginal discharge w/ itching/odor/irritation, headaches, visual changes, shortness of breath, chest  pain, abdominal pain, severe nausea/vomiting, or problems with urination or bowel movements unless otherwise stated above. Pertinent History Reviewed:  Reviewed past medical,surgical, social, obstetrical and family history.  Reviewed problem list, medications and allergies. Physical Assessment:   Vitals:   06/03/23 1048 06/03/23 1050  BP: (!) 147/101 (!) 158/106  Pulse: 64   Weight: (!) 410 lb 6.4 oz (186.2 kg)   Body mass index is 72.7 kg/m.           Physical Examination:   General appearance: alert, well appearing, and in no distress  Mental status: alert, oriented to person, place, and time  Skin: warm & dry   Extremities: Edema: Trace    Cardiovascular: normal heart rate noted  Respiratory: normal respiratory effort, no distress  Abdomen: gravid, soft, non-tender  Pelvic: Cervical exam deferred         Fetal Status:     Movement: Present    Fetal Surveillance Testing today: US  34+3 wks,cephalic,BPP 8/8,anterior placenta gr 2,AFI 18 cm,FHR 137 bpm,RI .56,.61,.61=53%   Chaperone: N/A  Results for orders placed or performed in visit on 06/03/23 (from the past 24 hours)  POC Urinalysis  Dipstick OB   Collection Time: 06/03/23 10:49 AM  Result Value Ref Range   Color, UA     Clarity, UA     Glucose, UA Negative Negative   Bilirubin, UA     Ketones, UA neg    Spec Grav, UA     Blood, UA neg    pH, UA     POC,PROTEIN,UA Trace Negative, Trace, Small (1+), Moderate (2+), Large (3+), 4+   Urobilinogen, UA     Nitrite, UA positive    Leukocytes, UA     Appearance     Odor      Assessment & Plan:  High-risk pregnancy: G3P1011 at [redacted]w[redacted]d with an Estimated Date of Delivery: 07/12/23   1) CHTN w/ h/o severe pre-e and pp readmit, unstable, increase nifedipine  to 60mg  daily, continue ASA, check pre-e labs, check bp daily- if >140/90 or pre-e s/s, let us  know. F/U w/ Dr. Emmette Harms as scheduled Wed  2) A1DM, stable, reports all sugars wnl, encouraged to bring log, last EFW 65% @  32w/AFI wnl  3) PGBMI 71> currently 72  4) Resolved polyhydramnios> was 28cm at 24w, 13cm @ 28w, 18cm today  5) Prev c/s> wants RCS, note routed to Dr. Ozan to schedule  6) Cholelithiasis> referred to [facility]  7) UTI> urgency, +nitrites, rx macrobid , send urine cx  Meds:  Meds ordered this encounter  Medications   nitrofurantoin , macrocrystal-monohydrate, (MACROBID ) 100 MG capsule    Sig: Take 1 capsule (100 mg total) by mouth 2 (two) times daily. X 7 days    Dispense:  14 capsule    Refill:  0   NIFEdipine  (ADALAT  CC) 60 MG 24 hr tablet    Sig: Take 1 tablet (60 mg total) by mouth daily.    Dispense:  30 tablet    Refill:  2   Labs/procedures today: U/S, urine culture, and pre-e labs  Treatment Plan:  EFW q4w, 2x/wk testing, RCS 37w  Reviewed: Preterm labor symptoms and general obstetric precautions including but not limited to vaginal bleeding, contractions, leaking of fluid and fetal movement were reviewed in detail with the patient.  All questions were answered. Does have home bp cuff. Office bp cuff given: not applicable. Check bp daily, let us  know if consistently >140 and/or >90.  Follow-up: Return for As scheduled.   Future Appointments  Date Time Provider Department Center  06/05/2023 11:00 AM Tobb, Chyrl Crawford, DO [facility] H&V  06/06/2023 10:10 AM [facility] NURSE [facility]  06/10/2023 10:00 AM [facility] US  [facility] None  06/10/2023 10:50 AM Keene Pastures, DO [facility]  06/13/2023 10:10 AM [facility] NURSE [facility]  06/17/2023 10:45 AM [facility] IMG 2 [facility] None  06/17/2023 11:50 AM Wendelyn Halter, MD [facility]  06/20/2023 10:10 AM [facility] NURSE [facility]  06/25/2023 10:00 AM [facility] US  [facility] None  06/25/2023 10:50 AM Wendelyn Halter, MD [facility]  06/28/2023 10:10 AM [facility] NURSE [facility]  07/01/2023 10:00 AM [facility] US  [facility] None  07/01/2023 10:50 AM Keene Pastures, DO [facility]  07/04/2023 10:10 AM  [facility] NURSE [facility]  07/08/2023 10:00 AM [facility] US  [facility] None  07/11/2023 10:10 AM [facility] NURSE [facility]  09/02/2023 12:00 PM [facility], NP [facility] None    Orders Placed This Encounter  Procedures   Urine Culture   CBC   Comprehensive metabolic panel with GFR   Protein / creatinine ratio, urine   POC Urinalysis  Dipstick OB   Ferd Householder CNM, [facility] 06/03/2023 11:53 AM

## 2023-06-03 NOTE — Progress Notes (Signed)
 US  34+3 wks,cephalic,BPP 8/8,anterior placenta gr 2,AFI 18 cm,FHR 137 bpm,RI .56,.61,.61=53%

## 2023-06-03 NOTE — Patient Instructions (Signed)
 Anna [name], thank you for choosing our office today! We appreciate the opportunity to meet your healthcare needs. You may receive a short survey by mail, e-mail, or through [employer]. If you are happy with your care we would appreciate if you could take just a few minutes to complete the survey questions. We read all of your comments and take your feedback very seriously. Thank you again for choosing our office.  Center for [employer] Team at [facility]  [facility] at [facility] ([address] [place], [place] [address]) Entrance C, located off of [address] Free 24/7 valet parking   CLASSES: Go to [email or web address] to register for classes (childbirth, breastfeeding, waterbirth, infant CPR, daddy bootcamp, etc.)  Call the office [phone]) or go to [facility] if: You begin to have strong, frequent contractions Your water  breaks.  Sometimes it is a big gush of fluid, sometimes it is just a trickle that keeps getting your panties wet or running down your legs You have vaginal bleeding.  It is normal to have a small amount of spotting if your cervix was checked.  You don't feel your baby moving like normal.  If you don't, get you something to eat and drink and lay down and focus on feeling your baby move.   If your baby is still not moving like normal, you should call the office or go to [facility].  Call the office [phone]) or go to [facility] hospital for these signs of pre-eclampsia: Severe headache that does not go away with Tylenol  Visual changes- seeing spots, double, blurred vision Pain under your right breast or upper abdomen that does not go away with Tums or heartburn medicine Nausea and/or vomiting Severe swelling in your hands, feet, and face   Tdap Vaccine It is recommended that you get the Tdap vaccine during the third trimester of EACH pregnancy to help protect your baby from getting pertussis (whooping cough) 27-36 weeks is the BEST time to do  this so that you can pass the protection on to your baby. During pregnancy is better than after pregnancy, but if you are unable to get it during pregnancy it will be offered at the hospital.  You can get this vaccine with us , at the health department, your family doctor, or some local pharmacies Everyone who will be around your baby should also be up-to-date on their vaccines before the baby comes. Adults (who are not pregnant) only need 1 dose of Tdap during adulthood.   [facility] Pediatricians/Family Doctors [facility]): [address] Meg Spina, [phone]           [facility]: [address], [phone]                [facility]): [address], [phone] (call to ask if accepting patients) [facility] Department: [address], [place], [phone]    [facility] Pediatricians/Family Doctors [facility]): [phone], [phone] [facility]: [address] [place], [phone] [facility]: [address], [phone]  [facility] Doctors  [facility]): [phone] [facility]: [address] [phone]   [facility] Doctors [facility]: [address] [phone]  [facility] Family Doctors  [facility]: [phone], [phone]  Home Blood Pressure Monitoring for Patients   Your provider has recommended that you check your  blood pressure (BP) at least once a week at home. If you do not have a blood pressure cuff at home, one will be provided for you. Contact your provider if you have not received your monitor within 1 week.   Helpful Tips for Accurate Home Blood Pressure Checks  Don't smoke, exercise, or drink caffeine  30 minutes before checking your BP Use the restroom before checking your BP (a full bladder can raise your  pressure) Relax in a comfortable upright chair Feet on the ground Left arm resting comfortably on a flat surface at the level of your heart Legs uncrossed Back supported Sit quietly and don't talk Place the cuff on your bare arm Adjust snuggly, so that only two fingertips can fit between your skin and the top of the cuff Check 2 readings separated by at least one minute Keep a log of your BP readings For a visual, please reference this diagram: http://ccnc.care/bpdiagram  Provider Name: [facility]     Phone: [phone]  Zone 1: ALL CLEAR  Continue to monitor your symptoms:  BP reading is less than 140 (top number) or less than 90 (bottom number)  No right upper stomach pain No headaches or seeing spots No feeling nauseated or throwing up No swelling in face and hands  Zone 2: CAUTION Call your doctor's office for any of the following:  BP reading is greater than 140 (top number) or greater than 90 (bottom number)  Stomach pain under your ribs in the middle or right side Headaches or seeing spots Feeling nauseated or throwing up Swelling in face and hands  Zone 3: EMERGENCY  Seek immediate medical care if you have any of the following:  BP reading is greater than160 (top number) or greater than 110 (bottom number) Severe headaches not improving with Tylenol  Serious difficulty catching your breath Any worsening symptoms from Zone 2  Preterm Labor and Birth Information  The normal length of a pregnancy is 39-41 weeks. Preterm labor is when labor starts before 37 completed weeks of pregnancy. What are the risk factors for preterm labor? Preterm labor is more likely to occur in women who: Have certain infections during pregnancy such as a bladder infection, sexually transmitted infection, or infection inside the uterus (chorioamnionitis). Have a shorter-than-normal cervix. Have gone into preterm labor before. Have had surgery on their cervix. Are younger than age 38  or older than age 1. Are African American. Are pregnant with twins or multiple babies (multiple gestation). Take street drugs or smoke while pregnant. Do not gain enough weight while pregnant. Became pregnant shortly after having been pregnant. What are the symptoms of preterm labor? Symptoms of preterm labor include: Cramps similar to those that can happen during a menstrual period. The cramps may happen with diarrhea. Pain in the abdomen or lower back. Regular uterine contractions that may feel like tightening of the abdomen. A feeling of increased pressure in the pelvis. Increased watery or bloody mucus discharge from the vagina. Water  breaking (ruptured amniotic sac). Why is it important to recognize signs of preterm labor? It is important to recognize signs of preterm labor because babies who are born prematurely may not be fully developed. This can put them at an increased risk for: Long-term (chronic) heart and lung problems. Difficulty immediately after birth with regulating body systems, including blood sugar, body temperature, heart rate, and breathing rate. Bleeding in the brain. Cerebral palsy. Learning difficulties. Death. These risks are highest for babies who are born before 34 weeks  of pregnancy. How is preterm labor treated? Treatment depends on the length of your pregnancy, your condition, and the health of your baby. It may involve: Having a stitch (suture) placed in your cervix to prevent your cervix from opening too early (cerclage). Taking or being given medicines, such as: Hormone medicines. These may be given early in pregnancy to help support the pregnancy. Medicine to stop contractions. Medicines to help mature the baby's lungs. These may be prescribed if the risk of delivery is high. Medicines to prevent your baby from developing cerebral palsy. If the labor happens before 34 weeks of pregnancy, you may need to stay in the hospital. What should I do if I  think I am in preterm labor? If you think that you are going into preterm labor, call your health care provider right away. How can I prevent preterm labor in future pregnancies? To increase your chance of having a full-term pregnancy: Do not use any tobacco products, such as cigarettes, chewing tobacco, and e-cigarettes. If you need help quitting, ask your health care provider. Do not use street drugs or medicines that have not been prescribed to you during your pregnancy. Talk with your health care provider before taking any herbal supplements, even if you have been taking them regularly. Make sure you gain a healthy amount of weight during your pregnancy. Watch for infection. If you think that you might have an infection, get it checked right away. Make sure to tell your health care provider if you have gone into preterm labor before. This information is not intended to replace advice given to you by your health care provider. Make sure you discuss any questions you have with your health care provider. Document Revised: 05/09/2018 Document Reviewed: 06/08/2015 Elsevier Patient Education  2020 [employer].

## 2023-06-04 ENCOUNTER — Encounter: Payer: Self-pay | Admitting: Women's Health

## 2023-06-04 LAB — COMPREHENSIVE METABOLIC PANEL WITH GFR
ALT: 33 IU/L — ABNORMAL HIGH (ref 0–32)
AST: 20 IU/L (ref 0–40)
Albumin: 3.3 g/dL — ABNORMAL LOW (ref 4.0–5.0)
Alkaline Phosphatase: 121 IU/L (ref 44–121)
BUN/Creatinine Ratio: 15 (ref 9–23)
BUN: 8 mg/dL (ref 6–20)
Bilirubin Total: 0.4 mg/dL (ref 0.0–1.2)
CO2: 19 mmol/L — ABNORMAL LOW (ref 20–29)
Calcium: 9.1 mg/dL (ref 8.7–10.2)
Chloride: 105 mmol/L (ref 96–106)
Creatinine, Ser: 0.54 mg/dL — ABNORMAL LOW (ref 0.57–1.00)
Globulin, Total: 3 g/dL (ref 1.5–4.5)
Glucose: 76 mg/dL (ref 70–99)
Potassium: 4.3 mmol/L (ref 3.5–5.2)
Sodium: 137 mmol/L (ref 134–144)
Total Protein: 6.3 g/dL (ref 6.0–8.5)
eGFR: 129 mL/min/1.73

## 2023-06-04 LAB — CBC
Hematocrit: 33.8 % — ABNORMAL LOW (ref 34.0–46.6)
Hemoglobin: 10.8 g/dL — ABNORMAL LOW (ref 11.1–15.9)
MCH: 25.5 pg — ABNORMAL LOW (ref 26.6–33.0)
MCHC: 32 g/dL (ref 31.5–35.7)
MCV: 80 fL (ref 79–97)
Platelets: 295 x10E3/uL (ref 150–450)
RBC: 4.23 x10E6/uL (ref 3.77–5.28)
RDW: 13.8 % (ref 11.7–15.4)
WBC: 6 x10E3/uL (ref 3.4–10.8)

## 2023-06-04 LAB — PROTEIN / CREATININE RATIO, URINE
Creatinine, Urine: 108.8 mg/dL
Protein, Ur: 27.2 mg/dL
Protein/Creat Ratio: 250 mg/g{creat} — ABNORMAL HIGH (ref 0–200)

## 2023-06-05 ENCOUNTER — Other Ambulatory Visit: Payer: Self-pay | Admitting: Obstetrics & Gynecology

## 2023-06-05 ENCOUNTER — Ambulatory Visit: Attending: Cardiology | Admitting: Cardiology

## 2023-06-05 DIAGNOSIS — Z6841 Body Mass Index (BMI) 40.0 and over, adult: Secondary | ICD-10-CM

## 2023-06-05 DIAGNOSIS — O24410 Gestational diabetes mellitus in pregnancy, diet controlled: Secondary | ICD-10-CM

## 2023-06-05 DIAGNOSIS — O0993 Supervision of high risk pregnancy, unspecified, third trimester: Secondary | ICD-10-CM

## 2023-06-05 DIAGNOSIS — O10919 Unspecified pre-existing hypertension complicating pregnancy, unspecified trimester: Secondary | ICD-10-CM

## 2023-06-05 NOTE — Progress Notes (Signed)
 Referral for surgery created

## 2023-06-06 ENCOUNTER — Other Ambulatory Visit: Payer: Self-pay | Admitting: Nurse Practitioner

## 2023-06-06 ENCOUNTER — Ambulatory Visit: Payer: Medicaid Other | Admitting: *Deleted

## 2023-06-06 VITALS — BP 127/77 | HR 69

## 2023-06-06 DIAGNOSIS — O10913 Unspecified pre-existing hypertension complicating pregnancy, third trimester: Secondary | ICD-10-CM

## 2023-06-06 DIAGNOSIS — E782 Mixed hyperlipidemia: Secondary | ICD-10-CM

## 2023-06-06 DIAGNOSIS — O10919 Unspecified pre-existing hypertension complicating pregnancy, unspecified trimester: Secondary | ICD-10-CM

## 2023-06-06 DIAGNOSIS — Z3A34 34 weeks gestation of pregnancy: Secondary | ICD-10-CM

## 2023-06-06 DIAGNOSIS — O0993 Supervision of high risk pregnancy, unspecified, third trimester: Secondary | ICD-10-CM

## 2023-06-06 DIAGNOSIS — O24410 Gestational diabetes mellitus in pregnancy, diet controlled: Secondary | ICD-10-CM

## 2023-06-06 MED ORDER — ATORVASTATIN CALCIUM 10 MG PO TABS: 10.0000 mg | ORAL_TABLET | Freq: Every day | ORAL | 0 refills | Status: DC

## 2023-06-06 NOTE — Progress Notes (Signed)
   NURSE VISIT- NST  SUBJECTIVE:  Anna [name] is a 28 y.o. G69P1011 female at [redacted]w[redacted]d, here for a NST for pregnancy complicated by [facility], Diabetes: A1DM, and Morbid obesity (BMI >=40).  She reports active fetal movement, contractions: none, vaginal bleeding: none, membranes: intact.   OBJECTIVE:  BP 127/77   Pulse 69   LMP 10/05/2022   Appears well, no apparent distress  No results found for this or any previous visit (from the past 24 hours).  NST: FHR baseline 135 bpm, Variability: moderate, Accelerations:present, Decelerations:  Absent= Cat 1/reactive Toco: none   ASSESSMENT: G3P1011 at [redacted]w[redacted]d with CHTN, Diabetes: A1DM, and Morbid obesity (BMI >=40) NST reactive  PLAN: EFM strip reviewed by Dr. Randolm Butte   Recommendations: keep next appointment as scheduled    Anna [name]  06/06/2023 12:24 PM

## 2023-06-07 ENCOUNTER — Other Ambulatory Visit: Payer: Self-pay | Admitting: Obstetrics & Gynecology

## 2023-06-07 DIAGNOSIS — O24419 Gestational diabetes mellitus in pregnancy, unspecified control: Secondary | ICD-10-CM

## 2023-06-07 DIAGNOSIS — I10 Essential (primary) hypertension: Secondary | ICD-10-CM

## 2023-06-10 ENCOUNTER — Other Ambulatory Visit: Payer: Medicaid Other

## 2023-06-10 ENCOUNTER — Encounter: Admitting: Obstetrics & Gynecology

## 2023-06-11 ENCOUNTER — Other Ambulatory Visit: Payer: Self-pay | Admitting: Obstetrics & Gynecology

## 2023-06-11 ENCOUNTER — Telehealth: Payer: Self-pay

## 2023-06-11 DIAGNOSIS — O24419 Gestational diabetes mellitus in pregnancy, unspecified control: Secondary | ICD-10-CM

## 2023-06-11 DIAGNOSIS — Z98891 History of uterine scar from previous surgery: Secondary | ICD-10-CM

## 2023-06-11 DIAGNOSIS — Z8759 Personal history of other complications of pregnancy, childbirth and the puerperium: Secondary | ICD-10-CM

## 2023-06-11 DIAGNOSIS — O0990 Supervision of high risk pregnancy, unspecified, unspecified trimester: Secondary | ICD-10-CM

## 2023-06-11 DIAGNOSIS — I10 Essential (primary) hypertension: Secondary | ICD-10-CM

## 2023-06-11 NOTE — Telephone Encounter (Signed)
 Called to introduce myself and invite to postpartum support group on 06/13/2023.

## 2023-06-13 ENCOUNTER — Other Ambulatory Visit: Payer: Medicaid Other

## 2023-06-13 ENCOUNTER — Encounter: Payer: Self-pay | Admitting: Obstetrics & Gynecology

## 2023-06-13 ENCOUNTER — Ambulatory Visit: Admitting: Obstetrics & Gynecology

## 2023-06-13 VITALS — BP 135/86 | HR 64 | Wt 399.4 lb

## 2023-06-13 DIAGNOSIS — O10913 Unspecified pre-existing hypertension complicating pregnancy, third trimester: Secondary | ICD-10-CM

## 2023-06-13 DIAGNOSIS — O26613 Liver and biliary tract disorders in pregnancy, third trimester: Secondary | ICD-10-CM

## 2023-06-13 DIAGNOSIS — Z6841 Body Mass Index (BMI) 40.0 and over, adult: Secondary | ICD-10-CM

## 2023-06-13 DIAGNOSIS — K8020 Calculus of gallbladder without cholecystitis without obstruction: Secondary | ICD-10-CM

## 2023-06-13 DIAGNOSIS — O0993 Supervision of high risk pregnancy, unspecified, third trimester: Secondary | ICD-10-CM

## 2023-06-13 DIAGNOSIS — Z3A35 35 weeks gestation of pregnancy: Secondary | ICD-10-CM | POA: Diagnosis not present

## 2023-06-13 DIAGNOSIS — Z98891 History of uterine scar from previous surgery: Secondary | ICD-10-CM

## 2023-06-13 DIAGNOSIS — O24410 Gestational diabetes mellitus in pregnancy, diet controlled: Secondary | ICD-10-CM | POA: Diagnosis not present

## 2023-06-13 DIAGNOSIS — O10919 Unspecified pre-existing hypertension complicating pregnancy, unspecified trimester: Secondary | ICD-10-CM

## 2023-06-13 NOTE — Progress Notes (Signed)
 HIGH-RISK PREGNANCY VISIT Patient name: Anna [name] MRN [id number]  Date of birth: 09-02-1995 Chief Complaint:   Routine Prenatal Visit  History of Present Illness:   Anna [name] is a 28 y.o. G83P1011 female at [redacted]w[redacted]d with an Estimated Date of Delivery: 07/12/23 being seen today for ongoing management of a high-risk pregnancy complicated by:  -Chronic HTN  -Morbid obesity -Prior C-section -GDMA1 Pt did not bring log -Anemia -UTI- late to start Abx, currently still on medication  Today she reports no complaints.   Contractions: Irritability. Vag. Bleeding: None.  Movement: Present. denies leaking of fluid.      05/30/2023    2:54 PM 01/01/2023   11:22 AM 03/24/2020    2:32 PM 03/21/2017    2:18 PM 12/04/2016    2:28 PM  Depression screen PHQ 2/9  Decreased Interest 0 0 0 0 0  Down, Depressed, Hopeless 0 0 0 1 0  PHQ - 2 Score 0 0 0 1 0  Altered sleeping 0 0  2   Tired, decreased energy 0 0  2   Change in appetite 0 0  0   Feeling bad or failure about yourself  0 0  0   Trouble concentrating 0 0  0   Moving slowly or fidgety/restless 0 0  1   Suicidal thoughts 0 0  0   PHQ-9 Score 0 0  6   Difficult doing work/chores Not difficult at all         Current Outpatient Medications  Medication Instructions   Accu-Chek Softclix Lancets lancets Use as instructed to check blood sugar 4 times daily   aspirin  EC 162 mg, Oral, Daily, Swallow whole.   Blood Glucose Monitoring Suppl (ACCU-CHEK GUIDE ME) w/Device KIT 1 each, Does not apply, 4 times daily   ferrous sulfate  325 mg, Oral, Every other day   glucose blood test strip Use as instructed to check blood sugar four times daily   NIFEdipine  (ADALAT  CC) 60 mg, Oral, Daily   nitrofurantoin  (macrocrystal-monohydrate) (MACROBID ) 100 mg, Oral, 2 times daily, X 7 days   pantoprazole  (PROTONIX ) 40 mg, Oral, Daily   Prenatal MV & Min w/FA-DHA (PRENATAL GUMMIES) 0.18-25 MG CHEW Chew by mouth.     Review of Systems:    Pertinent items are noted in HPI Denies abnormal vaginal discharge w/ itching/odor/irritation, headaches, visual changes, shortness of breath, chest pain, abdominal pain, severe nausea/vomiting, or problems with urination or bowel movements unless otherwise stated above. Pertinent History Reviewed:  Reviewed past medical,surgical, social, obstetrical and family history.  Reviewed problem list, medications and allergies. Physical Assessment:   Vitals:   06/13/23 1353 06/13/23 1355 06/13/23 1428  BP: (!) 177/108 (!) 158/103 135/86  Pulse: 63 64   Weight: (!) 399 lb 6.4 oz (181.2 kg)    Body mass index is 70.75 kg/m.           Physical Examination:   General appearance: alert, well appearing, and in no distress  Mental status: normal mood, behavior, speech, dress, motor activity, and thought processes  Skin: warm & dry   Extremities:      Cardiovascular: normal heart rate noted  Respiratory: normal respiratory effort, no distress  Abdomen: gravid, soft, non-tender  Pelvic: Cervical exam deferred         Fetal Status:     Movement: Present    Fetal Surveillance Testing today: NST   Chaperone: N/A    No results found for this or any previous visit (  from the past 24 hours).    NST being performed due to Chronic HTN   Fetal Monitoring:  Baseline: 150 bpm, Variability: moderate, Accelerations: present, The accelerations are >15 bpm and more than 2 in 20 minutes, and Decelerations: Absent     Final diagnosis:   Reactive NST    Assessment & Plan:  High-risk pregnancy: G3P1011 at [redacted]w[redacted]d with an Estimated Date of Delivery: 07/12/23   -Chronic HTN Continue with current medication Reactive NST today, continue twice weekly testing -C-section scheduled for 5/27 -Morbid obesity -Prior C-section Repeat scheduled for May 27 -GDMA1 Encouraged pt to bring every time -Anemia -UTI- repeat culture at next visit  Meds: No orders of the defined types were placed in this  encounter.   Labs/procedures today: NST  Treatment Plan:  as outlined above  Reviewed: Preterm labor symptoms and general obstetric precautions including but not limited to vaginal bleeding, contractions, leaking of fluid and fetal movement were reviewed in detail with the patient.  All questions were answered. Pt has home bp cuff. Check bp weekly, let us  know if >160/110   Follow-up: Return for twice weekly as scheduled.   Future Appointments  Date Time Provider Department Center  06/17/2023 10:45 AM [facility] - FT IMG 2 [facility] None  06/17/2023 11:50 AM Wendelyn Halter, MD [facility]  06/18/2023 10:20 AM Emmette Harms, Chyrl Crawford, DO [facility] H&V  06/20/2023  1:50 PM [facility] NURSE [facility]  09/02/2023 12:00 PM [facility] Kim, NP [facility] None    No orders of the defined types were placed in this encounter.   Izacc Demeyer, DO Attending Obstetrician & Gynecologist, [facility] for [employer], [facility]

## 2023-06-17 ENCOUNTER — Ambulatory Visit: Payer: Medicaid Other | Admitting: Radiology

## 2023-06-17 ENCOUNTER — Inpatient Hospital Stay (HOSPITAL_COMMUNITY)
Admission: AD | Admit: 2023-06-17 | Discharge: 2023-06-17 | Disposition: A | Discharge status: Home / Self Care | Attending: Obstetrics and Gynecology | Admitting: Obstetrics and Gynecology

## 2023-06-17 ENCOUNTER — Ambulatory Visit: Admitting: Obstetrics & Gynecology

## 2023-06-17 ENCOUNTER — Other Ambulatory Visit: Payer: Self-pay

## 2023-06-17 ENCOUNTER — Encounter: Payer: Self-pay | Admitting: Obstetrics & Gynecology

## 2023-06-17 ENCOUNTER — Encounter (HOSPITAL_COMMUNITY): Payer: Self-pay | Admitting: Obstetrics and Gynecology

## 2023-06-17 ENCOUNTER — Other Ambulatory Visit (HOSPITAL_COMMUNITY)
Admission: RE | Admit: 2023-06-17 | Discharge: 2023-06-17 | Disposition: A | Discharge status: Home / Self Care | Source: Ambulatory Visit | Attending: Women's Health | Admitting: Women's Health

## 2023-06-17 VITALS — BP 174/101 | HR 62 | Wt 399.8 lb

## 2023-06-17 DIAGNOSIS — I10 Essential (primary) hypertension: Secondary | ICD-10-CM

## 2023-06-17 DIAGNOSIS — O0993 Supervision of high risk pregnancy, unspecified, third trimester: Secondary | ICD-10-CM | POA: Insufficient documentation

## 2023-06-17 DIAGNOSIS — Z3A36 36 weeks gestation of pregnancy: Secondary | ICD-10-CM | POA: Insufficient documentation

## 2023-06-17 DIAGNOSIS — Z98891 History of uterine scar from previous surgery: Secondary | ICD-10-CM

## 2023-06-17 DIAGNOSIS — O0990 Supervision of high risk pregnancy, unspecified, unspecified trimester: Secondary | ICD-10-CM | POA: Diagnosis not present

## 2023-06-17 DIAGNOSIS — Z8759 Personal history of other complications of pregnancy, childbirth and the puerperium: Secondary | ICD-10-CM | POA: Diagnosis not present

## 2023-06-17 DIAGNOSIS — O26893 Other specified pregnancy related conditions, third trimester: Secondary | ICD-10-CM

## 2023-06-17 DIAGNOSIS — O09293 Supervision of pregnancy with other poor reproductive or obstetric history, third trimester: Secondary | ICD-10-CM | POA: Insufficient documentation

## 2023-06-17 DIAGNOSIS — O10013 Pre-existing essential hypertension complicating pregnancy, third trimester: Secondary | ICD-10-CM | POA: Diagnosis present

## 2023-06-17 DIAGNOSIS — O10913 Unspecified pre-existing hypertension complicating pregnancy, third trimester: Secondary | ICD-10-CM

## 2023-06-17 DIAGNOSIS — R519 Headache, unspecified: Secondary | ICD-10-CM | POA: Insufficient documentation

## 2023-06-17 DIAGNOSIS — O24419 Gestational diabetes mellitus in pregnancy, unspecified control: Secondary | ICD-10-CM | POA: Insufficient documentation

## 2023-06-17 DIAGNOSIS — O24410 Gestational diabetes mellitus in pregnancy, diet controlled: Secondary | ICD-10-CM

## 2023-06-17 DIAGNOSIS — D649 Anemia, unspecified: Secondary | ICD-10-CM | POA: Diagnosis not present

## 2023-06-17 DIAGNOSIS — O99013 Anemia complicating pregnancy, third trimester: Secondary | ICD-10-CM | POA: Insufficient documentation

## 2023-06-17 DIAGNOSIS — Z6841 Body Mass Index (BMI) 40.0 and over, adult: Secondary | ICD-10-CM | POA: Diagnosis not present

## 2023-06-17 DIAGNOSIS — D509 Iron deficiency anemia, unspecified: Secondary | ICD-10-CM

## 2023-06-17 DIAGNOSIS — O99213 Obesity complicating pregnancy, third trimester: Secondary | ICD-10-CM | POA: Diagnosis not present

## 2023-06-17 DIAGNOSIS — O10919 Unspecified pre-existing hypertension complicating pregnancy, unspecified trimester: Secondary | ICD-10-CM

## 2023-06-17 HISTORY — DX: Type 2 diabetes mellitus without complications: E11.9

## 2023-06-17 HISTORY — DX: Calculus of gallbladder without cholecystitis without obstruction: K80.20

## 2023-06-17 HISTORY — DX: Unspecified maternal hypertension, complicating the puerperium: O16.5

## 2023-06-17 HISTORY — DX: Fatty (change of) liver, not elsewhere classified: K76.0

## 2023-06-17 LAB — CBC WITH DIFFERENTIAL/PLATELET
Abs Immature Granulocytes: 0.01 10*3/uL (ref 0.00–0.07)
Basophils Absolute: 0 10*3/uL (ref 0.0–0.1)
Basophils Relative: 0 %
Eosinophils Absolute: 0.1 10*3/uL (ref 0.0–0.5)
Eosinophils Relative: 1 %
HCT: 34.7 % — ABNORMAL LOW (ref 36.0–46.0)
Hemoglobin: 10.7 g/dL — ABNORMAL LOW (ref 12.0–15.0)
Immature Granulocytes: 0 %
Lymphocytes Relative: 23 %
Lymphs Abs: 1.3 10*3/uL (ref 0.7–4.0)
MCH: 24.6 pg — ABNORMAL LOW (ref 26.0–34.0)
MCHC: 30.8 g/dL (ref 30.0–36.0)
MCV: 79.8 fL — ABNORMAL LOW (ref 80.0–100.0)
Monocytes Absolute: 0.5 10*3/uL (ref 0.1–1.0)
Monocytes Relative: 9 %
Neutro Abs: 3.9 10*3/uL (ref 1.7–7.7)
Neutrophils Relative %: 67 %
Platelets: 288 10*3/uL (ref 150–400)
RBC: 4.35 MIL/uL (ref 3.87–5.11)
RDW: 14 % (ref 11.5–15.5)
WBC: 5.8 10*3/uL (ref 4.0–10.5)
nRBC: 0 % (ref 0.0–0.2)

## 2023-06-17 LAB — COMPREHENSIVE METABOLIC PANEL WITH GFR
ALT: 40 U/L (ref 0–44)
AST: 24 U/L (ref 15–41)
Albumin: 2.4 g/dL — ABNORMAL LOW (ref 3.5–5.0)
Alkaline Phosphatase: 108 U/L (ref 38–126)
Anion gap: 10 (ref 5–15)
BUN: 6 mg/dL (ref 6–20)
CO2: 22 mmol/L (ref 22–32)
Calcium: 8.7 mg/dL — ABNORMAL LOW (ref 8.9–10.3)
Chloride: 105 mmol/L (ref 98–111)
Creatinine, Ser: 0.62 mg/dL (ref 0.44–1.00)
GFR, Estimated: >60 mL/min (ref >60)
Glucose, Bld: 74 mg/dL (ref 70–99)
Potassium: 3.9 mmol/L (ref 3.5–5.1)
Sodium: 137 mmol/L (ref 135–145)
Total Bilirubin: 0.6 mg/dL (ref 0.0–1.2)
Total Protein: 6.6 g/dL (ref 6.5–8.1)

## 2023-06-17 LAB — PROTEIN / CREATININE RATIO, URINE
Creatinine, Urine: 126 mg/dL
Protein Creatinine Ratio: 0.16 mg/mg{creat} — ABNORMAL HIGH (ref 0.00–0.15)
Total Protein, Urine: 20 mg/dL

## 2023-06-17 MED ORDER — LABETALOL HCL 5 MG/ML IV SOLN: 20.0000 mg | INTRAVENOUS | Status: DC | PRN

## 2023-06-17 MED ORDER — LABETALOL HCL 5 MG/ML IV SOLN: 40.0000 mg | INTRAVENOUS | Status: DC | PRN

## 2023-06-17 MED ORDER — LABETALOL HCL 5 MG/ML IV SOLN: 80.0000 mg | INTRAVENOUS | Status: DC | PRN

## 2023-06-17 MED ORDER — ACETAMINOPHEN-CAFFEINE 500-65 MG PO TABS
2.0000 | ORAL_TABLET | Freq: Once | ORAL | Status: AC
  Administered 2023-06-17: 2 via ORAL
  Filled 2023-06-17: qty 2

## 2023-06-17 MED ORDER — HYDRALAZINE HCL 20 MG/ML IJ SOLN: 10.0000 mg | INTRAMUSCULAR | Status: DC | PRN

## 2023-06-17 MED ORDER — CYCLOBENZAPRINE HCL 5 MG PO TABS
5.0000 mg | ORAL_TABLET | Freq: Three times a day (TID) | ORAL | Status: DC | PRN
  Administered 2023-06-17: 5 mg via ORAL
  Filled 2023-06-17: qty 1

## 2023-06-17 NOTE — Progress Notes (Signed)
 HIGH-RISK PREGNANCY VISIT Patient name: Anna [name] MRN [id number]  Date of birth: 1995/09/04 Chief Complaint:   Routine Prenatal Visit  History of Present Illness:   Anna [name] is a 28 y.o. G41P1011 female at [redacted]w[redacted]d with an Estimated Date of Delivery: 07/12/23 being seen today for ongoing management of a high-risk pregnancy complicated by    ICD-10-CM   1. Supervision of high risk pregnancy, antepartum  O09.90 Cervicovaginal ancillary only( [place])    Culture, beta strep (group b only)    2. [redacted] weeks gestation of pregnancy  Z3A.36 Cervicovaginal ancillary only( [place])    Culture, beta strep (group b only)    3. Chronic hypertension affecting pregnancy: Procardia  XL 60 qd  O10.919    BP exaserbation --> going to MAU for eval of BP + labs, pt aware she may require staying and getting delivered    4. Diet controlled gestational diabetes mellitus: good control  O24.410      .    Today she reports no complaints. Contractions: Irritability. Vag. Bleeding: None.  Movement: Present. denies leaking of fluid.      05/30/2023    2:54 PM 01/01/2023   11:22 AM 03/24/2020    2:32 PM 03/21/2017    2:18 PM 12/04/2016    2:28 PM  Depression screen PHQ 2/9  Decreased Interest 0 0 0 0 0  Down, Depressed, Hopeless 0 0 0 1 0  PHQ - 2 Score 0 0 0 1 0  Altered sleeping 0 0  2   Tired, decreased energy 0 0  2   Change in appetite 0 0  0   Feeling bad or failure about yourself  0 0  0   Trouble concentrating 0 0  0   Moving slowly or fidgety/restless 0 0  1   Suicidal thoughts 0 0  0   PHQ-9 Score 0 0  6   Difficult doing work/chores Not difficult at all            05/30/2023    2:54 PM 01/01/2023   11:22 AM  GAD 7 : Generalized Anxiety Score  Nervous, Anxious, on Edge 0 0  Control/stop worrying 0 1  Worry too much - different things 0 1  Trouble relaxing 0 1  Restless 0 0  Easily annoyed or irritable 0 0  Afraid - awful might happen 0 1  Total GAD 7 Score 0 4   Anxiety Difficulty Not difficult at all      Review of Systems:   Pertinent items are noted in HPI Denies abnormal vaginal discharge w/ itching/odor/irritation, headaches, visual changes, shortness of breath, chest pain, abdominal pain, severe nausea/vomiting, or problems with urination or bowel movements unless otherwise stated above. Pertinent History Reviewed:  Reviewed past medical,surgical, social, obstetrical and family history.  Reviewed problem list, medications and allergies. Physical Assessment:   Vitals:   06/17/23 1103 06/17/23 1122  BP: (!) 164/96 (!) 174/101  Pulse: 72 62  Weight: (!) 399 lb 12.8 oz (181.3 kg)   Body mass index is 70.82 kg/m.           Physical Examination:   General appearance: alert, well appearing, and in no distress  Mental status: alert, oriented to person, place, and time  Skin: warm & dry   Extremities:      Cardiovascular: normal heart rate noted  Respiratory: normal respiratory effort, no distress  Abdomen: gravid, soft, non-tender  Pelvic: Cervical exam deferred  Fetal Status:     Movement: Present    Fetal Surveillance Testing today: BPP 8/8 UAD normal   Chaperone: N/A    No results found for this or any previous visit (from the past 24 hours).  Assessment & Plan:  High-risk pregnancy: G3P1011 at [redacted]w[redacted]d with an Estimated Date of Delivery: 07/12/23      ICD-10-CM   1. Supervision of high risk pregnancy, antepartum  O09.90 Cervicovaginal ancillary only( [place])    Culture, beta strep (group b only)    2. [redacted] weeks gestation of pregnancy  Z3A.36 Cervicovaginal ancillary only( [place])    Culture, beta strep (group b only)    3. Chronic hypertension affecting pregnancy: Procardia  XL 60 qd  O10.919    BP exaserbation --> going to MAU for eval of BP + labs, pt aware she may require staying and getting delivered    4. Diet controlled gestational diabetes mellitus: good control  O24.410       Meds: No orders of the  defined types were placed in this encounter.   Orders:  Orders Placed This Encounter  Procedures   Culture, beta strep (group b only)     Labs/procedures today: U/S  Treatment Plan:  to MAU    Follow-up: Return for to MAU for eval.   Future Appointments  Date Time Provider Department Center  06/17/2023 11:50 AM Wendelyn Halter, MD [facility]  06/18/2023 10:20 AM Jerryl Morin, DO [facility] H&V  06/20/2023  1:50 PM [facility] NURSE [facility]  09/02/2023 12:00 PM [facility], NP [facility] None    Orders Placed This Encounter  Procedures   Culture, beta strep (group b only)   Wendelyn Halter  Attending Physician for the Center for [facility] 06/17/2023 11:27 AM

## 2023-06-17 NOTE — MAU Note (Signed)
 Anna [name] is a 28 y.o. at [redacted]w[redacted]d here in MAU reporting: pt has chronic hypertension on meds. Pt took her meds this morning, BP at appt this morning. Denies HA, visual changes, epigastric pain or increase in swelling.  Reports +FM. Denies bleeding or LOF  Onset of complaint: this morning Pain score: denies There were no vitals filed for this visit.   [id number] +FM, direct to rm Lab orders placed from triage:  pr/cr ratio

## 2023-06-17 NOTE — Progress Notes (Signed)
 US : GA = 36+3 weeks Single active fetus, Cephalic, FHR = 143 bpm, AFI = 8.8 cm, MVP = 3 cm, Anterior pl high, gr1, BPP = 8/8, RI: 0.69, 0.65 83% good EDF,  EFW 43%, AC 61%

## 2023-06-17 NOTE — Progress Notes (Addendum)
 MAU Provider Note  Chief Complaint: Hypertension  Presenting from clinic for HTN and HA.    SUBJECTIVE HPI: Anna [name] is a 28 y.o. [id number] at [redacted]w[redacted]d by LMP c/w US  at 7 weeks who presents to maternity admissions reporting HTN and HA. Pregnancy c/b chronic HTN, gDM, hx pre-eclampsia, hx PPH. Receives [facility] with [facility].  HA behind eyes starting this morning, consistent with headaches she gets at baseline, up to once per week. No vision changes. No abdominal pain. No extremity swelling. No vaginal bleeding or fluid leakage. Reports good FM.    Past Medical History:  Diagnosis Date   Allergy    Diabetes mellitus without complication (HCC)    gestational   Fatty liver disease, nonalcoholic    Gall stones    GERD (gastroesophageal reflux disease)    Hypertension    Morbid obesity with BMI of 70 and over, adult (HCC) 09/29/2017   with hypertension    Postpartum hypertension    with first pregnancy, returned postpartum preeclampsia and required ICU admission   Past Surgical History:  Procedure Laterality Date   CESAREAN SECTION N/A 09/29/2017   Procedure: CESAREAN SECTION;  Surgeon: Albino Hum, MD;  Location: [facility] BIRTHING SUITES;  Service: Obstetrics;  Laterality: N/A;   MYRINGOTOMY     TONSILLECTOMY AND ADENOIDECTOMY     Social History   Socioeconomic History   Marital status: Single    Spouse name: Not on file   Number of children: Not on file   Years of education: Not on file   Highest education level: Not on file  Occupational History   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    Comment: [employer]  Tobacco Use   Smoking status: Never   Smokeless tobacco: Never  Vaping Use   Vaping status: Never Used  Substance and Sexual Activity   Alcohol use: No   Drug use: Yes    Types: Marijuana   Sexual activity: Yes    Birth control/protection: None  Other Topics Concern   Not on file  Social History Narrative   Not on file   Social Drivers of Health    Financial Resource Strain: Low Risk  (01/01/2023)   Overall Financial Resource Strain (CARDIA)    Difficulty of Paying Living Expenses: Not hard at all  Food Insecurity: No Food Insecurity (06/17/2023)   Hunger Vital Sign    Worried About Running Out of Food in the Last Year: Never true    Ran Out of Food in the Last Year: Never true  Transportation Needs: No Transportation Needs (06/17/2023)   PRAPARE - Administrator, Civil Service (Medical): No    Lack of Transportation (Non-Medical): No  Physical Activity: Insufficiently Active (01/01/2023)   Exercise Vital Sign    Days of Exercise per Week: 4 days    Minutes of Exercise per Session: 30 min  Stress: No Stress Concern Present (01/01/2023)   [employer] of Occupational Health - Occupational Stress Questionnaire    Feeling of Stress : Not at all  Social Connections: Moderately Isolated (01/01/2023)   Social Connection and Isolation Panel [NHANES]    Frequency of Communication with Friends and Family: More than three times a week    Frequency of Social Gatherings with Friends and Family: More than three times a week    Attends Religious Services: 1 to 4 times per year    Active Member of [employer] or Organizations: No    Attends Banker  Meetings: Never    Marital Status: Never married  Intimate Partner Violence: Not At Risk (06/17/2023)   Humiliation, Afraid, Rape, and Kick questionnaire    Fear of Current or Ex-Partner: No    Emotionally Abused: No    Physically Abused: No    Sexually Abused: No   No current facility-administered medications on file prior to encounter.   Current Outpatient Medications on File Prior to Encounter  Medication Sig Dispense Refill   aspirin  EC 81 MG tablet Take 2 tablets (162 mg total) by mouth daily. Swallow whole. 180 tablet 2   ferrous sulfate  325 (65 FE) MG tablet Take 1 tablet (325 mg total) by mouth every other day. 45 tablet 2   NIFEdipine  (ADALAT  CC) 60 MG 24 hr  tablet Take 1 tablet (60 mg total) by mouth daily. 30 tablet 2   nitrofurantoin , macrocrystal-monohydrate, (MACROBID ) 100 MG capsule Take 1 capsule (100 mg total) by mouth 2 (two) times daily. X 7 days 14 capsule 0   pantoprazole  (PROTONIX ) 40 MG tablet Take 1 tablet (40 mg total) by mouth daily. 30 tablet 11   Prenatal MV & Min w/FA-DHA (PRENATAL GUMMIES PO) Take 2 capsules by mouth daily.     Accu-Chek Softclix Lancets lancets Use as instructed to check blood sugar 4 times daily 100 each 12   Blood Glucose Monitoring Suppl (ACCU-CHEK GUIDE ME) w/Device KIT 1 each by Does not apply route 4 (four) times daily. 1 kit 0   glucose blood test strip Use as instructed to check blood sugar four times daily 100 each 12   [DISCONTINUED] fluticasone  (FLONASE ) 50 MCG/ACT nasal spray Place 2 sprays into both nostrils daily. (Patient not taking: Reported on 01/26/2019) 16 g 0   Allergies  Allergen Reactions   Diflucan [Fluconazole] Rash    ROS:  Pertinent positives/negatives listed above.  I have reviewed patient's Past Medical Hx, Surgical Hx, Family Hx, Social Hx, medications and allergies.   Physical Exam  Patient Vitals for the past 24 hrs:  BP Temp Temp src Pulse Resp SpO2 Height Weight  06/17/23 1531 128/70 -- -- 69 -- -- -- --  06/17/23 1516 135/74 -- -- 72 -- -- -- --  06/17/23 1501 131/78 -- -- 70 -- -- -- --  06/17/23 1446 120/65 -- -- 65 -- -- -- --  06/17/23 1434 123/61 -- -- 74 -- -- -- --  06/17/23 1416 123/79 -- -- 76 -- -- -- --  06/17/23 1407 135/64 -- -- 77 -- -- -- --  06/17/23 1329 116/71 98.6 F (37 C) Oral 78 18 99 % -- --  06/17/23 1315 -- -- -- -- -- -- 5\' 3"  (1.6 m) (!) 181.1 kg   Constitutional: Well-developed, no acute distress  Cardiovascular: Normal S1/S2. No extra heart sounds. Warm and well-perfused.                                              Respiratory: Breathing comfortably on RA. CTAB anteriorly. Normal WOB.  GI: Abd soft, mild tenderness of LUQ, no rebound  or guarding  MS: Trace ankle edema bilaterally. Nonerythematous, nontender BUE and BLE. 2+ radial and DP pulses Neurologic: Alert and interactive  FHT:  Baseline 140, moderate variability, no decelerations Contractions: absent   LAB RESULTS Results for orders placed or performed during the hospital encounter of 06/17/23 (from the past 24 hours)  Protein / creatinine ratio, urine     Status: Abnormal   Collection Time: 06/17/23  1:09 PM  Result Value Ref Range   Creatinine, Urine 126 mg/dL   Total Protein, Urine 20 mg/dL   Protein Creatinine Ratio 0.16 (H) 0.00 - 0.15 mg/mg[Cre]  Comprehensive metabolic panel     Status: Abnormal   Collection Time: 06/17/23  2:54 PM  Result Value Ref Range   Sodium 137 135 - 145 mmol/L   Potassium 3.9 3.5 - 5.1 mmol/L   Chloride 105 98 - 111 mmol/L   CO2 22 22 - 32 mmol/L   Glucose, Bld 74 70 - 99 mg/dL   BUN 6 6 - 20 mg/dL   Creatinine, Ser 9.14 0.44 - 1.00 mg/dL   Calcium  8.7 (L) 8.9 - 10.3 mg/dL   Total Protein 6.6 6.5 - 8.1 g/dL   Albumin 2.4 (L) 3.5 - 5.0 g/dL   AST 24 15 - 41 U/L   ALT 40 0 - 44 U/L   Alkaline Phosphatase 108 38 - 126 U/L   Total Bilirubin 0.6 0.0 - 1.2 mg/dL   GFR, Estimated >78 >29 mL/min   Anion gap 10 5 - 15  CBC with Differential/Platelet     Status: Abnormal   Collection Time: 06/17/23  2:54 PM  Result Value Ref Range   WBC 5.8 4.0 - 10.5 K/uL   RBC 4.35 3.87 - 5.11 MIL/uL   Hemoglobin 10.7 (L) 12.0 - 15.0 g/dL   HCT 56.2 (L) 13.0 - 86.5 %   MCV 79.8 (L) 80.0 - 100.0 fL   MCH 24.6 (L) 26.0 - 34.0 pg   MCHC 30.8 30.0 - 36.0 g/dL   RDW 78.4 69.6 - 29.5 %   Platelets 288 150 - 400 K/uL   nRBC 0.0 0.0 - 0.2 %   Neutrophils Relative % 67 %   Neutro Abs 3.9 1.7 - 7.7 K/uL   Lymphocytes Relative 23 %   Lymphs Abs 1.3 0.7 - 4.0 K/uL   Monocytes Relative 9 %   Monocytes Absolute 0.5 0.1 - 1.0 K/uL   Eosinophils Relative 1 %   Eosinophils Absolute 0.1 0.0 - 0.5 K/uL   Basophils Relative 0 %   Basophils  Absolute 0.0 0.0 - 0.1 K/uL   Immature Granulocytes 0 %   Abs Immature Granulocytes 0.01 0.00 - 0.07 K/uL    O/Positive/-- (12/03 1428)  IMAGING US  OB Follow Up Result Date: 06/17/2023 Table formatting from the original result was not included. Images from the original result were not included.  ..an Financial trader of Ultrasound Medicine Technical sales engineer) accredited practice Center for [facility] [address] [place] [address] Ordering Provider: Ozan, Jennifer, DO FOLLOW UP SONOGRAM MARITTA KIEF is in the office for a follow up sonogram for EFW/BPP/UAD. She is a 28 y.o. year old [id number] with Estimated Date of Delivery: 07/12/23 by LMP now at  [redacted]w[redacted]d weeks gestation. Thus far the pregnancy has been complicated by BMI72/Hx severe PreE prior preg/GDM/CHTN/C-Sx1. GESTATION: [name] PRESENTATION: cephalic FETAL ACTIVITY:          Heart rate         143 bpm          The fetus is active. AMNIOTIC FLUID: The amniotic fluid volume is  normal, AFI = 8.8 cm, MVP = 3 cm. PLACENTA LOCALIZATION:  anterior GRADE 1 CERVIX: Limited view ADNEXA: Limited view GESTATIONAL AGE AND  BIOMETRICS: Gestational criteria: Estimated Date of Delivery: 07/12/23 by  LMP now at [redacted]w[redacted]d Previous Scans:9          BIPARIETAL DIAMETER           9 cm         36+3 weeks HEAD CIRCUMFERENCE           32 cm         36 weeks ABDOMINAL CIRCUMFERENCE           32.5 cm         36+3 weeks FEMUR LENGTH           6.8 cm         35+1 weeks                                                       AVERAGE EGA(BY THIS SCAN):  36 weeks                                                 ESTIMATED FETAL WEIGHT:       2838  grams, 43 % BIOPHYSICAL PROFILE:                                                                                                      COMMENTS GROSS BODY MOVEMENT                 2  TONE                2  RESPIRATIONS                2  AMNIOTIC FLUID                2                                                           SCORE:  8/8 (Note: NST was not performed as part of this antepartum testing)  DOPPLER FLOW STUDIES: UMBILICAL ARTERY RI RATIOS:   0.69, 0.65    83%  good EDF ANATOMICAL SURVEY                                                                            COMMENTS CEREBRAL VENTRICLES yes normal  CHOROID PLEXUS yes normal  CEREBELLUM yes normal  CISTERNA MAGNA  Yes  normal                           4 CHAMBERED HEART yes Limited view                      DIAPHRAGM yes normal  STOMACH yes normal  RENAL REGION yes normal  BLADDER yes normal      ABDOMINAL CORD INSERTION YES NORMAL                          SUSPECTED ABNORMALITIES:  no QUALITY OF SCAN: Poor resolution due to high BMI and advanced GA TECHNICIAN COMMENTS: US : GA = 36+3 weeks Single active fetus, Cephalic, FHR = 143 bpm, AFI = 8.8 cm, MVP = 3 cm, Anterior pl high, gr1, BPP = 8/8, RI: 0.69, 0.65 83% good EDF,  EFW 43%, AC 61% A copy of this report including all images has been saved and backed up to a second source for retrieval if needed. All measures and details of the anatomical scan, placentation, fluid volume and pelvic anatomy are contained in that report. Brien Can 06/17/2023 11:11 AM Clinical Impression and recommendations: I have reviewed the sonogram results above, combined with the patient's current clinical course, below are my impressions and any appropriate recommendations for management based on the sonographic findings. 1.  [id number] Estimated Date of Delivery: 07/12/23 by serial sonographic evaluations 2.  Fetal sonographic surveillance findings: a). Normal fluid volume b). Normal antepartum fetal assessment with BPP 8/8 c). Normal fetal Doppler ratios with consistent diastolic flow:  61% d). Normal growth percentile with appropriate interval growth:  83% 3.  Normal general sonographic findings Recommend continued prenatal evaluations and care based on this sonogram and as clinically indicated from the patient's clinical course. Wendelyn Halter  06/17/2023 11:14 AM    US  FETAL BPP WO NON STRESS Result Date: 06/17/2023 Table formatting from the original result was not included. Images from the original result were not included.  ..an Financial trader of Ultrasound Medicine Technical sales engineer) accredited practice Center for [facility] [address] [place] [address] Ordering Provider: Ozan, Jennifer, DO FOLLOW UP SONOGRAM SONI KEGEL is in the office for a follow up sonogram for EFW/BPP/UAD. She is a 28 y.o. year old [id number] with Estimated Date of Delivery: 07/12/23 by LMP now at  [redacted]w[redacted]d weeks gestation. Thus far the pregnancy has been complicated by BMI72/Hx severe PreE prior preg/GDM/CHTN/C-Sx1. GESTATION: [name] PRESENTATION: cephalic FETAL ACTIVITY:          Heart rate         143 bpm          The fetus is active. AMNIOTIC FLUID: The amniotic fluid volume is  normal, AFI = 8.8 cm, MVP = 3 cm. PLACENTA LOCALIZATION:  anterior GRADE 1 CERVIX: Limited view ADNEXA: Limited view GESTATIONAL AGE AND  BIOMETRICS: Gestational criteria: Estimated Date of Delivery: 07/12/23 by LMP now at [redacted]w[redacted]d Previous Scans:9          BIPARIETAL DIAMETER           9 cm         36+3 weeks HEAD CIRCUMFERENCE           32 cm         36 weeks ABDOMINAL CIRCUMFERENCE           32.5 cm  36+3 weeks FEMUR LENGTH           6.8 cm         35+1 weeks                                                       AVERAGE EGA(BY THIS SCAN):  36 weeks                                                 ESTIMATED FETAL WEIGHT:       2838  grams, 43 % BIOPHYSICAL PROFILE:                                                                                                      COMMENTS GROSS BODY MOVEMENT                 2  TONE                2  RESPIRATIONS                2  AMNIOTIC FLUID                2                                                          SCORE:  8/8 (Note: NST was not performed as part of this antepartum testing)  DOPPLER FLOW STUDIES: UMBILICAL ARTERY  RI RATIOS:   0.69, 0.65    83%  good EDF ANATOMICAL SURVEY                                                                            COMMENTS CEREBRAL VENTRICLES yes normal  CHOROID PLEXUS yes normal  CEREBELLUM yes normal  CISTERNA MAGNA  Yes  normal                           4 CHAMBERED HEART yes Limited view                      DIAPHRAGM yes normal  STOMACH yes normal  RENAL REGION yes normal  BLADDER yes normal      ABDOMINAL CORD INSERTION YES NORMAL  SUSPECTED ABNORMALITIES:  no QUALITY OF SCAN: Poor resolution due to high BMI and advanced GA TECHNICIAN COMMENTS: US : GA = 36+3 weeks Single active fetus, Cephalic, FHR = 143 bpm, AFI = 8.8 cm, MVP = 3 cm, Anterior pl high, gr1, BPP = 8/8, RI: 0.69, 0.65 83% good EDF,  EFW 43%, AC 61% A copy of this report including all images has been saved and backed up to a second source for retrieval if needed. All measures and details of the anatomical scan, placentation, fluid volume and pelvic anatomy are contained in that report. Brien Can 06/17/2023 11:11 AM Clinical Impression and recommendations: I have reviewed the sonogram results above, combined with the patient's current clinical course, below are my impressions and any appropriate recommendations for management based on the sonographic findings. 1.  [id number] Estimated Date of Delivery: 07/12/23 by serial sonographic evaluations 2.  Fetal sonographic surveillance findings: a). Normal fluid volume b). Normal antepartum fetal assessment with BPP 8/8 c). Normal fetal Doppler ratios with consistent diastolic flow:  61% d). Normal growth percentile with appropriate interval growth:  83% 3.  Normal general sonographic findings Recommend continued prenatal evaluations and care based on this sonogram and as clinically indicated from the patient's clinical course. Wendelyn Halter 06/17/2023 11:14 AM    US  UA Cord Doppler Result Date: 06/17/2023 Table formatting from the original result was not  included. Images from the original result were not included.  ..an Financial trader of Ultrasound Medicine Technical sales engineer) accredited practice Center for [facility] [address] [place] [address] Ordering Provider: Ozan, Jennifer, DO FOLLOW UP SONOGRAM GISELE PACK is in the office for a follow up sonogram for EFW/BPP/UAD. She is a 28 y.o. year old [id number] with Estimated Date of Delivery: 07/12/23 by LMP now at  [redacted]w[redacted]d weeks gestation. Thus far the pregnancy has been complicated by BMI72/Hx severe PreE prior preg/GDM/CHTN/C-Sx1. GESTATION: [name] PRESENTATION: cephalic FETAL ACTIVITY:          Heart rate         143 bpm          The fetus is active. AMNIOTIC FLUID: The amniotic fluid volume is  normal, AFI = 8.8 cm, MVP = 3 cm. PLACENTA LOCALIZATION:  anterior GRADE 1 CERVIX: Limited view ADNEXA: Limited view GESTATIONAL AGE AND  BIOMETRICS: Gestational criteria: Estimated Date of Delivery: 07/12/23 by LMP now at [redacted]w[redacted]d Previous Scans:9          BIPARIETAL DIAMETER           9 cm         36+3 weeks HEAD CIRCUMFERENCE           32 cm         36 weeks ABDOMINAL CIRCUMFERENCE           32.5 cm         36+3 weeks FEMUR LENGTH           6.8 cm         35+1 weeks                                                       AVERAGE EGA(BY THIS SCAN):  36 weeks  ESTIMATED FETAL WEIGHT:       2838  grams, 43 % BIOPHYSICAL PROFILE:                                                                                                      COMMENTS GROSS BODY MOVEMENT                 2  TONE                2  RESPIRATIONS                2  AMNIOTIC FLUID                2                                                          SCORE:  8/8 (Note: NST was not performed as part of this antepartum testing)  DOPPLER FLOW STUDIES: UMBILICAL ARTERY RI RATIOS:   0.69, 0.65    83%  good EDF ANATOMICAL SURVEY                                                                             COMMENTS CEREBRAL VENTRICLES yes normal  CHOROID PLEXUS yes normal  CEREBELLUM yes normal  CISTERNA MAGNA  Yes  normal                           4 CHAMBERED HEART yes Limited view                      DIAPHRAGM yes normal  STOMACH yes normal  RENAL REGION yes normal  BLADDER yes normal      ABDOMINAL CORD INSERTION YES NORMAL                          SUSPECTED ABNORMALITIES:  no QUALITY OF SCAN: Poor resolution due to high BMI and advanced GA TECHNICIAN COMMENTS: US : GA = 36+3 weeks Single active fetus, Cephalic, FHR = 143 bpm, AFI = 8.8 cm, MVP = 3 cm, Anterior pl high, gr1, BPP = 8/8, RI: 0.69, 0.65 83% good EDF,  EFW 43%, AC 61% A copy of this report including all images has been saved and backed up to a second source for retrieval if needed. All measures and details of the anatomical scan, placentation, fluid volume and pelvic anatomy are contained in that report. Brien Can 06/17/2023 11:11 AM Clinical Impression and recommendations: I have reviewed the sonogram results  above, combined with the patient's current clinical course, below are my impressions and any appropriate recommendations for management based on the sonographic findings. 1.  [id number] Estimated Date of Delivery: 07/12/23 by serial sonographic evaluations 2.  Fetal sonographic surveillance findings: a). Normal fluid volume b). Normal antepartum fetal assessment with BPP 8/8 c). Normal fetal Doppler ratios with consistent diastolic flow:  61% d). Normal growth percentile with appropriate interval growth:  83% 3.  Normal general sonographic findings Recommend continued prenatal evaluations and care based on this sonogram and as clinically indicated from the patient's clinical course. Wendelyn Halter 06/17/2023 11:14 AM    US  FETAL BPP WO NON STRESS Result Date: 06/07/2023 Table formatting from the original result was not included. Images from the original result were not included.  ..an [employer] of Ultrasound Medicine Technical sales engineer)  accredited practice Center for [facility] [address] [place] [address] Ordering Provider: Ozan, Jennifer, DO FOLLOW UP SONOGRAM LAYNIE ESPY is in the office for a follow up sonogram for BPP and cord dopplers. She is a 28 y.o. year old [id number] with Estimated Date of Delivery: 07/12/23 by LMP now at  [redacted]w[redacted]d weeks gestation. Thus far the pregnancy has been complicated by CHTN,GDM,obesity. GESTATION: [name] PRESENTATION: cephalic FETAL ACTIVITY:          Heart rate         137          The fetus is active. AMNIOTIC FLUID: The amniotic fluid volume is  normal, 18 cm. PLACENTA LOCALIZATION:  anterior GRADE 2 CERVIX: Limited view GESTATIONAL AGE AND  BIOMETRICS: Gestational criteria: Estimated Date of Delivery: 07/12/23 by LMP now at [redacted]w[redacted]d Previous Scans:7 BIOPHYSICAL PROFILE:                                                                                                      COMMENTS GROSS BODY MOVEMENT                 2  TONE                2  RESPIRATIONS                2  AMNIOTIC FLUID                2                                                          SCORE:  8/8 (Note: NST was not performed as part of this antepartum testing)  DOPPLER FLOW STUDIES: UMBILICAL ARTERY RI RATIOS:   .56,.61,.61=53% ANATOMICAL SURVEY  COMMENTS CEREBRAL VENTRICLES yes normal  CHOROID PLEXUS yes normal  CEREBELLUM yes normal  CISTERNA MAGNA  Yes  normal   CAVUM SEPTI PELLUCIDI YES NORMAL                      4 CHAMBERED HEART no normal Limited view of heart OUTFLOW TRACTS YES normaL  3VV YES NORMAL  3VTV YES NORMAL  SITUS YES NORMAL      DIAPHRAGM yes normal  STOMACH yes normal  RENAL REGION yes normal  BLADDER yes normal          3 VESSEL CORD yes normal              GENITALIA yes normal female     SUSPECTED ABNORMALITIES:  no QUALITY OF SCAN: Limited view TECHNICIAN COMMENTS: US  34+3 wks,cephalic,BPP 8/8,anterior  placenta gr 2,AFI 18 cm,FHR 137 bpm,RI .56,.61,.61=53% A copy of this report including all images has been saved and backed up to a second source for retrieval if needed. All measures and details of the anatomical scan, placentation, fluid volume and pelvic anatomy are contained in that report. Amber Cathy Cobbs 06/03/2023 10:51 AM Clinical Impression and recommendations: I have reviewed the sonogram results above, combined with the patient's current clinical course, below are my impressions and any appropriate recommendations for management based on the sonographic findings. 1.  [id number] Estimated Date of Delivery: 07/12/23 by serial sonographic evaluations 2.  Fetal sonographic surveillance findings: a). Normal fluid volume b). Normal antepartum fetal assessment with BPP 8/8 c). Normal fetal Doppler ratios with consistent diastolic flow:  53% 3.  Normal general sonographic findings Recommend continued prenatal evaluations and care based on this sonogram and as clinically indicated from the patient's clinical course. Jennifer Ozan, DO Attending Obstetrician & Gynecologist, [facility] for [facility], [facility]    US  UA Cord Doppler Result Date: 06/07/2023 Table formatting from the original result was not included. Images from the original result were not included.  ..an [employer] of Ultrasound Medicine Technical sales engineer) accredited practice Center for [facility] [address] [place] [address] Ordering Provider: Ozan, Jennifer, DO FOLLOW UP SONOGRAM ARZU MCGAUGHEY is in the office for a follow up sonogram for BPP and cord dopplers. She is a 28 y.o. year old [id number] with Estimated Date of Delivery: 07/12/23 by LMP now at  [redacted]w[redacted]d weeks gestation. Thus far the pregnancy has been complicated by CHTN,GDM,obesity. GESTATION: [name] PRESENTATION: cephalic FETAL ACTIVITY:          Heart rate         137          The fetus is active. AMNIOTIC FLUID: The amniotic  fluid volume is  normal, 18 cm. PLACENTA LOCALIZATION:  anterior GRADE 2 CERVIX: Limited view GESTATIONAL AGE AND  BIOMETRICS: Gestational criteria: Estimated Date of Delivery: 07/12/23 by LMP now at [redacted]w[redacted]d Previous Scans:7 BIOPHYSICAL PROFILE:  COMMENTS GROSS BODY MOVEMENT                 2  TONE                2  RESPIRATIONS                2  AMNIOTIC FLUID                2                                                          SCORE:  8/8 (Note: NST was not performed as part of this antepartum testing)  DOPPLER FLOW STUDIES: UMBILICAL ARTERY RI RATIOS:   .56,.61,.61=53% ANATOMICAL SURVEY                                                                            COMMENTS CEREBRAL VENTRICLES yes normal  CHOROID PLEXUS yes normal  CEREBELLUM yes normal  CISTERNA MAGNA  Yes  normal   CAVUM SEPTI PELLUCIDI YES NORMAL                      4 CHAMBERED HEART no normal Limited view of heart OUTFLOW TRACTS YES normaL  3VV YES NORMAL  3VTV YES NORMAL  SITUS YES NORMAL      DIAPHRAGM yes normal  STOMACH yes normal  RENAL REGION yes normal  BLADDER yes normal          3 VESSEL CORD yes normal              GENITALIA yes normal female     SUSPECTED ABNORMALITIES:  no QUALITY OF SCAN: Limited view TECHNICIAN COMMENTS: US  34+3 wks,cephalic,BPP 8/8,anterior placenta gr 2,AFI 18 cm,FHR 137 bpm,RI .56,.61,.61=53% A copy of this report including all images has been saved and backed up to a second source for retrieval if needed. All measures and details of the anatomical scan, placentation, fluid volume and pelvic anatomy are contained in that report. Amber Cathy Cobbs 06/03/2023 10:51 AM Clinical Impression and recommendations: I have reviewed the sonogram results above, combined with the patient's current clinical course, below are my impressions and any appropriate recommendations for management based on the sonographic findings. 1.  [id number]  Estimated Date of Delivery: 07/12/23 by serial sonographic evaluations 2.  Fetal sonographic surveillance findings: a). Normal fluid volume b). Normal antepartum fetal assessment with BPP 8/8 c). Normal fetal Doppler ratios with consistent diastolic flow:  53% 3.  Normal general sonographic findings Recommend continued prenatal evaluations and care based on this sonogram and as clinically indicated from the patient's clinical course. Jennifer Ozan, DO Attending Obstetrician & Gynecologist, [facility] for [facility], [facility]    US  FETAL BPP WO NON STRESS Result Date: 05/28/2023 Table formatting from the original result was not included. Images from the original result were not included.  ..an Financial trader of Ultrasound Medicine Technical sales engineer) accredited Airline pilot for [employer] @ [facility] [address]  [address] [place] [address] Ordering Provider: Ozan, Jennifer, DO FOLLOW UP SONOGRAM KIARALIZ RAFUSE is in the office for a follow up sonogram for BPP/UAD. She is a 28 y.o. year old G34P1011 with Estimated Date of Delivery: 07/12/23 by LMP now at  [redacted]w[redacted]d weeks gestation. Thus far the pregnancy has been complicated by BMI71/GDM/CHTN on meds/Hx PE . GESTATION: [name] PRESENTATION: cephalic FETAL ACTIVITY:          Heart rate         132 bpm          The fetus is active. AMNIOTIC FLUID: The amniotic fluid volume is  normal, AFI = 15.5 cm, 57%, MVP = 6 cm. PLACENTA LOCALIZATION:  anterior GRADE 1 CERVIX: Limited view ADNEXA: Limited view GESTATIONAL AGE AND  BIOMETRICS: Gestational criteria: Estimated Date of Delivery: 07/12/23 by LMP now at [redacted]w[redacted]d Previous Scans:6 BIOPHYSICAL PROFILE:                                                                                                      COMMENTS GROSS BODY MOVEMENT                 2  TONE                2  RESPIRATIONS                2  AMNIOTIC FLUID                2                                                           SCORE:  8/8 (Note: NST was not performed as part of this antepartum testing)  DOPPLER FLOW STUDIES: UMBILICAL ARTERY RI RATIOS:   0.61, 0.67    61%  good EDF ANATOMICAL SURVEY                                                                            COMMENTS                                                                 DIAPHRAGM yes normal  STOMACH yes normal  RENAL REGION yes normal  BLADDER yes normal  GENITALIA yes normal female     SUSPECTED ABNORMALITIES:  no QUALITY OF SCAN: Poor resolution due to High BMI TECHNICIAN COMMENTS: US : GA = 33+3 weeks Single active female fetus, cephalic, FHR = 132 bpm, AFI = 15.5 cm, 57%, MVP = 6 cm BPP = 8/8,  RI: 0.61, 0.67  61%  good EDF A copy of this report including all images has been saved and backed up to a second source for retrieval if needed. All measures and details of the anatomical scan, placentation, fluid volume and pelvic anatomy are contained in that report. Brien Can 05/27/2023 2:57 PM Clinical Impression and recommendations: I have reviewed the sonogram results above, combined with the patient's current clinical course, below are my impressions and any appropriate recommendations for management based on the sonographic findings. 1.  [id number] Estimated Date of Delivery: 07/12/23 by serial sonographic evaluations 2.  Fetal sonographic surveillance findings: a). Normal fluid volume b). Normal antepartum fetal assessment with BPP 8/8 c). Normal fetal Doppler ratios with consistent diastolic flow:  61% 3.  Normal general sonographic findings Recommend continued prenatal evaluations and care based on this sonogram and as clinically indicated from the patient's clinical course. Jennifer Ozan, DO Attending Obstetrician & Gynecologist, [facility] for [facility], [facility]    US  UA Cord Doppler Result Date: 05/28/2023 Table formatting from the original result was not included. Images from the original  result were not included.  ..an Financial trader of Ultrasound Medicine Technical sales engineer) accredited practice Center for [facility] @ [facility] [address] [place] [address] Ordering Provider: Ozan, Jennifer, DO FOLLOW UP SONOGRAM MADYSEN FAIRCLOTH is in the office for a follow up sonogram for BPP/UAD. She is a 28 y.o. year old G18P1011 with Estimated Date of Delivery: 07/12/23 by LMP now at  [redacted]w[redacted]d weeks gestation. Thus far the pregnancy has been complicated by BMI71/GDM/CHTN on meds/Hx PE . GESTATION: [name] PRESENTATION: cephalic FETAL ACTIVITY:          Heart rate         132 bpm          The fetus is active. AMNIOTIC FLUID: The amniotic fluid volume is  normal, AFI = 15.5 cm, 57%, MVP = 6 cm. PLACENTA LOCALIZATION:  anterior GRADE 1 CERVIX: Limited view ADNEXA: Limited view GESTATIONAL AGE AND  BIOMETRICS: Gestational criteria: Estimated Date of Delivery: 07/12/23 by LMP now at [redacted]w[redacted]d Previous Scans:6 BIOPHYSICAL PROFILE:                                                                                                      COMMENTS GROSS BODY MOVEMENT                 2  TONE                2  RESPIRATIONS                2  AMNIOTIC FLUID                2  SCORE:  8/8 (Note: NST was not performed as part of this antepartum testing)  DOPPLER FLOW STUDIES: UMBILICAL ARTERY RI RATIOS:   0.61, 0.67    61%  good EDF ANATOMICAL SURVEY                                                                            COMMENTS                                                                 DIAPHRAGM yes normal  STOMACH yes normal  RENAL REGION yes normal  BLADDER yes normal                          GENITALIA yes normal female     SUSPECTED ABNORMALITIES:  no QUALITY OF SCAN: Poor resolution due to High BMI TECHNICIAN COMMENTS: US : GA = 33+3 weeks Single active female fetus, cephalic, FHR = 132 bpm, AFI = 15.5 cm, 57%, MVP = 6 cm BPP = 8/8,  RI: 0.61, 0.67  61%  good  EDF A copy of this report including all images has been saved and backed up to a second source for retrieval if needed. All measures and details of the anatomical scan, placentation, fluid volume and pelvic anatomy are contained in that report. Brien Can 05/27/2023 2:57 PM Clinical Impression and recommendations: I have reviewed the sonogram results above, combined with the patient's current clinical course, below are my impressions and any appropriate recommendations for management based on the sonographic findings. 1.  [id number] Estimated Date of Delivery: 07/12/23 by serial sonographic evaluations 2.  Fetal sonographic surveillance findings: a). Normal fluid volume b). Normal antepartum fetal assessment with BPP 8/8 c). Normal fetal Doppler ratios with consistent diastolic flow:  61% 3.  Normal general sonographic findings Recommend continued prenatal evaluations and care based on this sonogram and as clinically indicated from the patient's clinical course. Jennifer Ozan, DO Attending Obstetrician & Gynecologist, [facility] for [facility], [facility]    US  Fetal BPP W/O Non Stress Result Date: 05/20/2023 Table formatting from the original result was not included. Images from the original result were not included.  ..an Financial trader of Ultrasound Medicine Technical sales engineer) accredited practice Center for [facility] @ [facility] [address] [place] [address] Ordering Provider: Ozan, Jennifer, DO FOLLOW UP SONOGRAM PEGI MILAZZO is in the office for a follow up sonogram for EFW/BPP/UAD. She is a 28 y.o. year old G53P1011 with Estimated Date of Delivery: 07/12/23 by LMP now at  [redacted]w[redacted]d weeks gestation. Thus far the pregnancy has been complicated by BMI73/GDM/CHTN on meds , Hx PE/CSx1 GESTATION: [name] PRESENTATION: cephalic FETAL ACTIVITY:          Heart rate         141 bpm          The fetus is active. AMNIOTIC FLUID: The amniotic fluid volume is  normal,  AFI =  12.5 cm, 30%, MVP = 4.9 cm. PLACENTA LOCALIZATION:  anterior GRADE 1 CERVIX: closed ADNEXA: Limited view GESTATIONAL AGE AND  BIOMETRICS: Gestational criteria: Estimated Date of Delivery: 07/12/23 by LMP now at [redacted]w[redacted]d Previous Scans:5          BIPARIETAL DIAMETER           8.3 cm         33+4 weeks HEAD CIRCUMFERENCE           31.2 cm         34+6 weeks ABDOMINAL CIRCUMFERENCE           28.4 cm         32+3 weeks FEMUR LENGTH           6.6 cm         34 weeks                                                       AVERAGE EGA(BY THIS SCAN):  33+5 weeks                                                 ESTIMATED FETAL WEIGHT:       2149  grams, 65 % BIOPHYSICAL PROFILE:                                                                                                      COMMENTS GROSS BODY MOVEMENT                 2  TONE                2  RESPIRATIONS                2  AMNIOTIC FLUID                2                                                          SCORE:  8/8 (Note: NST was not performed as part of this antepartum testing)  DOPPLER FLOW STUDIES: UMBILICAL ARTERY RI RATIOS:   0.63, 0.63   52% ANATOMICAL SURVEY  COMMENTS CEREBRAL VENTRICLES yes normal  CHOROID PLEXUS yes normal  CEREBELLUM yes normal  CISTERNA MAGNA  Yes  normal   CAVUM SEPTI PELLUCIDI YES NORMAL                      4 CHAMBERED HEART yes normal              SITUS YES NORMAL      DIAPHRAGM yes normal  STOMACH yes normal  RENAL REGION yes normal  BLADDER yes normal          3 VESSEL CORD yes normal              GENITALIA yes normal female     SUSPECTED ABNORMALITIES:  no QUALITY OF SCAN: Poor resolution TECHNICIAN COMMENTS: US : GA = 32+3 weeks Single active female fetus, cephalic, FHR = 141 bpm, AFI = 12.5 cm, 30%, MVP = 4.9 cm, EFW 65%, 2149g, BPP = 8/8, RI: 0.63, 0.63  52%  A copy of this report including all images has been saved and backed up to a second source for  retrieval if needed. All measures and details of the anatomical scan, placentation, fluid volume and pelvic anatomy are contained in that report. Brien Can 05/20/2023 11:33 AM Clinical Impression and recommendations: I have reviewed the sonogram results above, combined with the patient's current clinical course, below are my impressions and any appropriate recommendations for management based on the sonographic findings. 1.  [id number] Estimated Date of Delivery: 07/12/23 by serial sonographic evaluations 2.  Fetal sonographic surveillance findings: a). Normal fluid volume b). Normal antepartum fetal assessment with BPP 8/8 c). Normal fetal Doppler ratios with consistent diastolic flow:  52% d). Normal growth percentile with appropriate interval growth:  65% 3.  Normal general sonographic findings Recommend continued prenatal evaluations and care based on this sonogram and as clinically indicated from the patient's clinical course. Jennifer Ozan, DO Attending Obstetrician & Gynecologist, [facility] for [facility], [facility]    US  OB Follow Up Result Date: 05/20/2023 Table formatting from the original result was not included. Images from the original result were not included.  ..an Financial trader of Ultrasound Medicine Technical sales engineer) accredited practice Center for [facility] @ [facility] [address] [place] [address] Ordering Provider: Ozan, Jennifer, DO FOLLOW UP SONOGRAM SHERLENE RICKEL is in the office for a follow up sonogram for EFW/BPP/UAD. She is a 28 y.o. year old G27P1011 with Estimated Date of Delivery: 07/12/23 by LMP now at  [redacted]w[redacted]d weeks gestation. Thus far the pregnancy has been complicated by BMI73/GDM/CHTN on meds , Hx PE/CSx1 GESTATION: [name] PRESENTATION: cephalic FETAL ACTIVITY:          Heart rate         141 bpm          The fetus is active. AMNIOTIC FLUID: The amniotic fluid volume is  normal, AFI = 12.5 cm, 30%, MVP = 4.9 cm. PLACENTA  LOCALIZATION:  anterior GRADE 1 CERVIX: closed ADNEXA: Limited view GESTATIONAL AGE AND  BIOMETRICS: Gestational criteria: Estimated Date of Delivery: 07/12/23 by LMP now at [redacted]w[redacted]d Previous Scans:5          BIPARIETAL DIAMETER           8.3 cm         33+4 weeks HEAD CIRCUMFERENCE           31.2 cm         34+6  weeks ABDOMINAL CIRCUMFERENCE           28.4 cm         32+3 weeks FEMUR LENGTH           6.6 cm         34 weeks                                                       AVERAGE EGA(BY THIS SCAN):  33+5 weeks                                                 ESTIMATED FETAL WEIGHT:       2149  grams, 65 % BIOPHYSICAL PROFILE:                                                                                                      COMMENTS GROSS BODY MOVEMENT                 2  TONE                2  RESPIRATIONS                2  AMNIOTIC FLUID                2                                                          SCORE:  8/8 (Note: NST was not performed as part of this antepartum testing)  DOPPLER FLOW STUDIES: UMBILICAL ARTERY RI RATIOS:   0.63, 0.63   52% ANATOMICAL SURVEY                                                                            COMMENTS CEREBRAL VENTRICLES yes normal  CHOROID PLEXUS yes normal  CEREBELLUM yes normal  CISTERNA MAGNA  Yes  normal   CAVUM SEPTI PELLUCIDI YES NORMAL                      4 CHAMBERED HEART yes normal              SITUS YES NORMAL      DIAPHRAGM yes normal  STOMACH yes normal  RENAL REGION yes  normal  BLADDER yes normal          3 VESSEL CORD yes normal              GENITALIA yes normal female     SUSPECTED ABNORMALITIES:  no QUALITY OF SCAN: Poor resolution TECHNICIAN COMMENTS: US : GA = 32+3 weeks Single active female fetus, cephalic, FHR = 141 bpm, AFI = 12.5 cm, 30%, MVP = 4.9 cm, EFW 65%, 2149g, BPP = 8/8, RI: 0.63, 0.63  52%  A copy of this report including all images has been saved and backed up to a second source for retrieval if needed. All measures and details of  the anatomical scan, placentation, fluid volume and pelvic anatomy are contained in that report. Brien Can 05/20/2023 11:33 AM Clinical Impression and recommendations: I have reviewed the sonogram results above, combined with the patient's current clinical course, below are my impressions and any appropriate recommendations for management based on the sonographic findings. 1.  [id number] Estimated Date of Delivery: 07/12/23 by serial sonographic evaluations 2.  Fetal sonographic surveillance findings: a). Normal fluid volume b). Normal antepartum fetal assessment with BPP 8/8 c). Normal fetal Doppler ratios with consistent diastolic flow:  52% d). Normal growth percentile with appropriate interval growth:  65% 3.  Normal general sonographic findings Recommend continued prenatal evaluations and care based on this sonogram and as clinically indicated from the patient's clinical course. Jennifer Ozan, DO Attending Obstetrician & Gynecologist, [facility] for [facility], [facility]    US  UA Cord Doppler Result Date: 05/20/2023 Table formatting from the original result was not included. Images from the original result were not included.  ..an Financial trader of Ultrasound Medicine Technical sales engineer) accredited practice Center for [facility] @ [facility] [address] [place] [address] Ordering Provider: Ozan, Jennifer, DO FOLLOW UP SONOGRAM DENA ESPERANZA is in the office for a follow up sonogram for EFW/BPP/UAD. She is a 28 y.o. year old G70P1011 with Estimated Date of Delivery: 07/12/23 by LMP now at  [redacted]w[redacted]d weeks gestation. Thus far the pregnancy has been complicated by BMI73/GDM/CHTN on meds , Hx PE/CSx1 GESTATION: [name] PRESENTATION: cephalic FETAL ACTIVITY:          Heart rate         141 bpm          The fetus is active. AMNIOTIC FLUID: The amniotic fluid volume is  normal, AFI = 12.5 cm, 30%, MVP = 4.9 cm. PLACENTA LOCALIZATION:  anterior GRADE 1 CERVIX: closed  ADNEXA: Limited view GESTATIONAL AGE AND  BIOMETRICS: Gestational criteria: Estimated Date of Delivery: 07/12/23 by LMP now at [redacted]w[redacted]d Previous Scans:5          BIPARIETAL DIAMETER           8.3 cm         33+4 weeks HEAD CIRCUMFERENCE           31.2 cm         34+6 weeks ABDOMINAL CIRCUMFERENCE           28.4 cm         32+3 weeks FEMUR LENGTH           6.6 cm         34 weeks  AVERAGE EGA(BY THIS SCAN):  33+5 weeks                                                 ESTIMATED FETAL WEIGHT:       2149  grams, 65 % BIOPHYSICAL PROFILE:                                                                                                      COMMENTS GROSS BODY MOVEMENT                 2  TONE                2  RESPIRATIONS                2  AMNIOTIC FLUID                2                                                          SCORE:  8/8 (Note: NST was not performed as part of this antepartum testing)  DOPPLER FLOW STUDIES: UMBILICAL ARTERY RI RATIOS:   0.63, 0.63   52% ANATOMICAL SURVEY                                                                            COMMENTS CEREBRAL VENTRICLES yes normal  CHOROID PLEXUS yes normal  CEREBELLUM yes normal  CISTERNA MAGNA  Yes  normal   CAVUM SEPTI PELLUCIDI YES NORMAL                      4 CHAMBERED HEART yes normal              SITUS YES NORMAL      DIAPHRAGM yes normal  STOMACH yes normal  RENAL REGION yes normal  BLADDER yes normal          3 VESSEL CORD yes normal              GENITALIA yes normal female     SUSPECTED ABNORMALITIES:  no QUALITY OF SCAN: Poor resolution TECHNICIAN COMMENTS: US : GA = 32+3 weeks Single active female fetus, cephalic, FHR = 141 bpm, AFI = 12.5 cm, 30%, MVP = 4.9 cm, EFW 65%, 2149g, BPP = 8/8, RI: 0.63, 0.63  52%  A copy of this report including all images has been saved and backed up to a second  source for retrieval if needed. All measures and details of the anatomical scan, placentation, fluid volume  and pelvic anatomy are contained in that report. Brien Can 05/20/2023 11:33 AM Clinical Impression and recommendations: I have reviewed the sonogram results above, combined with the patient's current clinical course, below are my impressions and any appropriate recommendations for management based on the sonographic findings. 1.  [id number] Estimated Date of Delivery: 07/12/23 by serial sonographic evaluations 2.  Fetal sonographic surveillance findings: a). Normal fluid volume b). Normal antepartum fetal assessment with BPP 8/8 c). Normal fetal Doppler ratios with consistent diastolic flow:  52% d). Normal growth percentile with appropriate interval growth:  65% 3.  Normal general sonographic findings Recommend continued prenatal evaluations and care based on this sonogram and as clinically indicated from the patient's clinical course. Jennifer Ozan, DO Attending Obstetrician & Gynecologist, Faculty Practice Center for [employer], [facility]     MAU Management/MDM: Orders Placed This Encounter  Procedures   Protein / creatinine ratio, urine   Comprehensive metabolic panel   CBC with Differential/Platelet   Notify physician (specify) Confirmatory reading of BP> 160/110 15 minutes later   Apply Hypertensive Disorders of Pregnancy Care Plan   Measure blood pressure   Discharge patient Discharge disposition: 01-Home or Self Care; Discharge patient date: 06/17/2023    Meds ordered this encounter  Medications   AND Linked Order Group    labetalol  (NORMODYNE ) injection 20 mg    labetalol  (NORMODYNE ) injection 40 mg    labetalol  (NORMODYNE ) injection 80 mg    hydrALAZINE  (APRESOLINE ) injection 10 mg   acetaminophen -caffeine  (EXCEDRIN TENSION HEADACHE) 500-65 MG per tablet 2 tablet   cyclobenzaprine  (FLEXERIL ) tablet 5 mg     Available prenatal records reviewed.  CBC    Component Value Date/Time   WBC 5.8 06/17/2023 1454   RBC 4.35 06/17/2023 1454   HGB 10.7 (L)  06/17/2023 1454   HGB 10.8 (L) 06/03/2023 1141   HCT 34.7 (L) 06/17/2023 1454   HCT 33.8 (L) 06/03/2023 1141   PLT 288 06/17/2023 1454   PLT 295 06/03/2023 1141   MCV 79.8 (L) 06/17/2023 1454   MCV 80 06/03/2023 1141   MCH 24.6 (L) 06/17/2023 1454   MCHC 30.8 06/17/2023 1454   RDW 14.0 06/17/2023 1454   RDW 13.8 06/03/2023 1141   LYMPHSABS 1.3 06/17/2023 1454   LYMPHSABS CANCELED 05/30/2023 1504   MONOABS 0.5 06/17/2023 1454   EOSABS 0.1 06/17/2023 1454   EOSABS CANCELED 05/30/2023 1504   BASOSABS 0.0 06/17/2023 1454   BASOSABS CANCELED 05/30/2023 1504   CMP     Component Value Date/Time   NA 137 06/17/2023 1454   NA 137 06/03/2023 1141   K 3.9 06/17/2023 1454   CL 105 06/17/2023 1454   CO2 22 06/17/2023 1454   GLUCOSE 74 06/17/2023 1454   BUN 6 06/17/2023 1454   BUN 8 06/03/2023 1141   CREATININE 0.62 06/17/2023 1454   CALCIUM  8.7 (L) 06/17/2023 1454   PROT 6.6 06/17/2023 1454   PROT 6.3 06/03/2023 1141   ALBUMIN 2.4 (L) 06/17/2023 1454   ALBUMIN 3.3 (L) 06/03/2023 1141   AST 24 06/17/2023 1454   ALT 40 06/17/2023 1454   ALKPHOS 108 06/17/2023 1454   BILITOT 0.6 06/17/2023 1454   BILITOT 0.4 06/03/2023 1141   EGFR 129 06/03/2023 1141   GFRNONAA >60 06/17/2023 1454   Urine Protein/Cr ratio: 0.16  ASSESSMENT Chronic hypertension  [redacted] weeks gestation of pregnancy  Microcytic anemia  Chronic HTN  Headache  - Hgb and Plt stable  - Cr wnl  - Protein/Cr ratio mildly elevated at 0.16  - HA resolved with Excedrin and Flexeril    - Normotensive prior to dc from MAU  - Low concern for pre-eclampsia at this time  PLAN Discharge home with strict return precautions.  Patient scheduled for prenatal FU on 5/22 and scheduled for C-section on 5/27.   Allergies as of 06/17/2023       Reactions   Diflucan [fluconazole] Rash        Medication List     TAKE these medications    Accu-Chek Guide Me w/Device Kit 1 each by Does not apply route 4 (four) times  daily.   Accu-Chek Softclix Lancets lancets Use as instructed to check blood sugar 4 times daily   aspirin  EC 81 MG tablet Take 2 tablets (162 mg total) by mouth daily. Swallow whole.   ferrous sulfate  325 (65 FE) MG tablet Take 1 tablet (325 mg total) by mouth every other day.   glucose blood test strip Use as instructed to check blood sugar four times daily   NIFEdipine  60 MG 24 hr tablet Commonly known as: ADALAT  CC Take 1 tablet (60 mg total) by mouth daily.   nitrofurantoin  (macrocrystal-monohydrate) 100 MG capsule Commonly known as: MACROBID  Take 1 capsule (100 mg total) by mouth 2 (two) times daily. X 7 days   pantoprazole  40 MG tablet Commonly known as: Protonix  Take 1 tablet (40 mg total) by mouth daily.   PRENATAL GUMMIES PO Take 2 capsules by mouth daily.        Follow-up Information     [facility] for [facility] Healthcare at [facility] Follow up.   Specialty: Obstetrics and Gynecology Why: Go to routine prenatal care as scheduled. Contact information: [address] [place]  [address] [phone]                Carey Chapman, MD PGY-1 06/17/2023  4:54 PM   GME ATTESTATION:  Evaluation and management procedures were performed by the [facility] Medicine Resident under my supervision. I was immediately available for direct supervision, assistance and direction throughout this encounter.  I also confirm that I have verified the information documented in the resident's note, and that I have also personally reperformed the pertinent components of the physical exam and all of the medical decision making activities.  I have also made any necessary editorial changes.  Darrow End, MD OB Fellow, Faculty Practice [facility], Center for [facility] 06/17/2023 7:41 PM

## 2023-06-18 ENCOUNTER — Encounter: Payer: Self-pay | Admitting: Cardiology

## 2023-06-18 ENCOUNTER — Ambulatory Visit: Attending: Cardiology | Admitting: Cardiology

## 2023-06-18 ENCOUNTER — Telehealth (HOSPITAL_COMMUNITY): Payer: Self-pay | Admitting: *Deleted

## 2023-06-18 VITALS — BP 133/86 | HR 87 | Ht 63.0 in | Wt >= 6400 oz

## 2023-06-18 DIAGNOSIS — Z3A37 37 weeks gestation of pregnancy: Secondary | ICD-10-CM

## 2023-06-18 DIAGNOSIS — O10919 Unspecified pre-existing hypertension complicating pregnancy, unspecified trimester: Secondary | ICD-10-CM

## 2023-06-18 DIAGNOSIS — O24410 Gestational diabetes mellitus in pregnancy, diet controlled: Secondary | ICD-10-CM

## 2023-06-18 LAB — CERVICOVAGINAL ANCILLARY ONLY
Chlamydia: NEGATIVE
Comment: NEGATIVE
Comment: NORMAL
Neisseria Gonorrhea: NEGATIVE

## 2023-06-18 NOTE — Telephone Encounter (Signed)
 Preadmission screen

## 2023-06-18 NOTE — Patient Instructions (Signed)
 Medication Instructions:  Your physician recommends that you continue on your current medications as directed. Please refer to the Current Medication list given to you today.  *If you need a refill on your cardiac medications before your next appointment, please call your pharmacy*   Follow-Up: At [facility], you and your health needs are our priority.  As part of our continuing mission to provide you with exceptional heart care, our providers are all part of one team.  This team includes your primary Cardiologist (physician) and Advanced Practice Providers or APPs (Physician Assistants and Nurse Practitioners) who all work together to provide you with the care you need, when you need it.  Your next appointment:    June 10th 8:40 am   Provider:   Kardie Tobb, DO

## 2023-06-18 NOTE — Patient Instructions (Signed)
  If interested in an outpatient lactation consult in office or virtually please reach out to us  at [facility] (First Floor) [address], [place] Please call [phone] and press 4 for lactation.     Anna [name], [facility] for [facility]

## 2023-06-19 ENCOUNTER — Encounter (HOSPITAL_COMMUNITY): Payer: Self-pay | Admitting: Family Medicine

## 2023-06-19 ENCOUNTER — Encounter (HOSPITAL_COMMUNITY): Payer: Self-pay

## 2023-06-20 ENCOUNTER — Ambulatory Visit: Payer: Medicaid Other

## 2023-06-20 VITALS — BP 135/75 | Wt >= 6400 oz

## 2023-06-20 DIAGNOSIS — I10 Essential (primary) hypertension: Secondary | ICD-10-CM

## 2023-06-20 DIAGNOSIS — Z3A36 36 weeks gestation of pregnancy: Secondary | ICD-10-CM | POA: Diagnosis not present

## 2023-06-20 NOTE — Progress Notes (Signed)
   NURSE VISIT- NST  SUBJECTIVE:  Anna [name] is a 28 y.o. G66P1021 female at [redacted]w[redacted]d, here for a NST for pregnancy complicated by [facility], Diabetes: A1DM, and Morbid obesity (BMI >=40).  She reports active fetal movement, contractions: none, vaginal bleeding: none, membranes: intact.   OBJECTIVE:  BP 135/75   Wt (!) 405 lb (183.7 kg)   LMP 10/05/2022   BMI 71.74 kg/m   Appears well, no apparent distress  No results found for this or any previous visit (from the past 24 hours).  NST: FHR baseline 130 bpm, Variability: moderate, Accelerations:present, Decelerations:  Absent= Cat 1/reactive Toco: none   ASSESSMENT: [id number] at [redacted]w[redacted]d with CHTN, Diabetes: A1DM, and Morbid obesity (BMI >=40) NST reactive  PLAN: EFM strip reviewed by Dr. Randolm [name]   Recommendations: keep next appointment as scheduled    Alyssa Anna [name]  06/20/2023 2:24 PM

## 2023-06-21 LAB — CULTURE, BETA STREP (GROUP B ONLY): Strep Gp B Culture: NEGATIVE

## 2023-06-22 NOTE — Progress Notes (Signed)
 Cardio-Obstetrics Clinic  New Evaluation  Date:  06/22/2023   ID:  SONNET RIZOR, DOB [birth year], MRN [id number]  PCP:  Anton Baton, NP   [facility] Providers Cardiologist:  Jerryl Morin, DO  Electrophysiologist:  None       Referring MD: Lorel Roes, NP   Chief Complaint: " I am ok"   History of Present Illness:    Anna [name] is a 28 y.o. female [G4P1021] who is being seen today for the evaluation of chronic hypertension  at the request of Lorel Roes, NP.   Medical hx of chronic hypertension and preclampsia. She is here for a follow up visit . She is currently [redacted] weeks pregnant.   Prior CV Studies Reviewed: The following studies were reviewed today:   Past Medical History:  Diagnosis Date   Allergy    Diabetes mellitus without complication (HCC)    gestational   Fatty liver disease, nonalcoholic    Gall stones    GERD (gastroesophageal reflux disease)    Gestational diabetes    Hypertension    Morbid obesity with BMI of 70 and over, adult (HCC) 09/29/2017   with hypertension    Postpartum hypertension    with first pregnancy, returned postpartum preeclampsia and required ICU admission    Past Surgical History:  Procedure Laterality Date   CESAREAN SECTION N/A 09/29/2017   Procedure: CESAREAN SECTION;  Surgeon: Albino Hum, MD;  Location: [facility] BIRTHING SUITES;  Service: Obstetrics;  Laterality: N/A;   MYRINGOTOMY     TONSILLECTOMY AND ADENOIDECTOMY        OB History     Gravida  4   Para  1   Term  1   Preterm      AB  2   Living  1      SAB  2   IAB      Ectopic      Multiple      Live Births  1               Current Medications: Current Meds  Medication Sig   Accu-Chek Softclix Lancets lancets Use as instructed to check blood sugar 4 times daily   aspirin  EC 81 MG tablet Take 2 tablets (162 mg total) by mouth daily. Swallow whole.   Blood Glucose Monitoring Suppl (ACCU-CHEK  GUIDE ME) w/Device KIT 1 each by Does not apply route 4 (four) times daily.   ferrous sulfate  325 (65 FE) MG tablet Take 1 tablet (325 mg total) by mouth every other day.   glucose blood test strip Use as instructed to check blood sugar four times daily   NIFEdipine  (ADALAT  CC) 60 MG 24 hr tablet Take 1 tablet (60 mg total) by mouth daily.   pantoprazole  (PROTONIX ) 40 MG tablet Take 1 tablet (40 mg total) by mouth daily.   Prenatal MV & Min w/FA-DHA (PRENATAL GUMMIES PO) Take 2 capsules by mouth daily.   [DISCONTINUED] nitrofurantoin , macrocrystal-monohydrate, (MACROBID ) 100 MG capsule Take 1 capsule (100 mg total) by mouth 2 (two) times daily. 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  Comment: [employer]  Tobacco Use   Smoking status: Never   Smokeless tobacco: Never  Vaping Use   Vaping status: Never Used  Substance and Sexual Activity   Alcohol use: No   Drug use: Yes    Types: Marijuana   Sexual activity: Yes    Birth control/protection: None  Other Topics Concern   Not on file  Social History Narrative   Not on file   Social Drivers of Health   Financial Resource Strain: Low Risk  (01/01/2023)   Overall Financial Resource Strain (CARDIA)    Difficulty of Paying Living Expenses: Not hard at all  Food Insecurity: No Food Insecurity (06/17/2023)   Hunger Vital Sign    Worried About Running Out of Food in the Last Year: Never true    Ran Out of Food in the Last Year: Never true  Transportation Needs: No Transportation Needs (06/17/2023)   PRAPARE - Administrator, Civil Service (Medical): No    Lack of Transportation (Non-Medical): No  Physical Activity: Insufficiently Active  (01/01/2023)   Exercise Vital Sign    Days of Exercise per Week: 4 days    Minutes of Exercise per Session: 30 min  Stress: No Stress Concern Present (01/01/2023)   [employer] of Occupational Health - Occupational Stress Questionnaire    Feeling of Stress : Not at all  Social Connections: Moderately Isolated (01/01/2023)   Social Connection and Isolation Panel [NHANES]    Frequency of Communication with Friends and Family: More than three times a week    Frequency of Social Gatherings with Friends and Family: More than three times a week    Attends Religious Services: 1 to 4 times per year    Active Member of [employer] or Organizations: No    Attends Engineer, structural: Never    Marital Status: Never married      Family History  Problem Relation Age of Onset   Asthma Mother    Anemia Mother    Hypertension Father    Cancer Maternal Grandfather    Hypertension Paternal Grandmother    Heart failure Paternal Grandmother       ROS:   Please see the history of present illness.     All other systems reviewed and are negative.   Labs/EKG Reviewed:    EKG:   EKG was ordered today.    Recent Labs: 06/17/2023: ALT 40; BUN 6; Creatinine, Ser 0.62; Hemoglobin 10.7; Platelets 288; Potassium 3.9; Sodium 137   Recent Lipid Panel Lab Results  Component Value Date/Time   CHOL 211 (H) 05/30/2023 03:04 PM   TRIG 139 05/30/2023 03:04 PM   HDL 51 05/30/2023 03:04 PM   CHOLHDL 4.1 05/30/2023 03:04 PM   LDLCALC 135 (H) 05/30/2023 03:04 PM    Physical Exam:    VS:  BP 133/86 (BP Location: Right Arm, Patient Position: Sitting, Cuff Size: Normal)   Pulse 87   Ht 5\' 3"  (1.6 m)   Wt (!) 401 lb 12.8 oz (182.3 kg)   LMP 10/05/2022   SpO2 93%   BMI 71.18 kg/m     Wt Readings from Last 3 Encounters:  06/20/23 (!) 405 lb (183.7 kg)  06/18/23 (!) 401 lb 12.8 oz (182.3 kg)  06/17/23 (!) 399 lb 4.8 oz (181.1 kg)     GEN:  Well nourished, well developed in no acute  distress HEENT: Normal NECK: No JVD; No carotid bruits LYMPHATICS: No lymphadenopathy CARDIAC: RRR, no murmurs, rubs,  gallops RESPIRATORY:  Clear to auscultation without rales, wheezing or rhonchi  ABDOMEN: Soft, non-tender, non-distended MUSCULOSKELETAL:  No edema; No deformity  SKIN: Warm and dry NEUROLOGIC:  Alert and oriented x 3 PSYCHIATRIC:  Normal affect    Risk Assessment/Risk Calculators:     CARPREG II Risk Prediction Index Score:  1.  The patient's risk for a primary cardiac event is 5%.   Modified World Health Organization [facility]) Classification of Maternal CV Risk   Class I         ASSESSMENT & PLAN:    Chronic hypertension in pregnancy  Gestation Diabetes  Her blood pressure is at target. We will continue her current medication regimen.   Patient Instructions  Medication Instructions:  Your physician recommends that you continue on your current medications as directed. Please refer to the Current Medication list given to you today.  *If you need a refill on your cardiac medications before your next appointment, please call your pharmacy*   Follow-Up: At [facility], you and your health needs are our priority.  As part of our continuing mission to provide you with exceptional heart care, our providers are all part of one team.  This team includes your primary Cardiologist (physician) and Advanced Practice Providers or APPs (Physician Assistants and Nurse Practitioners) who all work together to provide you with the care you need, when you need it.  Your next appointment:    June 10th 8:40 am   Provider:   Jamarco Zaldivar, DO       Dispo:  No follow-ups on file.   Medication Adjustments/Labs and Tests Ordered: Current medicines are reviewed at length with the patient today.  Concerns regarding medicines are outlined above.  Tests Ordered: No orders of the defined types were placed in this encounter.  Medication Changes: No orders of the defined  types were placed in this encounter.

## 2023-06-23 ENCOUNTER — Inpatient Hospital Stay (HOSPITAL_COMMUNITY)
Admission: AD | Admit: 2023-06-23 | Discharge: 2023-06-23 | Disposition: A | Discharge status: Home / Self Care | Attending: Family Medicine | Admitting: Family Medicine

## 2023-06-23 DIAGNOSIS — Z01818 Encounter for other preprocedural examination: Secondary | ICD-10-CM | POA: Diagnosis present

## 2023-06-23 DIAGNOSIS — Z01812 Encounter for preprocedural laboratory examination: Secondary | ICD-10-CM | POA: Insufficient documentation

## 2023-06-23 LAB — CBC
HCT: 33.6 % — ABNORMAL LOW (ref 36.0–46.0)
Hemoglobin: 10.5 g/dL — ABNORMAL LOW (ref 12.0–15.0)
MCH: 25.4 pg — ABNORMAL LOW (ref 26.0–34.0)
MCHC: 31.3 g/dL (ref 30.0–36.0)
MCV: 81.4 fL (ref 80.0–100.0)
Platelets: 258 10*3/uL (ref 150–400)
RBC: 4.13 MIL/uL (ref 3.87–5.11)
RDW: 14.2 % (ref 11.5–15.5)
WBC: 6.3 10*3/uL (ref 4.0–10.5)
nRBC: 0 % (ref 0.0–0.2)

## 2023-06-23 LAB — TYPE AND SCREEN
ABO/RH(D): O POS
Antibody Screen: NEGATIVE

## 2023-06-23 NOTE — MAU Note (Signed)
 Patient only here for pre-op labs

## 2023-06-24 LAB — RPR: RPR Ser Ql: NONREACTIVE

## 2023-06-25 ENCOUNTER — Encounter (HOSPITAL_COMMUNITY)
Admission: RE | Disposition: A | Discharge status: Home / Self Care | Payer: Self-pay | Source: Home / Self Care | Attending: Family Medicine

## 2023-06-25 ENCOUNTER — Other Ambulatory Visit: Payer: Self-pay

## 2023-06-25 ENCOUNTER — Other Ambulatory Visit: Payer: Medicaid Other

## 2023-06-25 ENCOUNTER — Inpatient Hospital Stay (HOSPITAL_COMMUNITY)
Admission: RE | Admit: 2023-06-25 | Discharge: 2023-06-27 | DRG: 787 | Disposition: A | Discharge status: Home / Self Care | Attending: Family Medicine | Admitting: Family Medicine

## 2023-06-25 ENCOUNTER — Inpatient Hospital Stay (HOSPITAL_COMMUNITY)

## 2023-06-25 ENCOUNTER — Encounter: Admitting: Obstetrics & Gynecology

## 2023-06-25 ENCOUNTER — Encounter (HOSPITAL_COMMUNITY): Payer: Self-pay | Admitting: Family Medicine

## 2023-06-25 DIAGNOSIS — F1290 Cannabis use, unspecified, uncomplicated: Secondary | ICD-10-CM | POA: Diagnosis present

## 2023-06-25 DIAGNOSIS — Z79899 Other long term (current) drug therapy: Secondary | ICD-10-CM

## 2023-06-25 DIAGNOSIS — O403XX0 Polyhydramnios, third trimester, not applicable or unspecified: Secondary | ICD-10-CM | POA: Diagnosis not present

## 2023-06-25 DIAGNOSIS — O99214 Obesity complicating childbirth: Secondary | ICD-10-CM | POA: Diagnosis not present

## 2023-06-25 DIAGNOSIS — O9081 Anemia of the puerperium: Secondary | ICD-10-CM | POA: Diagnosis not present

## 2023-06-25 DIAGNOSIS — O24420 Gestational diabetes mellitus in childbirth, diet controlled: Secondary | ICD-10-CM | POA: Diagnosis not present

## 2023-06-25 DIAGNOSIS — Z7982 Long term (current) use of aspirin: Secondary | ICD-10-CM

## 2023-06-25 DIAGNOSIS — Z3A37 37 weeks gestation of pregnancy: Secondary | ICD-10-CM

## 2023-06-25 DIAGNOSIS — K219 Gastro-esophageal reflux disease without esophagitis: Secondary | ICD-10-CM | POA: Diagnosis not present

## 2023-06-25 DIAGNOSIS — O1092 Unspecified pre-existing hypertension complicating childbirth: Secondary | ICD-10-CM | POA: Diagnosis present

## 2023-06-25 DIAGNOSIS — O34211 Maternal care for low transverse scar from previous cesarean delivery: Secondary | ICD-10-CM

## 2023-06-25 DIAGNOSIS — D62 Acute posthemorrhagic anemia: Secondary | ICD-10-CM | POA: Diagnosis not present

## 2023-06-25 DIAGNOSIS — Z148 Genetic carrier of other disease: Secondary | ICD-10-CM | POA: Diagnosis not present

## 2023-06-25 DIAGNOSIS — O10919 Unspecified pre-existing hypertension complicating pregnancy, unspecified trimester: Principal | ICD-10-CM

## 2023-06-25 DIAGNOSIS — O99324 Drug use complicating childbirth: Secondary | ICD-10-CM | POA: Diagnosis present

## 2023-06-25 DIAGNOSIS — O1002 Pre-existing essential hypertension complicating childbirth: Secondary | ICD-10-CM | POA: Diagnosis not present

## 2023-06-25 DIAGNOSIS — Z8249 Family history of ischemic heart disease and other diseases of the circulatory system: Secondary | ICD-10-CM | POA: Diagnosis not present

## 2023-06-25 DIAGNOSIS — O3429 Maternal care due to uterine scar from other previous surgery: Secondary | ICD-10-CM | POA: Diagnosis not present

## 2023-06-25 DIAGNOSIS — O9962 Diseases of the digestive system complicating childbirth: Secondary | ICD-10-CM | POA: Diagnosis not present

## 2023-06-25 HISTORY — DX: Gestational diabetes mellitus in pregnancy, unspecified control: O24.419

## 2023-06-25 LAB — GLUCOSE, CAPILLARY
Glucose-Capillary: 85 mg/dL (ref 70–99)
Glucose-Capillary: 100 mg/dL — ABNORMAL HIGH (ref 70–99)
Glucose-Capillary: 103 mg/dL — ABNORMAL HIGH (ref 70–99)

## 2023-06-25 LAB — CREATININE, SERUM
Creatinine, Ser: 0.56 mg/dL (ref 0.44–1.00)
GFR, Estimated: >60 mL/min (ref >60)

## 2023-06-25 SURGERY — Surgical Case
Anesthesia: Spinal | Site: Abdomen

## 2023-06-25 MED ORDER — FENTANYL CITRATE (PF) 100 MCG/2ML IJ SOLN
INTRAMUSCULAR | Status: DC | PRN
  Administered 2023-06-25: 10 ug via INTRAVENOUS
  Administered 2023-06-25: 75 ug via INTRAVENOUS

## 2023-06-25 MED ORDER — PHENYLEPHRINE 80 MCG/ML (10ML) SYRINGE FOR IV PUSH (FOR BLOOD PRESSURE SUPPORT)
PREFILLED_SYRINGE | INTRAVENOUS | Status: DC | PRN
  Administered 2023-06-25: 80 ug via INTRAVENOUS

## 2023-06-25 MED ORDER — MENTHOL 3 MG MT LOZG: 1.0000 | LOZENGE | OROMUCOSAL | Status: DC | PRN

## 2023-06-25 MED ORDER — MEASLES, MUMPS & RUBELLA VAC IJ SOLR: 0.5000 mL | Freq: Once | INTRAMUSCULAR | Status: DC

## 2023-06-25 MED ORDER — TRANEXAMIC ACID-NACL 1000-0.7 MG/100ML-% IV SOLN
INTRAVENOUS | Status: AC
Start: 2023-06-25 — End: ?
  Filled 2023-06-25: qty 100

## 2023-06-25 MED ORDER — DIPHENHYDRAMINE HCL 25 MG PO CAPS
25.0000 mg | ORAL_CAPSULE | Freq: Four times a day (QID) | ORAL | Status: DC | PRN
  Administered 2023-06-26: 25 mg via ORAL
  Filled 2023-06-25: qty 1

## 2023-06-25 MED ORDER — WITCH HAZEL-GLYCERIN EX PADS: 1.0000 | MEDICATED_PAD | CUTANEOUS | Status: DC | PRN

## 2023-06-25 MED ORDER — FENTANYL CITRATE (PF) 100 MCG/2ML IJ SOLN: 25.0000 ug | INTRAMUSCULAR | Status: DC | PRN

## 2023-06-25 MED ORDER — METOCLOPRAMIDE HCL 5 MG/ML IJ SOLN
INTRAMUSCULAR | Status: AC
Start: 2023-06-25 — End: ?
  Filled 2023-06-25: qty 2

## 2023-06-25 MED ORDER — ACETAMINOPHEN 10 MG/ML IV SOLN: 1000.0000 mg | Freq: Once | INTRAVENOUS | Status: DC | PRN

## 2023-06-25 MED ORDER — MAGNESIUM HYDROXIDE 400 MG/5ML PO SUSP: 30.0000 mL | ORAL | Status: DC | PRN

## 2023-06-25 MED ORDER — KETOROLAC TROMETHAMINE 30 MG/ML IJ SOLN
30.0000 mg | Freq: Four times a day (QID) | INTRAMUSCULAR | Status: AC
  Administered 2023-06-25 – 2023-06-26 (×3): 30 mg via INTRAVENOUS
  Filled 2023-06-25 (×4): qty 1

## 2023-06-25 MED ORDER — BUPIVACAINE IN DEXTROSE 0.75-8.25 % IT SOLN
INTRATHECAL | Status: DC | PRN
  Administered 2023-06-25: 1.6 mL via INTRATHECAL

## 2023-06-25 MED ORDER — NIFEDIPINE ER OSMOTIC RELEASE 30 MG PO TB24
60.0000 mg | ORAL_TABLET | Freq: Every day | ORAL | Status: DC
  Administered 2023-06-26 – 2023-06-27 (×2): 60 mg via ORAL
  Filled 2023-06-25 (×2): qty 2

## 2023-06-25 MED ORDER — DEXMEDETOMIDINE HCL IN NACL 80 MCG/20ML IV SOLN
INTRAVENOUS | Status: DC | PRN
  Administered 2023-06-25 (×4): 4 ug via INTRAVENOUS

## 2023-06-25 MED ORDER — SENNOSIDES-DOCUSATE SODIUM 8.6-50 MG PO TABS
2.0000 | ORAL_TABLET | Freq: Every day | ORAL | Status: DC
  Administered 2023-06-26: 2 via ORAL
  Filled 2023-06-25 (×2): qty 2

## 2023-06-25 MED ORDER — OXYTOCIN-SODIUM CHLORIDE 30-0.9 UT/500ML-% IV SOLN
INTRAVENOUS | Status: AC
Start: 2023-06-25 — End: ?
  Filled 2023-06-25: qty 500

## 2023-06-25 MED ORDER — OXYTOCIN-SODIUM CHLORIDE 30-0.9 UT/500ML-% IV SOLN: 2.5000 [IU]/h | INTRAVENOUS | Status: AC

## 2023-06-25 MED ORDER — MORPHINE SULFATE (PF) 0.5 MG/ML IJ SOLN
INTRAMUSCULAR | Status: DC | PRN
  Administered 2023-06-25: 150 ug via INTRATHECAL

## 2023-06-25 MED ORDER — DEXMEDETOMIDINE HCL IN NACL 80 MCG/20ML IV SOLN
INTRAVENOUS | Status: AC
  Filled 2023-06-25: qty 20

## 2023-06-25 MED ORDER — PRENATAL MULTIVITAMIN CH
1.0000 | ORAL_TABLET | Freq: Every day | ORAL | Status: DC
  Administered 2023-06-26: 1 via ORAL
  Filled 2023-06-25: qty 1

## 2023-06-25 MED ORDER — PHENYLEPHRINE HCL-NACL 20-0.9 MG/250ML-% IV SOLN
INTRAVENOUS | Status: AC
  Filled 2023-06-25: qty 250

## 2023-06-25 MED ORDER — ONDANSETRON HCL 4 MG/2ML IJ SOLN
4.0000 mg | Freq: Once | INTRAMUSCULAR | Status: DC | PRN
Start: 2023-06-25 — End: 2023-06-25

## 2023-06-25 MED ORDER — CEFAZOLIN SODIUM-DEXTROSE 3-4 GM/150ML-% IV SOLN
INTRAVENOUS | Status: AC
  Filled 2023-06-25: qty 150

## 2023-06-25 MED ORDER — LIDOCAINE-EPINEPHRINE (PF) 2 %-1:200000 IJ SOLN
INTRAMUSCULAR | Status: DC | PRN
  Administered 2023-06-25: 5 mL via EPIDURAL
  Administered 2023-06-25: 3 mL via EPIDURAL
  Administered 2023-06-25: 2 mL via EPIDURAL

## 2023-06-25 MED ORDER — ONDANSETRON HCL 4 MG/2ML IJ SOLN
4.0000 mg | Freq: Four times a day (QID) | INTRAMUSCULAR | Status: DC | PRN
  Administered 2023-06-25: 4 mg via INTRAVENOUS
  Filled 2023-06-25: qty 2

## 2023-06-25 MED ORDER — COCONUT OIL OIL: 1.0000 | TOPICAL_OIL | Status: DC | PRN

## 2023-06-25 MED ORDER — FUROSEMIDE 20 MG PO TABS
20.0000 mg | ORAL_TABLET | Freq: Every day | ORAL | Status: DC
  Administered 2023-06-26 – 2023-06-27 (×2): 20 mg via ORAL
  Filled 2023-06-25 (×2): qty 1

## 2023-06-25 MED ORDER — ZOLPIDEM TARTRATE 5 MG PO TABS: 5.0000 mg | ORAL_TABLET | Freq: Every evening | ORAL | Status: DC | PRN

## 2023-06-25 MED ORDER — OXYCODONE HCL 5 MG PO TABS: 5.0000 mg | ORAL_TABLET | ORAL | Status: DC | PRN

## 2023-06-25 MED ORDER — DIBUCAINE (PERIANAL) 1 % EX OINT: 1.0000 | TOPICAL_OINTMENT | CUTANEOUS | Status: DC | PRN

## 2023-06-25 MED ORDER — TRANEXAMIC ACID-NACL 1000-0.7 MG/100ML-% IV SOLN
1000.0000 mg | Freq: Once | INTRAVENOUS | Status: AC
  Administered 2023-06-25 (×2): 1000 mg via INTRAVENOUS

## 2023-06-25 MED ORDER — TRANEXAMIC ACID-NACL 1000-0.7 MG/100ML-% IV SOLN
INTRAVENOUS | Status: AC
  Filled 2023-06-25: qty 100

## 2023-06-25 MED ORDER — PHENYLEPHRINE HCL-NACL 20-0.9 MG/250ML-% IV SOLN
INTRAVENOUS | Status: DC | PRN
  Administered 2023-06-25: 60 ug/min via INTRAVENOUS

## 2023-06-25 MED ORDER — IBUPROFEN 600 MG PO TABS
600.0000 mg | ORAL_TABLET | Freq: Four times a day (QID) | ORAL | Status: DC
  Administered 2023-06-26 – 2023-06-27 (×3): 600 mg via ORAL
  Filled 2023-06-25 (×3): qty 1

## 2023-06-25 MED ORDER — STERILE WATER FOR IRRIGATION IR SOLN
Status: DC | PRN
  Administered 2023-06-25: 1000 mL

## 2023-06-25 MED ORDER — SOD CITRATE-CITRIC ACID 500-334 MG/5ML PO SOLN
ORAL | Status: AC
  Filled 2023-06-25: qty 30

## 2023-06-25 MED ORDER — FENTANYL CITRATE (PF) 100 MCG/2ML IJ SOLN
INTRAMUSCULAR | Status: AC
  Filled 2023-06-25: qty 2

## 2023-06-25 MED ORDER — SIMETHICONE 80 MG PO CHEW: 80.0000 mg | CHEWABLE_TABLET | ORAL | Status: DC | PRN

## 2023-06-25 MED ORDER — SIMETHICONE 80 MG PO CHEW
80.0000 mg | CHEWABLE_TABLET | Freq: Three times a day (TID) | ORAL | Status: DC
  Administered 2023-06-26 – 2023-06-27 (×4): 80 mg via ORAL
  Filled 2023-06-25 (×4): qty 1

## 2023-06-25 MED ORDER — LACTATED RINGERS IV SOLN: INTRAVENOUS | Status: DC

## 2023-06-25 MED ORDER — OXYCODONE HCL 5 MG PO TABS: 5.0000 mg | ORAL_TABLET | Freq: Once | ORAL | Status: DC | PRN

## 2023-06-25 MED ORDER — METOCLOPRAMIDE HCL 5 MG/ML IJ SOLN
INTRAMUSCULAR | Status: DC | PRN
  Administered 2023-06-25: 10 mg via INTRAVENOUS

## 2023-06-25 MED ORDER — CEFAZOLIN SODIUM-DEXTROSE 3-4 GM/150ML-% IV SOLN
3.0000 g | INTRAVENOUS | Status: AC
  Administered 2023-06-25 (×2): 3 g via INTRAVENOUS

## 2023-06-25 MED ORDER — SCOPOLAMINE 1 MG/3DAYS TD PT72
MEDICATED_PATCH | TRANSDERMAL | Status: DC | PRN
  Administered 2023-06-25: 1 via TRANSDERMAL

## 2023-06-25 MED ORDER — ENOXAPARIN SODIUM 100 MG/ML IJ SOSY
90.0000 mg | PREFILLED_SYRINGE | INTRAMUSCULAR | Status: DC
  Administered 2023-06-26 – 2023-06-27 (×2): 90 mg via SUBCUTANEOUS
  Filled 2023-06-25 (×2): qty 0.9

## 2023-06-25 MED ORDER — OXYTOCIN-SODIUM CHLORIDE 30-0.9 UT/500ML-% IV SOLN
INTRAVENOUS | Status: DC | PRN
  Administered 2023-06-25: 300 mL via INTRAVENOUS

## 2023-06-25 MED ORDER — ACETAMINOPHEN 10 MG/ML IV SOLN
INTRAVENOUS | Status: DC | PRN
  Administered 2023-06-25: 1000 mg via INTRAVENOUS

## 2023-06-25 MED ORDER — ONDANSETRON HCL 4 MG/2ML IJ SOLN
INTRAMUSCULAR | Status: DC | PRN
  Administered 2023-06-25: 4 mg via INTRAVENOUS

## 2023-06-25 MED ORDER — GABAPENTIN 100 MG PO CAPS
200.0000 mg | ORAL_CAPSULE | Freq: Every day | ORAL | Status: DC
  Administered 2023-06-26 – 2023-06-27 (×2): 200 mg via ORAL
  Filled 2023-06-25 (×2): qty 2

## 2023-06-25 MED ORDER — ACETAMINOPHEN 500 MG PO TABS
1000.0000 mg | ORAL_TABLET | Freq: Four times a day (QID) | ORAL | Status: DC
  Administered 2023-06-26 – 2023-06-27 (×6): 1000 mg via ORAL
  Filled 2023-06-25 (×7): qty 2

## 2023-06-25 MED ORDER — SOD CITRATE-CITRIC ACID 500-334 MG/5ML PO SOLN
30.0000 mL | ORAL | Status: AC
  Administered 2023-06-25: 30 mL via ORAL

## 2023-06-25 MED ORDER — OXYCODONE HCL 5 MG/5ML PO SOLN: 5.0000 mg | Freq: Once | ORAL | Status: DC | PRN

## 2023-06-25 MED ORDER — MORPHINE SULFATE (PF) 0.5 MG/ML IJ SOLN
INTRAMUSCULAR | Status: AC
  Filled 2023-06-25: qty 10

## 2023-06-25 MED ORDER — ACETAMINOPHEN 10 MG/ML IV SOLN
INTRAVENOUS | Status: AC
  Filled 2023-06-25: qty 100

## 2023-06-25 MED ORDER — KETAMINE HCL 50 MG/5ML IJ SOSY
PREFILLED_SYRINGE | INTRAMUSCULAR | Status: AC
  Filled 2023-06-25: qty 5

## 2023-06-25 MED ORDER — KETAMINE HCL 10 MG/ML IJ SOLN
INTRAMUSCULAR | Status: DC | PRN
  Administered 2023-06-25: 10 mg via INTRAVENOUS

## 2023-06-25 MED ORDER — SODIUM CHLORIDE 0.9 % IR SOLN
Status: DC | PRN
  Administered 2023-06-25: 1000 mL

## 2023-06-25 MED ORDER — FENTANYL CITRATE (PF) 100 MCG/2ML IJ SOLN
INTRAMUSCULAR | Status: DC | PRN
  Administered 2023-06-25: 15 ug via INTRATHECAL

## 2023-06-25 MED ORDER — POTASSIUM CHLORIDE CRYS ER 20 MEQ PO TBCR
20.0000 meq | EXTENDED_RELEASE_TABLET | Freq: Every day | ORAL | Status: DC
  Administered 2023-06-26 – 2023-06-27 (×2): 20 meq via ORAL
  Filled 2023-06-25 (×2): qty 1

## 2023-06-25 MED ORDER — ONDANSETRON HCL 4 MG/2ML IJ SOLN
INTRAMUSCULAR | Status: AC
  Filled 2023-06-25: qty 2

## 2023-06-25 SURGICAL SUPPLY — 34 items
BENZOIN TINCTURE PRP APPL 2/3 (GAUZE/BANDAGES/DRESSINGS) IMPLANT
CANISTER PREVENA PLUS 150 (CANNISTER) IMPLANT
CHLORAPREP W/TINT 26 (MISCELLANEOUS) ×4 IMPLANT
CLAMP UMBILICAL CORD (MISCELLANEOUS) ×2 IMPLANT
CLOTH BEACON ORANGE TIMEOUT ST (SAFETY) ×2 IMPLANT
DRESSING PREVENA PLUS CUSTOM (GAUZE/BANDAGES/DRESSINGS) IMPLANT
DRSG OPSITE POSTOP 4X10 (GAUZE/BANDAGES/DRESSINGS) ×2 IMPLANT
ELECTRODE REM PT RTRN 9FT ADLT (ELECTROSURGICAL) ×2 IMPLANT
EXTRACTOR VACUUM M CUP 4 TUBE (SUCTIONS) IMPLANT
GLOVE BIO SURGEON STRL SZ7.5 (GLOVE) ×2 IMPLANT
GLOVE BIOGEL PI IND STRL 7.0 (GLOVE) ×4 IMPLANT
GLOVE BIOGEL PI IND STRL 8 (GLOVE) ×2 IMPLANT
GOWN STRL REUS W/TWL LRG LVL3 (GOWN DISPOSABLE) ×4 IMPLANT
GOWN STRL REUS W/TWL XL LVL3 (GOWN DISPOSABLE) ×2 IMPLANT
HEMOSTAT ARISTA ABSORB 3G PWDR (HEMOSTASIS) IMPLANT
KIT ABG SYR 3ML LUER SLIP (SYRINGE) IMPLANT
MAT PREVALON FULL STRYKER (MISCELLANEOUS) IMPLANT
NDL HYPO 25X5/8 SAFETYGLIDE (NEEDLE) IMPLANT
NEEDLE HYPO 25X5/8 SAFETYGLIDE (NEEDLE) IMPLANT
NS IRRIG 1000ML POUR BTL (IV SOLUTION) ×2 IMPLANT
PACK C SECTION WH (CUSTOM PROCEDURE TRAY) ×2 IMPLANT
PAD OB MATERNITY 4.3X12.25 (PERSONAL CARE ITEMS) ×2 IMPLANT
RETAINER VISCERAL (MISCELLANEOUS) IMPLANT
RTRCTR C-SECT PINK 25CM LRG (MISCELLANEOUS) ×2 IMPLANT
STRIP CLOSURE SKIN 1/2X4 (GAUZE/BANDAGES/DRESSINGS) IMPLANT
SUT MNCRL 0 VIOLET CTX 36 (SUTURE) ×4 IMPLANT
SUT PDS AB 0 CTX 60 (SUTURE) IMPLANT
SUT VIC AB 0 CTX36XBRD ANBCTRL (SUTURE) ×2 IMPLANT
SUT VIC AB 2-0 CT1 TAPERPNT 27 (SUTURE) ×2 IMPLANT
SUT VIC AB 4-0 KS 27 (SUTURE) ×2 IMPLANT
SUTURE PLAIN GUT 2.0 ETHICON (SUTURE) IMPLANT
TOWEL OR 17X24 6PK STRL BLUE (TOWEL DISPOSABLE) ×2 IMPLANT
TRAY FOLEY W/BAG SLVR 14FR LF (SET/KITS/TRAYS/PACK) ×2 IMPLANT
WATER STERILE IRR 1000ML POUR (IV SOLUTION) ×2 IMPLANT

## 2023-06-25 NOTE — Anesthesia Procedure Notes (Signed)
 Spinal  Patient location during procedure: OR Start time: 06/25/2023 12:33 PM End time: 06/25/2023 12:38 PM Reason for block: surgical anesthesia Staffing Performed: anesthesiologist  Anesthesiologist: Leslye Rast, MD Performed by: Leslye Rast, MD Authorized by: Earvin Goldberg, MD   Preanesthetic Checklist Completed: patient identified, IV checked, site marked, risks and benefits discussed, surgical consent, monitors and equipment checked, pre-op evaluation and timeout performed Spinal Block Patient position: sitting Prep: DuraPrep Patient monitoring: heart rate, cardiac monitor, continuous pulse ox and blood pressure Approach: midline Location: L3-4 Injection technique: single-shot Needle Needle type: Sprotte  Needle gauge: 24 G Needle length: 9 cm Assessment Sensory level: T4 Events: CSF return

## 2023-06-25 NOTE — Discharge Summary (Signed)
 Postpartum Discharge Summary  Date of Service updated***     Patient Name: Anna [name] DOB: February 13, 1995 MRN: [id number]  Date of admission: 06/25/2023 Delivery date:06/25/2023 Delivering provider: Ferdie Housekeeper Date of discharge: 06/25/2023  Admitting diagnosis: Indication for care in labor or delivery [O75.9] Intrauterine pregnancy: [redacted]w[redacted]d     Secondary diagnosis:  Principal Problem:   Indication for care in labor or delivery  Additional problems: ***    Discharge diagnosis: Term Pregnancy Delivered, CHTN, GDM A1, and Polyhydramnios and Morbid Obesity (BMI 72 kg/m2)                                              Post partum procedures:{Postpartum procedures:23558} Augmentation: N/A Complications: None  Hospital course: Sceduled C/S   28 y.o. yo [id number] at [redacted]w[redacted]d was admitted to the hospital 06/25/2023 for scheduled cesarean section with the following indication:Elective Repeat and Hx of Prior CS and Hx of Prior Myomectomy.Delivery details are as follows:  Membrane Rupture Time/Date: 1:28 PM,06/25/2023  Delivery Method:C-Section, Low Transverse Operative Delivery: see separate operative note  Details of operation can be found in separate operative note.  Patient had a postpartum course complicated by***.  She is ambulating, tolerating a regular diet, passing flatus, and urinating well. Patient is discharged home in stable condition on  06/25/23        Newborn Data: Birth date:06/25/2023 Birth time:1:29 PM Gender:Female Living status:Living Apgars:8 ,9  Weight:3090 g    Magnesium  Sulfate received: {Mag received:30440022} BMZ received: No Rhophylac:N/A MMR:N/A T-DaP:Given prenatally Flu: No RSV Vaccine received: No Transfusion:{Transfusion received:30440034}  Immunizations received: Immunization History  Administered Date(s) Administered   DTaP 10/03/1995, 12/04/1995, 01/28/1996, 11/20/1996   HIB (PRP-OMP) 10/03/1995, 12/04/1995, 01/28/1996, 11/20/1996   HIB,  Unspecified 10/03/1995, 12/04/1995, 01/28/1996, 11/20/1996   HPV Quadrivalent 08/26/2007   Hepatitis A 08/28/2013   Hepatitis B 08/05/1995, 10/03/1995, 01/28/1996   Hpv-Unspecified 08/26/2007, 08/28/2013   IPV 10/03/1995, 12/04/1995, 11/20/1996, 08/06/2000   Influenza,inj,Quad PF,6+ Mos 03/21/2017   MMR 08/25/1996, 08/06/2000   Meningococcal Conjugate 08/26/2007, 08/28/2013   Moderna Sars-Covid-2 Vaccination 07/14/2019   OPV 11/20/1996   Tdap 08/28/2013, 07/19/2017, 03/15/2018, 04/22/2023   Varicella 08/25/1996, 08/28/2013    Physical exam  Vitals:   06/19/23 0904 06/25/23 1055  BP:  130/77  Pulse:  79  Resp:  18  Temp:  98.3 F (36.8 C)  SpO2:  98%  Weight: (!) 181 kg (!) 183.7 kg  Height: 5\' 3"  (1.6 m) 5\' 3"  (1.6 m)   General: {Exam; general:21111117} Lochia: {Desc; appropriate/inappropriate:30686::"appropriate"} Uterine Fundus: {Desc; firm/soft:30687} Incision: {Exam; incision:21111123} DVT Evaluation: {Exam; JYN:8295621} Labs: Lab Results  Component Value Date   WBC 6.3 06/23/2023   HGB 10.5 (L) 06/23/2023   HCT 33.6 (L) 06/23/2023   MCV 81.4 06/23/2023   PLT 258 06/23/2023      Latest Ref Rng & Units 06/17/2023    2:54 PM  CMP  Glucose 70 - 99 mg/dL 74   BUN 6 - 20 mg/dL 6   Creatinine 3.08 - 6.57 mg/dL 8.46   Sodium 962 - 952 mmol/L 137   Potassium 3.5 - 5.1 mmol/L 3.9   Chloride 98 - 111 mmol/L 105   CO2 22 - 32 mmol/L 22   Calcium  8.9 - 10.3 mg/dL 8.7   Total Protein 6.5 - 8.1 g/dL 6.6   Total Bilirubin 0.0 - 1.2 mg/dL  0.6   Alkaline Phos 38 - 126 U/L 108   AST 15 - 41 U/L 24   ALT 0 - 44 U/L 40    Edinburgh Score:    11/06/2017    3:25 PM  Edinburgh Postnatal Depression Scale Screening Tool  I have been able to laugh and see the funny side of things. 0  I have looked forward with enjoyment to things. 0  I have blamed myself unnecessarily when things went wrong. 0  I have been anxious or worried for no good reason. 0  I have felt scared or  panicky for no good reason. 0  Things have been getting on top of me. 0  I have been so unhappy that I have had difficulty sleeping. 0  I have felt sad or miserable. 0  The thought of harming myself has occurred to me. 0   No data recorded  After visit meds:  Allergies as of 06/25/2023       Reactions   Diflucan [fluconazole] Rash     Med Rec must be completed prior to using this [facility]***        Discharge home in stable condition Infant Feeding: {Baby feeding:23562} Infant Disposition:{CHL IP OB HOME WITH ZOXWRU:04540} Discharge instruction: per After Visit Summary and Postpartum booklet. Activity: Advance as tolerated. Pelvic rest for 6 weeks.  Diet: {OB JWJX:91478295} Future Appointments: Future Appointments  Date Time Provider Department Center  07/09/2023  8:40 AM Tobb, Kardie, DO [facility]  09/02/2023 12:00 PM [facility], Adell Hones, NP [facility] None   Follow up Visit: Message to FT 06/25/23   Please schedule this patient for a In person postpartum visit in 4 weeks with the following provider: MD. Additional Postpartum F/U:2 hour GTT, Incision check 1 week, and BP check 1 week  High risk pregnancy complicated by: GDM, HTN, and Obesity Delivery mode:  C-Section, Low Transverse Anticipated Birth Control:  Unsure   06/25/2023 Ebony Goldstein, MD

## 2023-06-25 NOTE — Op Note (Cosign Needed)
 Reizy S Reffett PROCEDURE DATE: 06/25/2023  PREOPERATIVE DIAGNOSES: Intrauterine pregnancy at [redacted]w[redacted]d weeks gestation; Election repeat given hx of prior CS and hx of myomectomy  POSTOPERATIVE DIAGNOSES: The same, viable infant delivered  PROCEDURE: Repeat Low Transverse Cesarean Section  SURGEON:  Dr.Cresenzo  ASSISTANT:  Ebony Goldstein, MD & Darrow End, MD, & Attending Dr. Luis Sailors  Both experienced assistants were required given the standard of surgical care given the complexity of the case.  These assistants was needed for exposure, dissection, suctioning, retraction, instrument exchange, assisting with delivery with administration of fundal pressure, and for overall help during the procedure.  ANESTHESIOLOGY TEAM: Anesthesiologist: Leslye Rast, MD; Earvin Goldberg, MD CRNA: Johna Myers, CRNA Student Nurse Anesthetist: Coretta Dexter, RN  INDICATIONS: Anna [name] is a 28 y.o. [phone] at [redacted]w[redacted]d here for cesarean section secondary to the indications listed under preoperative diagnoses; please see preoperative note for further details.  The risks of surgery were discussed with the patient including but were not limited to: bleeding which may require transfusion or reoperation; infection which may require antibiotics; injury to bowel, bladder, ureters or other surrounding organs; injury to the fetus; need for additional procedures including hysterectomy in the event of a life-threatening hemorrhage; formation of adhesions; placental abnormalities wth subsequent pregnancies; incisional problems; thromboembolic phenomenon and other postoperative/anesthesia complications.  The patient concurred with the proposed plan, giving informed written consent for the procedure.    FINDINGS:  Viable female infant in cephalic presentation.  Apgars 8 and 9.  Amniotic fluid: clear.  Intact placenta, three vessel cord.  Normal uterus, fallopian tubes and ovaries bilaterally. Extensive adhesive disease  limiting exposure   ANESTHESIA: spinal INTRAVENOUS FLUIDS: 2000 ml   ESTIMATED BLOOD LOSS: 552 ml URINE OUTPUT:  400 ml SPECIMENS: Placenta sent to L&D . COMPLICATIONS: None immediate  PROCEDURE IN DETAIL:  The patient preoperatively received intravenous antibiotics and had sequential compression devices applied to her lower extremities.  She was then taken to the operating room where spinal anesthesia was found to be adequate. She was then placed in a dorsal supine position with a leftward tilt, and prepped and draped in a sterile manner.  A foley catheter was  placed into her bladder and attached to constant gravity.  After an adequate timeout was performed, a Pfannenstiel skin incision was made with scalpel and carried through to the underlying layer of fascia. The fascia was incised in the midline, and this incision was extended sharply with mayo scissors. The rectus muscles were separated in the midline and the peritoneum was entered bluntly. Second attending Dr. Alto Atta came in to assist Dr. Cresenzo in taking down adhesive disease for insertion of Alexis device.   The Alexis self-retaining retractor was introduced into the abdominal cavity.  Attention was turned to the lower uterine segment where a low transverse hysterotomy was made with a scalpel and extended bluntly in caudad and cephalad directions.  The infant was successfully delivered, the cord was clamped and cut after one minute, and the infant was handed over to the awaiting neonatology team. Uterine massage was then administered, and the placenta delivered intact with a three-vessel cord. The uterus was then cleared of clots and debris.  The hysterotomy was closed with 0-Monocryl in a running fashion.  A Figure-of-eight 0 Vicryl serosal stitch was placed in L corner of hysterotomy to help with hemostasis.    The pelvis was cleared of all clot and debris. Hemostasis was confirmed on all surfaces. Arista and repeat  dosing of TXA used to  ensure continued hemostasis. The uterus incision was once again inspected and found to be hemostatic. The retractor was removed. The fascia was then closed using 0 PDS in a running fashion.  The subcutaneous layer was irrigated, any areas of bleeding were cauterized with the bovie,  was reapproximated with 2-0 plain gut in a running fashion,  a single 2-0 Vicryl mattress suture placed in R apex of subcutaneous layer to assist with re-approximation, was found to be hemostatic.. . The skin was closed with a 4-0 Vicryl subcuticular stitch. The patient tolerated the procedure well. Sponge, instrument and needle counts were correct x 3.  She was taken to the recovery room in stable condition.   Ebony Goldstein, MD FMOB Fellow, Faculty practice [facility], Center for [facility] 06/25/23  2:33 PM

## 2023-06-25 NOTE — H&P (Signed)
 Obstetric Preoperative History and Physical  Anna [name] is a 28 y.o. [id number] with IUP at [redacted]w[redacted]d presenting for scheduled cesarean section.  Reports good fetal movement, no bleeding, no contractions, no leaking of fluid.  No acute preoperative concerns.    Cesarean Section Indication: patient declines vag del attempt and previous myomectomy  Prenatal Course Source of Care: [facility] with onset of care at 12 weeks Pregnancy complications or risks: Patient Active Problem List   Diagnosis Date Noted   Indication for care in labor or delivery 06/25/2023   Iron deficiency anemia secondary to inadequate dietary iron intake 05/30/2023   Pregnant and not yet delivered in third trimester 05/30/2023   Encounter for general adult medical examination with abnormal findings 05/30/2023   Asymptomatic bacteriuria during pregnancy in third trimester 05/20/2023   Polyhydramnios 05/20/2023   Cholelithiasis affecting pregnancy in third trimester, antepartum 05/01/2023   Gestational diabetes 04/01/2023   Alpha thalassemia silent carrier 01/14/2023   Supervision of high-risk pregnancy 01/01/2023   History of cesarean delivery 12/31/2022   Generalized abdominal pain 06/28/2020   Chronic hypertension affecting pregnancy 08/29/2017   Metabolic syndrome X 11/20/2010   Pre-diabetes 07/10/2010   Obesity, morbid, BMI 50 or higher (HCC) 07/10/2010   History of hypertension in pediatric patient 07/10/2010   She plans to breastfeed, plans to bottle feed She desires oral progesterone-only contraceptive for postpartum contraception.   Prenatal labs and studies: ABO, Rh: --/--/O POS (05/25 1330) Antibody: NEG (05/25 1330) Rubella: 10.20 (12/03 1428) RPR: NON REACTIVE (05/25 1330)  HBsAg: Negative (12/03 1428)  HIV: Non Reactive (03/24 1232)  LFY:BOFBPZWC/-- (05/19 1555) 2 hr Glucola abnormal Genetic screening normal Anatomy US  normal female  Prenatal Transfer Tool  Maternal Diabetes: Yes:  Diabetes  Type:  Diet controlled Genetic Screening: Normal Maternal Ultrasounds/Referrals: Normal Fetal Ultrasounds or other Referrals:  None Maternal Substance Abuse:  No Significant Maternal Medications:  None Significant Maternal Lab Results: Group B Strep negative  Past Medical History:  Diagnosis Date   Allergy    Diabetes mellitus without complication (HCC)    gestational   Fatty liver disease, nonalcoholic    Gall stones    GERD (gastroesophageal reflux disease)    Gestational diabetes    Hypertension    Morbid obesity with BMI of 70 and over, adult (HCC) 09/29/2017   with hypertension    Postpartum hypertension    with first pregnancy, returned postpartum preeclampsia and required ICU admission    Past Surgical History:  Procedure Laterality Date   CESAREAN SECTION N/A 09/29/2017   Procedure: CESAREAN SECTION;  Surgeon: Albino Hum, MD;  Location: [facility] BIRTHING SUITES;  Service: Obstetrics;  Laterality: N/A;   MYRINGOTOMY     TONSILLECTOMY AND ADENOIDECTOMY      OB History  Gravida Para Term Preterm AB Living  4 1 1  2 1   SAB IAB Ectopic Multiple Live Births  2    1    # Outcome Date GA Lbr Len/2nd Weight Sex Type Anes PTL Lv  4 Current           3 SAB 2021          2 SAB 08/2018          1 Term 09/29/17 [redacted]w[redacted]d  3459 g F CS-LTranv   LIV     Complications: Failure to Progress in First Stage, Pre-eclampsia    Social History   Socioeconomic History   Marital status: Single    Spouse name: Not on file  Number of children: Not on file   Years of education: Not on file   Highest education level: Not on file  Occupational History   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    Comment: [employer]  Tobacco Use   Smoking status: Never   Smokeless tobacco: Never  Vaping Use   Vaping status: Never Used  Substance and Sexual Activity   Alcohol use: No   Drug use: Yes    Types: Marijuana   Sexual activity: Yes    Birth control/protection: None  Other Topics  Concern   Not on file  Social History Narrative   Not on file   Social Drivers of Health   Financial Resource Strain: Low Risk  (01/01/2023)   Overall Financial Resource Strain (CARDIA)    Difficulty of Paying Living Expenses: Not hard at all  Food Insecurity: No Food Insecurity (06/25/2023)   Hunger Vital Sign    Worried About Running Out of Food in the Last Year: Never true    Ran Out of Food in the Last Year: Never true  Transportation Needs: No Transportation Needs (06/25/2023)   PRAPARE - Administrator, Civil Service (Medical): No    Lack of Transportation (Non-Medical): No  Physical Activity: Insufficiently Active (01/01/2023)   Exercise Vital Sign    Days of Exercise per Week: 4 days    Minutes of Exercise per Session: 30 min  Stress: No Stress Concern Present (01/01/2023)   [employer] of Occupational Health - Occupational Stress Questionnaire    Feeling of Stress : Not at all  Social Connections: Moderately Isolated (01/01/2023)   Social Connection and Isolation Panel [NHANES]    Frequency of Communication with Friends and Family: More than three times a week    Frequency of Social Gatherings with Friends and Family: More than three times a week    Attends Religious Services: 1 to 4 times per year    Active Member of [employer] or Organizations: No    Attends Engineer, structural: Never    Marital Status: Never married    Family History  Problem Relation Age of Onset   Asthma Mother    Anemia Mother    Hypertension Father    Cancer Maternal Grandfather    Hypertension Paternal Grandmother    Heart failure Paternal Grandmother     Medications Prior to Admission  Medication Sig Dispense Refill Last Dose/Taking   aspirin  EC 81 MG tablet Take 2 tablets (162 mg total) by mouth daily. Swallow whole. 180 tablet 2 06/24/2023   ferrous sulfate  325 (65 FE) MG tablet Take 1 tablet (325 mg total) by mouth every other day. 45 tablet 2 06/24/2023   NIFEdipine   (ADALAT  CC) 60 MG 24 hr tablet Take 1 tablet (60 mg total) by mouth daily. 30 tablet 2 06/25/2023   Prenatal MV & Min w/FA-DHA (PRENATAL GUMMIES PO) Take 2 capsules by mouth daily.   06/24/2023   Accu-Chek Softclix Lancets lancets Use as instructed to check blood sugar 4 times daily 100 each 12    Blood Glucose Monitoring Suppl (ACCU-CHEK GUIDE ME) w/Device KIT 1 each by Does not apply route 4 (four) times daily. 1 kit 0    glucose blood [name] strip Use as instructed to check blood sugar four times daily 100 each 12    pantoprazole  (PROTONIX ) 40 MG tablet Take 1 tablet (40 mg total) by mouth daily. 30 tablet 11 More than a month    Allergies  Allergen  Reactions   Diflucan [Fluconazole] Rash    Review of Systems: Pertinent items noted in HPI and remainder of comprehensive ROS otherwise negative.  Physical Exam: BP 130/77 (BP Location: Left Arm)   Pulse 79   Temp 98.3 F (36.8 C)   Resp 18   Ht 5\' 3"  (1.6 m)   Wt (!) 183.7 kg   LMP 10/05/2022   SpO2 98%   BMI 71.72 kg/m  FHR by Doppler: 146 bpm CONSTITUTIONAL: Well-developed, well-nourished female in no acute distress.  HENT:  Normocephalic, atraumatic, External right and left ear normal. Oropharynx is clear and moist EYES: Conjunctivae and EOM are normal. Pupils are equal, round, and reactive to light. No scleral icterus.  NECK: Normal range of motion, supple, no masses SKIN: Skin is warm and dry. No rash noted. Not diaphoretic. No erythema. No pallor. NEUROLOGIC: Alert and oriented to person, place, and time. Normal reflexes, muscle tone coordination. No cranial nerve deficit noted. PSYCHIATRIC: Normal mood and affect. Normal behavior. Normal judgment and thought content. CARDIOVASCULAR: Normal heart rate noted, regular rhythm RESPIRATORY: Effort and breath sounds normal, no problems with respiration noted ABDOMEN: Soft, nontender, nondistended, gravid. Well-healed Pfannenstiel incision. PELVIC: Deferred MUSCULOSKELETAL: Normal  range of motion. No edema and no tenderness. 2+ distal pulses.  Pertinent Labs/Studies:   Results for orders placed or performed during the hospital encounter of 06/25/23 (from the past 72 hours)  Glucose, capillary     Status: None   Collection Time: 06/25/23 11:09 AM  Result Value Ref Range   Glucose-Capillary 85 70 - 99 mg/dL    Comment: Glucose reference range applies only to samples taken after fasting for at least 8 hours.    Assessment and Plan: Anna [name] is a 28 y.o. [id number] at [redacted]w[redacted]d being admitted for scheduled cesarean section. The risks of surgery were discussed with the patient including but were not limited to: bleeding which may require transfusion or reoperation; infection which may require antibiotics; injury to bowel, bladder, ureters or other surrounding organs; injury to the fetus; need for additional procedures including hysterectomy in the event of a life-threatening hemorrhage; formation of adhesions; placental abnormalities wth subsequent pregnancies; incisional problems; thromboembolic phenomenon and other postoperative/anesthesia complications. The patient concurred with the proposed plan, giving informed written consent for the procedure. Patient has been NPO since last night she will remain NPO for procedure. Anesthesia and OR aware. Preoperative prophylactic antibiotics and SCDs ordered on call to the OR. To OR when ready.    Darrow End, MD FMOB Fellow, Faculty practice [facility], Center for [facility] 06/25/23  11:30 AM

## 2023-06-25 NOTE — Anesthesia Postprocedure Evaluation (Signed)
 Anesthesia Post Note  Patient: Florenda S Mayers  Procedure(s) Performed: CESAREAN DELIVERY (Abdomen)     Patient location during evaluation: PACU Anesthesia Type: General Level of consciousness: awake and alert Pain management: pain level controlled Vital Signs Assessment: post-procedure vital signs reviewed and stable Respiratory status: spontaneous breathing, nonlabored ventilation and respiratory function stable Cardiovascular status: blood pressure returned to baseline and stable Postop Assessment: no apparent nausea or vomiting Anesthetic complications: no   No notable events documented.  Last Vitals:  Vitals:   06/25/23 1545 06/25/23 1559  BP: 110/66 118/80  Pulse: (!) 59 (!) 55  Resp: 14   Temp:  36.6 C  SpO2: 94% 98%    Last Pain:  Vitals:   06/25/23 1559  TempSrc: Axillary  PainSc:                  Earvin Goldberg

## 2023-06-25 NOTE — Anesthesia Procedure Notes (Addendum)
 Epidural Patient location during procedure: OB Start time: 06/25/2023 12:33 PM End time: 06/25/2023 12:38 PM  Staffing Anesthesiologist: Leslye Rast, MD Performed: anesthesiologist   Preanesthetic Checklist Completed: patient identified, IV checked, site marked, risks and benefits discussed, surgical consent, monitors and equipment checked, pre-op evaluation and timeout performed  Epidural Patient position: sitting Prep: DuraPrep Patient monitoring: heart rate, continuous pulse ox and blood pressure Approach: midline Location: L4-L5 Injection technique: LOR saline  Needle:  Needle type: Tuohy  Needle gauge: 17 G Needle length: 9 cm Catheter type: closed end flexible Catheter size: 19 Gauge Catheter at skin depth: 10 cm  Assessment Events: blood not aspirated, no cerebrospinal fluid, injection not painful, no injection resistance, no paresthesia and negative IV test  Additional Notes Performed as part of CSEReason for block:procedure for pain

## 2023-06-25 NOTE — Transfer of Care (Signed)
 Immediate Anesthesia Transfer of Care Note  Patient: Anna [name]  Procedure(s) Performed: CESAREAN DELIVERY (Abdomen)  Patient Location: PACU  Anesthesia Type:Spinal and Epidural  Level of Consciousness: awake, alert , and oriented  Airway & Oxygen Therapy: Patient Spontanous Breathing  Post-op Assessment: Report given to RN and Post -op Vital signs reviewed and stable  Post vital signs: Reviewed and stable  Last Vitals:  Vitals Value Taken Time  BP 120/73 06/25/23 1500  Temp    Pulse 54 06/25/23 1500  Resp 16 06/25/23 1501  SpO2 100 % 06/25/23 1500  Vitals shown include unfiled device data.  Last Pain:  Vitals:   06/25/23 1057  PainSc: 0-No pain         Complications: No notable events documented.

## 2023-06-25 NOTE — Anesthesia Preprocedure Evaluation (Signed)
 Anesthesia Evaluation  Patient identified by MRN, date of birth, ID band Patient awake    Reviewed: Allergy & Precautions, NPO status , Patient's Chart, lab work & pertinent test results, reviewed documented beta blocker date and time   History of Anesthesia Complications Negative for: history of anesthetic complications  Airway Mallampati: III  TM Distance: >3 FB     Dental no notable dental hx.    Pulmonary neg COPD, neg PE   breath sounds clear to auscultation       Cardiovascular hypertension, (-) angina (-) CAD, (-) Past MI and (-) Cardiac Stents  Rhythm:Regular Rate:Normal     Neuro/Psych neg Seizures    GI/Hepatic ,GERD  Medicated and Controlled,,(+) neg Cirrhosis        Endo/Other  diabetes, Gestational  Class 4 obesity  Renal/GU Renal disease     Musculoskeletal   Abdominal   Peds  Hematology  (+) Blood dyscrasia (10.5/258), anemia   Anesthesia Other Findings   Reproductive/Obstetrics (+) Pregnancy                              Anesthesia Physical Anesthesia Plan  ASA: 3  Anesthesia Plan: Combined Spinal and Epidural   Post-op Pain Management:    Induction:   PONV Risk Score and Plan:   Airway Management Planned: Nasal Cannula and Simple Face Mask  Additional Equipment:   Intra-op Plan:   Post-operative Plan:   Informed Consent: I have reviewed the patients History and Physical, chart, labs and discussed the procedure including the risks, benefits and alternatives for the proposed anesthesia with the patient or authorized representative who has indicated his/her understanding and acceptance.     Dental advisory given  Plan Discussed with: CRNA  Anesthesia Plan Comments:          Anesthesia Quick Evaluation

## 2023-06-26 ENCOUNTER — Encounter (HOSPITAL_COMMUNITY): Payer: Self-pay | Admitting: Family Medicine

## 2023-06-26 LAB — CBC
HCT: 28.5 % — ABNORMAL LOW (ref 36.0–46.0)
Hemoglobin: 9 g/dL — ABNORMAL LOW (ref 12.0–15.0)
MCH: 25.2 pg — ABNORMAL LOW (ref 26.0–34.0)
MCHC: 31.6 g/dL (ref 30.0–36.0)
MCV: 79.8 fL — ABNORMAL LOW (ref 80.0–100.0)
Platelets: 245 10*3/uL (ref 150–400)
RBC: 3.57 MIL/uL — ABNORMAL LOW (ref 3.87–5.11)
RDW: 14.3 % (ref 11.5–15.5)
WBC: 7.3 10*3/uL (ref 4.0–10.5)
nRBC: 0 % (ref 0.0–0.2)

## 2023-06-26 MED ORDER — FERROUS SULFATE 325 (65 FE) MG PO TABS
325.0000 mg | ORAL_TABLET | ORAL | Status: DC
  Administered 2023-06-26: 325 mg via ORAL
  Filled 2023-06-26: qty 1

## 2023-06-26 NOTE — Progress Notes (Signed)
 POSTPARTUM PROGRESS NOTE  POD #1  Subjective:  Anna Anna [name] is a 28 y.o. [id number] s/p rLTCS at [redacted]w[redacted]d. No acute events overnight. She reports she is doing well. She denies any problems with ambulating or po intake. She denies voiding or passing a bowel movement yet. Denies nausea or vomiting. She has not yet passed flatus. Pain is well controlled.  Lochia is normal.  Objective: Blood pressure 128/81, pulse (!) 57, temperature 98.2 F (36.8 C), temperature source Oral, resp. rate 17, height 5\' 3"  (1.6 m), weight (!) 183.7 kg, last menstrual period 10/05/2022, SpO2 100%, unknown if currently breastfeeding.  Physical Exam:  General: alert, cooperative and no distress Chest: no respiratory distress Heart: regular rate, distal pulses intact Uterine Fundus: firm, appropriately tender DVT Evaluation: No calf swelling or tenderness Extremities: trace edema Skin: warm, dry; incision clean/dry/intact w/honeycomb dressing in place  Recent Labs    06/23/23 1330 06/26/23 0450  HGB 10.5* 9.0*  HCT 33.6* 28.5*    Assessment/Plan: Anna Anna [name] is a 28 y.o. [id number] s/p rLTCS at [redacted]w[redacted]d for scheduled c-section.  POD#1 - Doing welll; pain well controlled. H/H appropriate  Routine postpartum care  OOB, ambulated  Lovenox  for VTE prophylaxis Acute Blood Loss Anemia: asymptomatic, CBC on 06/26/23 RBC 3.57 Hgb 9.0  Anna [name] po ferrous sulfate  every other day Contraception: POPs Feeding: Breast and bottle  cHTN: Most recent BP 128/81 Continue Procardia  60 mg daily, Furosemide  20 mg daily, and Potassium chloride  20 mEq daily A2GDM: 2hr OGTT post partum  Dispo: Plan for discharge on 06/27/23 if meeting all goals.   LOS: 1 day   Park Bolk, Medical Student OB Fellow  06/26/2023, 8:51 AM

## 2023-06-26 NOTE — Progress Notes (Signed)
 CSW received consult for hx of marijuana use.  Referral was screened out due to the following:  ~MOB had no documented substance use after initial prenatal visit/+UPT.  ~MOB had no positive drug screens after initial prenatal visit/+UPT.  ~Baby's UDS is negative.  Please consult CSW if current concerns arise or by MOB's request.  CSW will monitor CDS results and make report to Child Protective Services if warranted.  CSW identifies no further need for intervention and no barriers to discharge at this time.  Jenney Modest, Milinda Allen Clinical Social Worker [phone]

## 2023-06-27 ENCOUNTER — Other Ambulatory Visit (HOSPITAL_COMMUNITY): Payer: Self-pay

## 2023-06-27 MED ORDER — IBUPROFEN 600 MG PO TABS
600.0000 mg | ORAL_TABLET | Freq: Four times a day (QID) | ORAL | 0 refills | Status: DC
  Filled 2023-06-27: qty 30, 8d supply, fill #0

## 2023-06-27 MED ORDER — POTASSIUM CHLORIDE CRYS ER 20 MEQ PO TBCR
20.0000 meq | EXTENDED_RELEASE_TABLET | Freq: Every day | ORAL | 0 refills | Status: DC
  Filled 2023-06-27: qty 5, 5d supply, fill #0

## 2023-06-27 MED ORDER — FUROSEMIDE 20 MG PO TABS
20.0000 mg | ORAL_TABLET | Freq: Every day | ORAL | 0 refills | Status: DC
  Filled 2023-06-27: qty 5, 5d supply, fill #0

## 2023-06-27 MED ORDER — OXYCODONE HCL 5 MG PO TABS
5.0000 mg | ORAL_TABLET | Freq: Four times a day (QID) | ORAL | 0 refills | Status: DC | PRN
  Filled 2023-06-27: qty 10, 3d supply, fill #0

## 2023-06-27 MED ORDER — ACETAMINOPHEN 500 MG PO TABS
1000.0000 mg | ORAL_TABLET | Freq: Four times a day (QID) | ORAL | 0 refills | Status: DC
  Filled 2023-06-27: qty 30, 4d supply, fill #0

## 2023-06-27 MED ORDER — SENNOSIDES-DOCUSATE SODIUM 8.6-50 MG PO TABS
2.0000 | ORAL_TABLET | Freq: Every day | ORAL | 0 refills | Status: DC
  Filled 2023-06-27: qty 60, 30d supply, fill #0

## 2023-06-28 ENCOUNTER — Other Ambulatory Visit: Payer: Medicaid Other

## 2023-06-30 ENCOUNTER — Inpatient Hospital Stay (HOSPITAL_COMMUNITY)
Admission: AD | Admit: 2023-06-30 | Discharge: 2023-06-30 | Disposition: A | Discharge status: Home / Self Care | Attending: Obstetrics & Gynecology | Admitting: Obstetrics & Gynecology

## 2023-06-30 DIAGNOSIS — O9089 Other complications of the puerperium, not elsewhere classified: Secondary | ICD-10-CM | POA: Diagnosis present

## 2023-06-30 DIAGNOSIS — Z98891 History of uterine scar from previous surgery: Secondary | ICD-10-CM

## 2023-06-30 MED ORDER — NIFEDIPINE ER OSMOTIC RELEASE 30 MG PO TB24
60.0000 mg | ORAL_TABLET | Freq: Once | ORAL | Status: AC
  Administered 2023-06-30: 60 mg via ORAL
  Filled 2023-06-30: qty 2

## 2023-06-30 MED ORDER — AMOXICILLIN-POT CLAVULANATE 875-125 MG PO TABS: 1.0000 | ORAL_TABLET | Freq: Two times a day (BID) | ORAL | 0 refills | Status: DC

## 2023-06-30 NOTE — MAU Note (Signed)
 NATALIA WITTMEYER is a 28 y.o. at here in MAU reporting: PP, c/s 5/27. Noted smell from incision yesterday, today lifted abd and "wanted to run away".  Incision is under panus. Has wound vac in place, is working.  Vag bleeding is light. Is able to urinate.  Has had a bm.   Baby is doing well. Is bottle feeding, when asked about breasts, pt said 'she needs to pump'.  She is not pumping and feeding, explained to pt, the more she pumps that will encourage the breast milk to come in.. denies breast pain.  Did not take BP medicine this morning. Onset of complaint: yesterday Pain score: little sore. Vitals:   06/30/23 0900 06/30/23 0925  BP: (!) 152/89 (!) 140/83  Pulse: 92 92  Resp: 16 18  Temp: 97.9 F (36.6 C)   SpO2: 100% 100%      Lab orders placed from triage:     Edema noted in tissue in lower abd.  Pt is on lasix  and BP medication.  No redness or hot spots noted.

## 2023-06-30 NOTE — MAU Provider Note (Addendum)
 Event Date/Time   First Provider Initiated Contact with Patient 06/30/23 1001      S Ms. Anna [name] is a 28 y.o. [phone] pregnant female at Unknown who presents to MAU today with complaint of foul smelling discharge from incision. Pt states that yesterday she had the tubing detach from the incision itself but was able to reattach it.  She states the fluid was dark in color but between then and now she's noticed a foul smelling odor.  Denies F/C, pain.   C/S was complicated by needing repair of skin incision as it had opened up when trying to place wound vac. BP mildly elevated but patient did not take her medications this morning, asymptomatic.   Receives care at [facility]. Prenatal records reviewed.  Pertinent items noted in HPI and remainder of comprehensive ROS otherwise negative.   O BP (!) 146/94   Pulse 90   Temp 97.9 F (36.6 C) (Oral)   Resp 18   Ht 5\' 3"  (1.6 m)   Wt (!) 177.1 kg   SpO2 100%   Breastfeeding No   BMI 69.17 kg/m  Physical Exam Vitals and nursing note reviewed. Exam conducted with a chaperone present.  Constitutional:      General: She is not in acute distress.    Appearance: Normal appearance. She is obese. She is not ill-appearing.  HENT:     Head: Normocephalic and atraumatic.     Right Ear: External ear normal.     Left Ear: External ear normal.     Nose: Nose normal. No congestion.     Mouth/Throat:     Mouth: Mucous membranes are moist.     Pharynx: Oropharynx is clear.  Eyes:     Extraocular Movements: Extraocular movements intact.     Conjunctiva/sclera: Conjunctivae normal.  Cardiovascular:     Rate and Rhythm: Normal rate.  Pulmonary:     Effort: Pulmonary effort is normal. No respiratory distress.  Abdominal:     General: There is no distension.     Palpations: Abdomen is soft.     Tenderness: There is no abdominal tenderness. There is no guarding.     Comments: Wound vac with poorly compressed foam and lifting of dressing on superior  midline, underneath incision w/o drainage but with foul odor, no significant erythema or areas of induration obvious, see pic below  Musculoskeletal:        General: No swelling. Normal range of motion.     Cervical back: Normal range of motion.  Skin:    General: Skin is warm and dry.  Neurological:     Mental Status: She is alert and oriented to person, place, and time. Mental status is at baseline.  Psychiatric:        Mood and Affect: Mood normal.        Behavior: Behavior normal.      MDM: MAU Course:  Foul smelling odor improved with removal of wound vac and gentle cleaning.  Incision appears to be healing well.  Spoke with Dr. Randolm Butte and will send out on Augmentin  and have her use barrier to keep skin off skin and incision dry with BID gentle cleanings with warm soapy water  daily until incision check later this week at [facility]. No signs of yeast but pt is allergic to Diflucan so will hold from treating any potential yeast component at this time. Stable for d/c at this time.  AP #Postpartum incision odor  - improved with gentle cleaning and removal  of malfunctioning wound vac - ppx Augmentin   - f/u with [facility] for incision check vs [facility]U if worsening condition before visit  Discharge from MAU in stable condition with strict/usual precautions Follow up at [facility] as scheduled for ongoing prenatal care  Allergies as of 06/30/2023       Reactions   Diflucan [fluconazole] Rash        Medication List     TAKE these medications    Accu-Chek Guide Me w/Device Kit 1 each by Does not apply route 4 (four) times daily.   Accu-Chek Softclix Lancets lancets Use as instructed to check blood sugar 4 times daily   Acetaminophen  Extra Strength 500 MG Tabs Take 2 tablets (1,000 mg total) by mouth every 6 (six) hours.   amoxicillin -clavulanate 875-125 MG tablet Commonly known as: AUGMENTIN  Take 1 tablet by mouth 2 (two) times daily.   ferrous sulfate  325 (65 FE) MG tablet Take 1 tablet (325 mg  total) by mouth every other day.   furosemide  20 MG tablet Commonly known as: LASIX  Take 1 tablet (20 mg total) by mouth daily.   glucose blood test strip Use as instructed to check blood sugar four times daily   ibuprofen  600 MG tablet Commonly known as: ADVIL  Take 1 tablet (600 mg total) by mouth every 6 (six) hours.   NIFEdipine  60 MG 24 hr tablet Commonly known as: ADALAT  CC Take 1 tablet (60 mg total) by mouth daily.   oxyCODONE  5 MG immediate release tablet Commonly known as: Oxy IR/ROXICODONE  Take 1 tablet (5 mg total) by mouth every 6 (six) hours as needed for severe pain (pain score 7-10) or breakthrough pain.   pantoprazole  40 MG tablet Commonly known as: Protonix  Take 1 tablet (40 mg total) by mouth daily.   potassium chloride  SA 20 MEQ tablet Commonly known as: KLOR-CON  M Take 1 tablet (20 mEq total) by mouth daily.   PRENATAL GUMMIES PO Take 2 capsules by mouth daily.   Senna-S 8.6-50 MG tablet Generic drug: senna-docusate Take 2 tablets by mouth daily.        Ebony Goldstein, MD 06/30/2023 10:44 AM

## 2023-07-01 ENCOUNTER — Encounter: Admitting: Obstetrics & Gynecology

## 2023-07-01 ENCOUNTER — Other Ambulatory Visit: Payer: Medicaid Other

## 2023-07-02 ENCOUNTER — Telehealth: Payer: Self-pay

## 2023-07-02 ENCOUNTER — Encounter: Payer: Self-pay | Admitting: Obstetrics and Gynecology

## 2023-07-02 ENCOUNTER — Ambulatory Visit: Admitting: Obstetrics and Gynecology

## 2023-07-02 VITALS — BP 129/77 | HR 92 | Ht 63.0 in | Wt 388.4 lb

## 2023-07-02 DIAGNOSIS — Z98891 History of uterine scar from previous surgery: Secondary | ICD-10-CM

## 2023-07-02 DIAGNOSIS — O10919 Unspecified pre-existing hypertension complicating pregnancy, unspecified trimester: Secondary | ICD-10-CM

## 2023-07-02 NOTE — Progress Notes (Signed)
   GYNECOLOGY PROGRESS NOTE  History:  28 y.o. G2X5284 presents to [facility] office today incision check and bp check. Went to MAU 6/1 for odor at incision. Started abx yesterday. She has not noticed the odor, and the reports the pain is manageable with OTC meds as needed. She is using abd pads for barrier.   She is taking Nifedipine  60mg  for [facility] Maintenance Due  Topic Date Due   COVID-19 Vaccine (2 - 2024-25 season) 09/30/2022     Review of Systems:  Pertinent items are noted in HPI.   Objective:  Physical Exam Blood pressure 129/77, pulse 92, height 5\' 3"  (1.6 m), weight (!) 388 lb 6.4 oz (176.2 kg), not currently breastfeeding. VS reviewed, nursing note reviewed,  Constitutional: well developed, no distress Pulm/chest wall: normal effort Abdomen: soft, nontender Wound:approximated, without odor, without abnormal discharge, no erythema present Neuro: alert and oriented  Psych: affect normal  Assessment & Plan:  1. Status post C-section (Primary) On augmentin , Doing well, continue cleaning, continue barrier with pads Continue otc meds as needed Follow up before pp appt if needed   2. Chronic hypertension affecting pregnancy: Procardia  XL 60 qd Continue current tx with Nifedipine  60mg    Future Appointments  Date Time Provider Department Center  07/09/2023  8:40 AM Tobb, Kardie, DO [facility]  07/25/2023  8:30 AM [facility]  07/25/2023  8:30 AM Keene Past[place], DO [facility]  09/02/2023 12:00 PM [facility], NP [facility] None    Susi Eric, FNP

## 2023-07-02 NOTE — Telephone Encounter (Signed)
 Called to check in after discharge for [employer]. No answer. Unable to leave VM.

## 2023-07-04 ENCOUNTER — Other Ambulatory Visit: Payer: Medicaid Other

## 2023-07-08 ENCOUNTER — Other Ambulatory Visit: Payer: Medicaid Other

## 2023-07-09 ENCOUNTER — Telehealth: Payer: Self-pay

## 2023-07-09 ENCOUNTER — Ambulatory Visit: Admitting: Cardiology

## 2023-07-09 NOTE — Progress Notes (Signed)
 CSW completed a CPS report with [facility], due to CDS testing positive for THC.  CSW identifies no further need for intervention and no barriers to discharge at this time.  Jenney Modest, Milinda Allen Clinical Social Worker [phone]

## 2023-07-09 NOTE — Telephone Encounter (Signed)
 Called to complete weekly Impact Mom study questions. Unable to complete call. Will call back at later time.

## 2023-07-11 ENCOUNTER — Other Ambulatory Visit: Payer: Medicaid Other

## 2023-07-15 ENCOUNTER — Telehealth: Payer: Self-pay

## 2023-07-15 NOTE — Telephone Encounter (Signed)
 Called to complete weekly IMPACT MOM Study questions. Unable to complete call. Will call back at later time.

## 2023-07-22 ENCOUNTER — Telehealth: Payer: Self-pay

## 2023-07-22 NOTE — Telephone Encounter (Signed)
 Unable to complete call. Unable to leave VM. Will reach out through MyChart as final attempt.

## 2023-07-25 ENCOUNTER — Ambulatory Visit: Admitting: Obstetrics & Gynecology

## 2023-07-25 ENCOUNTER — Other Ambulatory Visit

## 2023-07-25 DIAGNOSIS — Z8632 Personal history of gestational diabetes: Secondary | ICD-10-CM

## 2023-08-14 ENCOUNTER — Other Ambulatory Visit

## 2023-08-14 ENCOUNTER — Ambulatory Visit: Admitting: Advanced Practice Midwife

## 2023-09-02 ENCOUNTER — Encounter: Payer: Self-pay | Admitting: Nurse Practitioner

## 2023-09-02 ENCOUNTER — Ambulatory Visit: Admitting: Nurse Practitioner

## 2023-09-02 NOTE — Progress Notes (Deleted)
 Established Patient Office Visit  Subjective  Patient ID: Anna [name], female    DOB: 1995/06/25  Age: 28 y.o. MRN: [id number]  No chief complaint on file.   HPI Anna [name] is  28 yrs old female presents 09/02/2023 fro chronic disease management follow up Patient Active Problem List   Diagnosis Date Noted   Iron deficiency anemia secondary to inadequate dietary iron intake 05/30/2023   Cholelithiasis affecting pregnancy in third trimester, antepartum 05/01/2023   Gestational diabetes 04/01/2023   Alpha thalassemia silent carrier 01/14/2023   History of cesarean delivery 12/31/2022   Generalized abdominal pain 06/28/2020   Chronic hypertension affecting pregnancy 08/29/2017   Metabolic syndrome X 11/20/2010   Pre-diabetes 07/10/2010   Obesity, morbid, BMI 50 or higher (HCC) 07/10/2010   History of hypertension in pediatric patient 07/10/2010   Past Medical History:  Diagnosis Date   Allergy    Diabetes mellitus without complication (HCC)    gestational   Fatty liver disease, nonalcoholic    Gall stones    GERD (gastroesophageal reflux disease)    Gestational diabetes    History of polyhydramnios 05/20/2023   AFI 28cm @ 24w, RESOLVED-13cm @ 28w, 12.5cm at [redacted]w[redacted]d      Hypertension    Morbid obesity with BMI of 70 and over, adult (HCC) 09/29/2017   with hypertension    Postpartum hypertension    with first pregnancy, returned postpartum preeclampsia and required ICU admission   Past Surgical History:  Procedure Laterality Date   CESAREAN SECTION N/A 09/29/2017   Procedure: CESAREAN SECTION;  Surgeon: Edsel Norleen GAILS, MD;  Location: [facility] BIRTHING SUITES;  Service: Obstetrics;  Laterality: N/A;   CESAREAN SECTION N/A 06/25/2023   Procedure: CESAREAN DELIVERY;  Surgeon: Ilean Norleen GAILS, MD;  Location: [facility] LD ORS;  Service: Obstetrics;  Laterality: N/A;   MYRINGOTOMY     TONSILLECTOMY AND ADENOIDECTOMY     Social History   Tobacco Use   Smoking status: Never    Smokeless tobacco: Never  Vaping Use   Vaping status: Never Used  Substance Use Topics   Alcohol use: No   Drug use: Yes    Types: Marijuana   Social History   Socioeconomic History   Marital status: Single    Spouse name: Not on file   Number of children: Not on file   Years of education: Not on file   Highest education level: Not on file  Occupational History   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    Comment: [employer]  Tobacco Use   Smoking status: Never   Smokeless tobacco: Never  Vaping Use   Vaping status: Never Used  Substance and Sexual Activity   Alcohol use: No   Drug use: Yes    Types: Marijuana   Sexual activity: Yes    Birth control/protection: None  Other Topics Concern   Not on file  Social History Narrative   Not on file   Social Drivers of Health   Financial Resource Strain: Low Risk  (01/01/2023)   Overall Financial Resource Strain (CARDIA)    Difficulty of Paying Living Expenses: Not hard at all  Food Insecurity: No Food Insecurity (06/25/2023)   Hunger Vital Sign    Worried About Running Out of Food in the Last Year: Never true    Ran Out of Food in the Last Year: Never true  Transportation Needs: No Transportation Needs (06/25/2023)   PRAPARE - Administrator, Civil Service (Medical):  No    Lack of Transportation (Non-Medical): No  Physical Activity: Insufficiently Active (01/01/2023)   Exercise Vital Sign    Days of Exercise per Week: 4 days    Minutes of Exercise per Session: 30 min  Stress: No Stress Concern Present (01/01/2023)   [employer] of Occupational Health - Occupational Stress Questionnaire    Feeling of Stress : Not at all  Social Connections: Moderately Isolated (01/01/2023)   Social Connection and Isolation Panel    Frequency of Communication with Friends and Family: More than three times a week    Frequency of Social Gatherings with Friends and Family: More than three times a week    Attends Religious  Services: 1 to 4 times per year    Active Member of [employer] or Organizations: No    Attends Banker Meetings: Never    Marital Status: Never married  Intimate Partner Violence: Not At Risk (06/25/2023)   Humiliation, Afraid, Rape, and Kick questionnaire    Fear of Current or Ex-Partner: No    Emotionally Abused: No    Physically Abused: No    Sexually Abused: No   Family Status  Relation Name Status   Mother  Alive   Father  Alive   Sister  Alive   Brother  Alive   MGM  Alive   MGF  Alive   PGM  Alive   PGF  Deceased   Daughter  Alive  No partnership data on file   Family History  Problem Relation Age of Onset   Asthma Mother    Anemia Mother    Hypertension Father    Cancer Maternal Grandfather    Hypertension Paternal Grandmother    Heart failure Paternal Grandmother    Allergies  Allergen Reactions   Diflucan [Fluconazole] Rash      ROS Negative unless indicated in HPI   Objective:     There were no vitals taken for this visit. BP Readings from Last 3 Encounters:  07/02/23 129/77  06/30/23 (!) 140/93  06/27/23 124/76   Wt Readings from Last 3 Encounters:  07/02/23 (!) 388 lb 6.4 oz (176.2 kg)  06/30/23 (!) 390 lb 8 oz (177.1 kg)  06/25/23 (!) 404 lb 14.4 oz (183.7 kg)      Physical Exam   No results found for any visits on 09/02/23.  Last CBC Lab Results  Component Value Date   WBC 7.3 06/26/2023   HGB 9.0 (L) 06/26/2023   HCT 28.5 (L) 06/26/2023   MCV 79.8 (L) 06/26/2023   MCH 25.2 (L) 06/26/2023   RDW 14.3 06/26/2023   PLT 245 06/26/2023   Last metabolic panel Lab Results  Component Value Date   GLUCOSE 74 06/17/2023   NA 137 06/17/2023   K 3.9 06/17/2023   CL 105 06/17/2023   CO2 22 06/17/2023   BUN 6 06/17/2023   CREATININE 0.56 06/25/2023   GFRNONAA >60 06/25/2023   CALCIUM  8.7 (L) 06/17/2023   PROT 6.6 06/17/2023   ALBUMIN 2.4 (L) 06/17/2023   LABGLOB 3.0 06/03/2023   AGRATIO 1.1 (L) 09/23/2017   BILITOT 0.6  06/17/2023   ALKPHOS 108 06/17/2023   AST 24 06/17/2023   ALT 40 06/17/2023   ANIONGAP 10 06/17/2023   Last lipids Lab Results  Component Value Date   CHOL 211 (H) 05/30/2023   HDL 51 05/30/2023   LDLCALC 135 (H) 05/30/2023   TRIG 139 05/30/2023   CHOLHDL 4.1 05/30/2023   Last hemoglobin A1c Lab  Results  Component Value Date   HGBA1C 5.6 05/30/2023   Last thyroid  functions Lab Results  Component Value Date   TSH 2.520 12/04/2016   T4TOTAL 16.0 (H) 05/30/2023        Assessment & Plan:  There are no diagnoses linked to this encounter.  No follow-ups on file.    @Niketa Turner  [place], [place]    Note: This document was prepared by Nech[employer] voice dictation technology and any errors that results from this process are unintentional.

## 2023-10-07 ENCOUNTER — Other Ambulatory Visit

## 2023-10-07 ENCOUNTER — Ambulatory Visit (INDEPENDENT_AMBULATORY_CARE_PROVIDER_SITE_OTHER): Admitting: Women's Health

## 2023-10-07 ENCOUNTER — Encounter: Payer: Self-pay | Admitting: Women's Health

## 2023-10-07 DIAGNOSIS — Z30018 Encounter for initial prescription of other contraceptives: Secondary | ICD-10-CM | POA: Diagnosis not present

## 2023-10-07 DIAGNOSIS — Z8632 Personal history of gestational diabetes: Secondary | ICD-10-CM | POA: Diagnosis not present

## 2023-10-07 DIAGNOSIS — Z8679 Personal history of other diseases of the circulatory system: Secondary | ICD-10-CM

## 2023-10-07 DIAGNOSIS — I10 Essential (primary) hypertension: Secondary | ICD-10-CM

## 2023-10-07 DIAGNOSIS — Z98891 History of uterine scar from previous surgery: Secondary | ICD-10-CM

## 2023-10-07 MED ORDER — PHEXXI 1.8-1-0.4 % VA GEL: 1.0000 | Freq: Once | VAGINAL | 11 refills | Status: AC

## 2023-10-07 NOTE — Progress Notes (Addendum)
 POSTPARTUM VISIT Patient name: Anna [name] MRN [id number]  Date of birth: Apr 12, 1995 Chief Complaint:   Postpartum Care  History of Present Illness:   Anna [name] is a 28 y.o. [phone] African American female being seen today for a postpartum visit. She is 14 weeks postpartum following a repeat cesarean section, low transverse incision at 37.4 gestational weeks. IOL: no, for n/a. Anesthesia: spinal.  Laceration: n/a.  Complications: none. Inpatient contraception: no.   Pregnancy complicated by [facility] (no meds prior to pregnancy), A1DM, morbid obesity. Tobacco use: no. Substance use disorder: no. Last pap smear: 02/01/23 and results were NILM w/ HRHPV negative. Next pap smear due: 2028 Patient's last menstrual period was 09/18/2023 (approximate).  Postpartum course has been uncomplicated. Bleeding none, has had 2 periods.. Bowel function is normal. Bladder function is normal. Urinary incontinence? no, fecal incontinence? no Patient is sexually active. Last sexual activity: unsure. Desired contraception: discussed options, wants Phexxi . Patient does not know want a pregnancy in the future.  Desired family size is uncertain #of children.   Upstream - 10/07/23 1058       Pregnancy Intention Screening   Does the patient want to become pregnant in the next year? No    Does the patient's partner want to become pregnant in the next year? No    Would the patient like to discuss contraceptive options today? No      Contraception Wrap Up   Current Method Abstinence    End Method Oral Contraceptive    Contraception Counseling Provided Yes         The pregnancy intention screening data noted above was reviewed. Potential methods of contraception were discussed. The patient elected to proceed with Oral Contraceptive.  Edinburgh Postpartum Depression Screening: negative  Edinburgh Postnatal Depression Scale - 10/07/23 1058       Edinburgh Postnatal Depression Scale:  In the Past 7  Days   I have been able to laugh and see the funny side of things. 0    I have looked forward with enjoyment to things. 0    I have blamed myself unnecessarily when things went wrong. 0    I have been anxious or worried for no good reason. 0    I have felt scared or panicky for no good reason. 0    Things have been getting on top of me. 0    I have been so unhappy that I have had difficulty sleeping. 0    I have felt sad or miserable. 0    I have been so unhappy that I have been crying. 0    The thought of harming myself has occurred to me. 0    Edinburgh Postnatal Depression Scale Total 0             05/30/2023    2:54 PM 01/01/2023   11:22 AM  GAD 7 : Generalized Anxiety Score  Nervous, Anxious, on Edge 0 0  Control/stop worrying 0 1  Worry too much - different things 0 1  Trouble relaxing 0 1  Restless 0 0  Easily annoyed or irritable 0 0  Afraid - awful might happen 0 1  Total GAD 7 Score 0 4  Anxiety Difficulty Not difficult at all      Baby's course has been uncomplicated. Baby is feeding by bottle. Infant has a pediatrician/family doctor? Yes.  Childcare strategy if returning to work/school: family.  Pt has material needs met for her and baby: Yes.  Review of Systems:   Pertinent items are noted in HPI Denies Abnormal vaginal discharge w/ itching/odor/irritation, headaches, visual changes, shortness of breath, chest pain, abdominal pain, severe nausea/vomiting, or problems with urination or bowel movements. Pertinent History Reviewed:  Reviewed past medical,surgical, obstetrical and family history.  Reviewed problem list, medications and allergies. OB History  Gravida Para Term Preterm AB Living  4 2 2  2 2   SAB IAB Ectopic Multiple Live Births  2   0 2    # Outcome Date GA Lbr Len/2nd Weight Sex Type Anes PTL Lv  4 Term 06/25/23 [redacted]w[redacted]d  6 lb 13 oz (3.09 kg) M CS-LTranv EPI, Spinal  LIV  3 SAB 2021          2 SAB 08/2018          1 Term 09/29/17 [redacted]w[redacted]d  7 lb 10 oz  (3.459 kg) F CS-LTranv   LIV     Complications: Failure to Progress in First Stage, Pre-eclampsia   Physical Assessment:   Vitals:   10/07/23 1100  BP: 132/89  Pulse: 76  Weight: (!) 388 lb (176 kg)  Height: 5' 3 (1.6 m)  Body mass index is 68.73 kg/m.       Physical Examination:   General appearance: alert, well appearing, and in no distress  Mental status: alert, oriented to person, place, and time  Skin: warm & dry   Cardiovascular: normal heart rate noted   Respiratory: normal respiratory effort, no distress   Breasts: deferred, no complaints   Abdomen: soft, non-tender, c/s incision well healed   Pelvic: examination not indicated. Thin prep pap obtained: No  Rectal: not examined  Extremities: Edema: none   Chaperone: N/A       No results found for this or any previous visit (from the past 24 hours).  Assessment & Plan:  1) Postpartum exam 2) 14 wks s/p repeat cesarean section, low transverse incision 3) bottle feeding 4) Depression screening 5) Contraception management: rx Phexxi  to [facility], discussed use 6) CHTN> no meds prior to pregnancy, off nifedipine  now, f/u w/ PCP 7) A1DM during pregnancy> GTT today  Essential components of care per ACOG recommendations:  1.  Mood and well being:  If positive depression screen, discussed and plan developed.  If using tobacco we discussed reduction/cessation and risk of relapse If current substance abuse, we discussed and referral to local resources was offered.   2. Infant care and feeding:  If breastfeeding, discussed returning to work, pumping, breastfeeding-associated pain, guidance regarding return to fertility while lactating if not using another method. If needed, patient was provided with a letter to be allowed to pump q 2-3hrs to support lactation in a private location with access to a refrigerator to store breastmilk.   Recommended that all caregivers be immunized for flu, pertussis and other preventable  communicable diseases If pt does not have material needs met for her/baby, referred to local resources for help obtaining these.  3. Sexuality, contraception and birth spacing Provided guidance regarding sexuality, management of dyspareunia, and resumption of intercourse Discussed avoiding interpregnancy interval <94mths and recommended birth spacing of 18 months  4. Sleep and fatigue Discussed coping options for fatigue and sleep disruption Encouraged family/partner/community support of 4 hrs of uninterrupted sleep to help with mood and fatigue  5. Physical recovery  If pt had a C/S, assessed incisional pain and providing guidance on normal vs prolonged recovery If pt had a laceration, perineal healing and pain reviewed.  If urinary  or fecal incontinence, discussed management and referred to PT or uro/gyn if indicated  Patient is safe to resume physical activity. Discussed attainment of healthy weight.  6.  Chronic disease management Discussed pregnancy complications if any, and their implications for future childbearing and long-term maternal health. Review recommendations for prevention of recurrent pregnancy complications, such as 17 hydroxyprogesterone caproate to reduce risk for recurrent PTB not applicable, or aspirin  to reduce risk of preeclampsia did not discuss. Pt had GDM: yes. If yes, 2hr GTT scheduled: yes, doing today. Reviewed medications and non-pregnant dosing including consideration of whether pt is breastfeeding using a reliable resource such as LactMed: not applicable Referred for f/u w/ PCP or subspecialist providers as indicated: yes  7. Health maintenance Mammogram at 28yo or earlier if indicated Pap smears as indicated  Meds:  Meds ordered this encounter  Medications   Lactic Ac-Citric Ac-Pot Bitart (PHEXXI ) 1.8-1-0.4 % GEL    Sig: Place 1 Applicatorful vaginally once for 1 dose. Up to 1 hour before sex    Dispense:  180 g    Refill:  11    Follow-up:  Return in about 1 year (around 10/06/2024) for Physical.   No orders of the defined types were placed in this encounter.   Suzen JONELLE Fetters CNM, [facility] 10/07/2023 11:26 AM

## 2023-10-08 NOTE — Progress Notes (Deleted)
 Established Patient Office Visit  Subjective  Patient ID: Anna [name], female    DOB: 03/24/1995  Age: 28 y.o. MRN: [id number]  No chief complaint on file.   HPI Anna [name] Patient Active Problem List   Diagnosis Date Noted   Iron deficiency anemia secondary to inadequate dietary iron intake 05/30/2023   Cholelithiasis affecting pregnancy in third trimester, antepartum 05/01/2023   History of gestational diabetes 04/01/2023   Alpha thalassemia silent carrier 01/14/2023   History of cesarean delivery 12/31/2022   History of chronic hypertension 08/29/2017   Metabolic syndrome X 11/20/2010   Pre-diabetes 07/10/2010   Obesity, morbid, BMI 50 or higher (HCC) 07/10/2010   History of hypertension in pediatric patient 07/10/2010   Past Medical History:  Diagnosis Date   Allergy    Diabetes mellitus without complication (HCC)    gestational   Fatty liver disease, nonalcoholic    Gall stones    GERD (gastroesophageal reflux disease)    Gestational diabetes    History of polyhydramnios 05/20/2023   AFI 28cm @ 24w, RESOLVED-13cm @ 28w, 12.5cm at [redacted]w[redacted]d      Hypertension    Morbid obesity with BMI of 70 and over, adult (HCC) 09/29/2017   with hypertension    Postpartum hypertension    with first pregnancy, returned postpartum preeclampsia and required ICU admission   Past Surgical History:  Procedure Laterality Date   CESAREAN SECTION N/A 09/29/2017   Procedure: CESAREAN SECTION;  Surgeon: Edsel Norleen GAILS, MD;  Location: [facility] BIRTHING SUITES;  Service: Obstetrics;  Laterality: N/A;   CESAREAN SECTION N/A 06/25/2023   Procedure: CESAREAN DELIVERY;  Surgeon: Ilean Norleen GAILS, MD;  Location: [facility] LD ORS;  Service: Obstetrics;  Laterality: N/A;   MYRINGOTOMY     TONSILLECTOMY AND ADENOIDECTOMY     Social History   Tobacco Use   Smoking status: Never   Smokeless tobacco: Never  Vaping Use   Vaping status: Never Used  Substance Use Topics   Alcohol use: No   Drug  use: Yes    Types: Marijuana   Social History   Socioeconomic History   Marital status: Single    Spouse name: Not on file   Number of children: Not on file   Years of education: Not on file   Highest education level: Not on file  Occupational History   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    Comment: [employer]  Tobacco Use   Smoking status: Never   Smokeless tobacco: Never  Vaping Use   Vaping status: Never Used  Substance and Sexual Activity   Alcohol use: No   Drug use: Yes    Types: Marijuana   Sexual activity: Yes    Birth control/protection: None  Other Topics Concern   Not on file  Social History Narrative   Not on file   Social Drivers of Health   Financial Resource Strain: Low Risk  (01/01/2023)   Overall Financial Resource Strain (CARDIA)    Difficulty of Paying Living Expenses: Not hard at all  Food Insecurity: No Food Insecurity (06/25/2023)   Hunger Vital Sign    Worried About Running Out of Food in the Last Year: Never true    Ran Out of Food in the Last Year: Never true  Transportation Needs: No Transportation Needs (06/25/2023)   PRAPARE - Administrator, Civil Service (Medical): No    Lack of Transportation (Non-Medical): No  Physical Activity: Insufficiently Active (01/01/2023)   Exercise  Vital Sign    Days of Exercise per Week: 4 days    Minutes of Exercise per Session: 30 min  Stress: No Stress Concern Present (01/01/2023)   [employer] of Occupational Health - Occupational Stress Questionnaire    Feeling of Stress : Not at all  Social Connections: Moderately Isolated (01/01/2023)   Social Connection and Isolation Panel    Frequency of Communication with Friends and Family: More than three times a week    Frequency of Social Gatherings with Friends and Family: More than three times a week    Attends Religious Services: 1 to 4 times per year    Active Member of [employer] or Organizations: No    Attends Banker  Meetings: Never    Marital Status: Never married  Intimate Partner Violence: Not At Risk (06/25/2023)   Humiliation, Afraid, Rape, and Kick questionnaire    Fear of Current or Ex-Partner: No    Emotionally Abused: No    Physically Abused: No    Sexually Abused: No   Family Status  Relation Name Status   Mother  Alive   Father  Alive   Sister  Alive   Brother  Alive   MGM  Alive   MGF  Alive   PGM  Alive   PGF  Deceased   Daughter  Alive  No partnership data on file   Family History  Problem Relation Age of Onset   Asthma Mother    Anemia Mother    Hypertension Father    Cancer Maternal Grandfather    Hypertension Paternal Grandmother    Heart failure Paternal Grandmother    Allergies  Allergen Reactions   Diflucan [Fluconazole] Rash      ROS Negative unless indicated in HPI   Objective:     LMP 09/18/2023 (Approximate)  BP Readings from Last 3 Encounters:  10/07/23 132/89  07/02/23 129/77  06/30/23 (!) 140/93   Wt Readings from Last 3 Encounters:  10/07/23 (!) 388 lb (176 kg)  07/02/23 (!) 388 lb 6.4 oz (176.2 kg)  06/30/23 (!) 390 lb 8 oz (177.1 kg)      Physical Exam   No results found for any visits on 10/09/23.  Last CBC Lab Results  Component Value Date   WBC 7.3 06/26/2023   HGB 9.0 (L) 06/26/2023   HCT 28.5 (L) 06/26/2023   MCV 79.8 (L) 06/26/2023   MCH 25.2 (L) 06/26/2023   RDW 14.3 06/26/2023   PLT 245 06/26/2023   Last metabolic panel Lab Results  Component Value Date   GLUCOSE 74 06/17/2023   NA 137 06/17/2023   K 3.9 06/17/2023   CL 105 06/17/2023   CO2 22 06/17/2023   BUN 6 06/17/2023   CREATININE 0.56 06/25/2023   GFRNONAA >60 06/25/2023   CALCIUM  8.7 (L) 06/17/2023   PROT 6.6 06/17/2023   ALBUMIN 2.4 (L) 06/17/2023   LABGLOB 3.0 06/03/2023   AGRATIO 1.1 (L) 09/23/2017   BILITOT 0.6 06/17/2023   ALKPHOS 108 06/17/2023   AST 24 06/17/2023   ALT 40 06/17/2023   ANIONGAP 10 06/17/2023   Last lipids Lab Results   Component Value Date   CHOL 211 (H) 05/30/2023   HDL 51 05/30/2023   LDLCALC 135 (H) 05/30/2023   TRIG 139 05/30/2023   CHOLHDL 4.1 05/30/2023   Last hemoglobin A1c Lab Results  Component Value Date   HGBA1C 5.6 05/30/2023   Last thyroid  functions Lab Results  Component Value Date   TSH  2.520 12/04/2016   T4TOTAL 16.0 (H) 05/30/2023        Assessment & Plan:  There are no diagnoses linked to this encounter. Continue healthy lifestyle choices, including diet (rich in fruits, vegetables, and lean proteins, and low in salt and simple carbohydrates) and exercise (at least 30 minutes of moderate physical activity daily).     The above assessment and management plan was discussed with the patient. The patient verbalized understanding of and has agreed to the management plan. Patient is aware to call the clinic if they develop any new symptoms or if symptoms persist or worsen. Patient is aware when to return to the clinic for a follow-up visit. Patient educated on when it is appropriate to go to the emergency department.  No follow-ups on file.    Farhiya Rosten [place], DNP [facility] [address] [place], [place] [address] [phone]    Note: This document was prepared by [employer] voice dictation technology and any errors that results from this process are unintentional.

## 2023-10-09 ENCOUNTER — Ambulatory Visit: Admitting: Nurse Practitioner

## 2023-10-09 ENCOUNTER — Ambulatory Visit: Payer: Self-pay | Admitting: Women's Health

## 2023-10-09 DIAGNOSIS — E782 Mixed hyperlipidemia: Secondary | ICD-10-CM

## 2023-10-09 DIAGNOSIS — Z8632 Personal history of gestational diabetes: Secondary | ICD-10-CM

## 2023-10-09 DIAGNOSIS — D508 Other iron deficiency anemias: Secondary | ICD-10-CM

## 2023-10-09 LAB — GLUCOSE TOLERANCE, 2 HOURS W/ 1HR
Glucose, 1 hour: 115 mg/dL (ref 70–179)
Glucose, 2 hour: 103 mg/dL (ref 70–152)
Glucose, Fasting: 81 mg/dL (ref 70–91)

## 2023-10-10 ENCOUNTER — Encounter: Payer: Self-pay | Admitting: Nurse Practitioner

## 2023-11-23 DIAGNOSIS — Z20822 Contact with and (suspected) exposure to covid-19: Secondary | ICD-10-CM | POA: Diagnosis not present

## 2023-11-23 DIAGNOSIS — J029 Acute pharyngitis, unspecified: Secondary | ICD-10-CM | POA: Diagnosis not present

## 2023-11-23 DIAGNOSIS — J209 Acute bronchitis, unspecified: Secondary | ICD-10-CM | POA: Diagnosis not present

## 2023-12-19 ENCOUNTER — Ambulatory Visit: Admitting: Nurse Practitioner

## 2023-12-24 ENCOUNTER — Encounter: Payer: Self-pay | Admitting: Nurse Practitioner

## 2024-02-17 ENCOUNTER — Encounter (HOSPITAL_BASED_OUTPATIENT_CLINIC_OR_DEPARTMENT_OTHER): Payer: Self-pay | Admitting: Emergency Medicine

## 2024-02-17 ENCOUNTER — Other Ambulatory Visit: Payer: Self-pay

## 2024-02-17 DIAGNOSIS — D509 Iron deficiency anemia, unspecified: Secondary | ICD-10-CM | POA: Diagnosis not present

## 2024-02-17 DIAGNOSIS — R739 Hyperglycemia, unspecified: Secondary | ICD-10-CM | POA: Diagnosis present

## 2024-02-17 DIAGNOSIS — G43109 Migraine with aura, not intractable, without status migrainosus: Secondary | ICD-10-CM | POA: Insufficient documentation

## 2024-02-17 DIAGNOSIS — E1165 Type 2 diabetes mellitus with hyperglycemia: Secondary | ICD-10-CM | POA: Diagnosis not present

## 2024-02-17 DIAGNOSIS — I10 Essential (primary) hypertension: Secondary | ICD-10-CM | POA: Diagnosis not present

## 2024-02-17 LAB — CBC
HCT: 37.5 % (ref 36.0–46.0)
Hemoglobin: 11.4 g/dL — ABNORMAL LOW (ref 12.0–15.0)
MCH: 23.9 pg — ABNORMAL LOW (ref 26.0–34.0)
MCHC: 30.4 g/dL (ref 30.0–36.0)
MCV: 78.6 fL — ABNORMAL LOW (ref 80.0–100.0)
Platelets: 365 K/uL (ref 150–400)
RBC: 4.77 MIL/uL (ref 3.87–5.11)
RDW: 15.6 % — ABNORMAL HIGH (ref 11.5–15.5)
WBC: 9 K/uL (ref 4.0–10.5)
nRBC: 0 % (ref 0.0–0.2)

## 2024-02-17 NOTE — ED Triage Notes (Signed)
 Patient c/o numbness numbness to tongue and face, and left hand and finger numbness, and left foot and toe numbness that went away.  Patient now reports a headache.  Patient states she has also had abdominal pain and diarrhea x 3 days.

## 2024-02-18 ENCOUNTER — Emergency Department (HOSPITAL_BASED_OUTPATIENT_CLINIC_OR_DEPARTMENT_OTHER)

## 2024-02-18 ENCOUNTER — Emergency Department (HOSPITAL_BASED_OUTPATIENT_CLINIC_OR_DEPARTMENT_OTHER)
Admission: EM | Admit: 2024-02-18 | Discharge: 2024-02-18 | Disposition: A | Discharge status: Home / Self Care | Attending: Emergency Medicine | Admitting: Emergency Medicine

## 2024-02-18 DIAGNOSIS — R739 Hyperglycemia, unspecified: Secondary | ICD-10-CM

## 2024-02-18 DIAGNOSIS — G43109 Migraine with aura, not intractable, without status migrainosus: Secondary | ICD-10-CM

## 2024-02-18 DIAGNOSIS — D509 Iron deficiency anemia, unspecified: Secondary | ICD-10-CM

## 2024-02-18 LAB — COMPREHENSIVE METABOLIC PANEL WITH GFR
ALT: 43 U/L (ref 0–44)
AST: 32 U/L (ref 15–41)
Albumin: 4.2 g/dL (ref 3.5–5.0)
Alkaline Phosphatase: 71 U/L (ref 38–126)
Anion gap: 15 (ref 5–15)
BUN: 9 mg/dL (ref 6–20)
CO2: 20 mmol/L — ABNORMAL LOW (ref 22–32)
Calcium: 9.1 mg/dL (ref 8.9–10.3)
Chloride: 101 mmol/L (ref 98–111)
Creatinine, Ser: 0.62 mg/dL (ref 0.44–1.00)
GFR, Estimated: >60 mL/min
Glucose, Bld: 108 mg/dL — ABNORMAL HIGH (ref 70–99)
Potassium: 4 mmol/L (ref 3.5–5.1)
Sodium: 135 mmol/L (ref 135–145)
Total Bilirubin: 0.4 mg/dL (ref 0.0–1.2)
Total Protein: 8.2 g/dL — ABNORMAL HIGH (ref 6.5–8.1)

## 2024-02-18 LAB — LIPASE, BLOOD: Lipase: 10 U/L — ABNORMAL LOW (ref 11–51)

## 2024-02-18 MED ORDER — DEXAMETHASONE SOD PHOSPHATE PF 10 MG/ML IJ SOLN
10.0000 mg | Freq: Once | INTRAMUSCULAR | Status: AC
  Administered 2024-02-18: 10 mg via INTRAVENOUS
  Filled 2024-02-18: qty 1

## 2024-02-18 MED ORDER — SODIUM CHLORIDE 0.9 % IV BOLUS
1000.0000 mL | Freq: Once | INTRAVENOUS | Status: AC
  Administered 2024-02-18: 1000 mL via INTRAVENOUS

## 2024-02-18 MED ORDER — PROCHLORPERAZINE EDISYLATE 10 MG/2ML IJ SOLN
10.0000 mg | Freq: Once | INTRAMUSCULAR | Status: AC
  Administered 2024-02-18: 10 mg via INTRAVENOUS
  Filled 2024-02-18: qty 2

## 2024-02-18 NOTE — Discharge Instructions (Addendum)
 I believe that all of your symptoms were from a migraine headache.  Please follow-up with the neurologist for further evaluation.  You are welcome to return to the emergency department if you have any new or concerning symptoms.

## 2024-02-18 NOTE — ED Provider Notes (Signed)
 " [place] EMERGENCY DEPARTMENT AT [facility] Provider Note   CSN: [id number] Arrival date & time: 02/17/24  2334     Patient presents with: Numbness, Diarrhea, and Abdominal Pain   Anna [name] is a 29 y.o. female.   The history is provided by the patient.  Diarrhea Associated symptoms: abdominal pain   Abdominal Pain Associated symptoms: diarrhea    She has history of hypertension, diabetes, GERD and comes in because she had an episode where her tongue was tingling and then the left side of her face and left arm got numb.  This lasted about 20 minutes before resolving.  She was slightly lightheaded.  She is she thinks that she might of had some slight dry mouth.  She did not notice any weakness.  About 30 minutes later, she developed a right sided headache.  This headache is described as a dull pain.  There is some nausea but no vomiting.  She denies photophobia or phonophobia.    Prior to Admission medications  Medication Sig Start Date End Date Taking? Authorizing Provider  Accu-Chek Softclix Lancets lancets Use as instructed to check blood sugar 4 times daily Patient not taking: Reported on 10/07/2023 04/01/23   Kizzie Suzen SAUNDERS, CNM  acetaminophen  (TYLENOL ) 500 MG tablet Take 2 tablets (1,000 mg total) by mouth every 6 (six) hours. Patient not taking: Reported on 10/07/2023 06/27/23   Kumar, Agnijita, MD  Blood Glucose Monitoring Suppl (ACCU-CHEK GUIDE ME) w/Device KIT 1 each by Does not apply route 4 (four) times daily. Patient not taking: Reported on 10/07/2023 04/01/23   Kizzie Suzen SAUNDERS, CNM  ferrous sulfate  325 (65 FE) MG tablet Take 1 tablet (325 mg total) by mouth every other day. Patient not taking: Reported on 10/07/2023 04/23/23   Kizzie Suzen SAUNDERS, CNM  furosemide  (LASIX ) 20 MG tablet Take 1 tablet (20 mg total) by mouth daily. Patient not taking: Reported on 10/07/2023 06/27/23   Von Reasoner, MD  glucose blood test strip Use as instructed to check blood  sugar four times daily Patient not taking: Reported on 10/07/2023 04/01/23   Kizzie Suzen SAUNDERS, CNM  ibuprofen  (ADVIL ) 600 MG tablet Take 1 tablet (600 mg total) by mouth every 6 (six) hours. Patient not taking: Reported on 10/07/2023 06/27/23   Kumar, Agnijita, MD  NIFEdipine  (ADALAT  CC) 60 MG 24 hr tablet Take 1 tablet (60 mg total) by mouth daily. Patient not taking: Reported on 10/07/2023 06/03/23   Kizzie Suzen SAUNDERS, CNM  oxyCODONE  (OXY IR/ROXICODONE ) 5 MG immediate release tablet Take 1 tablet (5 mg total) by mouth every 6 (six) hours as needed for severe pain (pain score 7-10) or breakthrough pain. Patient not taking: Reported on 10/07/2023 06/27/23   Kumar, Agnijita, MD  pantoprazole  (PROTONIX ) 40 MG tablet Take 1 tablet (40 mg total) by mouth daily. Patient not taking: Reported on 10/07/2023 02/01/23   Jayne Vonn DEL, MD  potassium chloride  SA (KLOR-CON  M) 20 MEQ tablet Take 1 tablet (20 mEq total) by mouth daily. Patient not taking: Reported on 10/07/2023 06/27/23   Kumar, Agnijita, MD  Prenatal MV & Min w/FA-DHA (PRENATAL GUMMIES PO) Take 2 capsules by mouth daily. Patient not taking: Reported on 10/07/2023    [provider]  senna-docusate (SENOKOT-S) 8.6-50 MG tablet Take 2 tablets by mouth daily. Patient not taking: Reported on 10/07/2023 06/27/23   Kumar, Agnijita, MD  fluticasone  (FLONASE ) 50 MCG/ACT nasal spray Place 2 sprays into both nostrils daily. Patient not taking: Reported on 01/26/2019  10/23/18 06/12/19  Couture, Cortni S, PA-C    Allergies: Diflucan [fluconazole]    Review of Systems  Gastrointestinal:  Positive for abdominal pain and diarrhea.  All other systems reviewed and are negative.   Updated Vital Signs BP (!) 125/58   Pulse 62   Temp 98.9 F (37.2 C)   Resp 20   Wt (!) 177 kg   SpO2 98%   BMI 69.12 kg/m   Physical Exam Vitals and nursing note reviewed.   29 year old female, resting comfortably and in no acute distress. Vital signs are normal. Oxygen  saturation is 98%, which is normal. Head is normocephalic and atraumatic. PERRLA, EOMI.  Neck is supple.  There is tenderness palpation of the right paracervical muscles. Lungs are clear without rales, wheezes, or rhonchi. Chest is nontender. Heart has regular rate and rhythm without murmur. Abdomen is soft, flat, nontender. Neurologic: Awake and alert and oriented, cranial nerves are intact, there is no facial droop, tongue protrudes in the midline.  Strength is 5/5 in all 4 extremities.  Sensation is intact throughout.  There is no extinction on double simultaneous stimulation and there is no pronator drift.  (all labs ordered are listed, but only abnormal results are displayed) Labs Reviewed  LIPASE, BLOOD - Abnormal; Notable for the following components:      Result Value   Lipase 10 (*)    All other components within normal limits  COMPREHENSIVE METABOLIC PANEL WITH GFR - Abnormal; Notable for the following components:   CO2 20 (*)    Glucose, Bld 108 (*)    Total Protein 8.2 (*)    All other components within normal limits  CBC - Abnormal; Notable for the following components:   Hemoglobin 11.4 (*)    MCV 78.6 (*)    MCH 23.9 (*)    RDW 15.6 (*)    All other components within normal limits  URINALYSIS, ROUTINE W REFLEX MICROSCOPIC  PREGNANCY, URINE    EKG: None  Radiology: No results found.   Procedures   Medications Ordered in the ED - No data to display                                  Medical Decision Making Amount and/or Complexity of Data Reviewed Labs: ordered. Radiology: ordered.  Risk Prescription drug management.   Episode of paresthesias involving left side of face and left arm followed by right sided headache.  I am suspicious that this is actually a migraine headache with aura.  I have ordered CT of head and screening labs and migraine cocktail of normal saline solution and prochlorperazine .  I have reviewed her laboratory tests, and my  interpretation is elevated random glucose level which will need to be followed as an outpatient, mild anemia which is improved compared with baseline.  She had excellent relief of symptoms with above-noted treatment.  I have ordered a dose of dexamethasone  and I am referring her to neurology for follow-up.     Final diagnoses:  Migraine with aura and without status migrainosus, not intractable  Elevated random blood glucose level  Microcytic anemia    ED Discharge Orders          Ordered    Ambulatory referral to Neurology       Comments: An appointment is requested in approximately: 4 weeks   02/18/24 0437  Raford Lenis, MD 02/18/24 [phone]  "

## 2024-03-05 ENCOUNTER — Telehealth: Payer: Self-pay

## 2024-03-05 NOTE — Progress Notes (Signed)
 Complex Care Management Note Care Guide Note  03/05/2024 Name: Anna [name] MRN: [id number] DOB: 12/13/1995   Complex Care Management Outreach Attempts: An unsuccessful telephone outreach was attempted today to offer the patient information about available complex care management services.  Follow Up Plan:  Additional outreach attempts will be made to offer the patient complex care management information and services.   Encounter Outcome:  No Answer    Jon Colt [facility]  [employer] Guide, Phone: [phone] Fax: [phone] Website: [email or web address]

## 2024-03-06 ENCOUNTER — Telehealth: Payer: Self-pay

## 2024-03-06 NOTE — Progress Notes (Signed)
 Complex Care Management Note Care Guide Note  03/06/2024 Name: Anna [name] MRN: [id number] DOB: [birth year]   Complex Care Management Outreach Attempts: A second unsuccessful outreach was attempted today to offer the patient with information about available complex care management services.  Follow Up Plan:  Additional outreach attempts will be made to offer the patient complex care management information and services.   Encounter Outcome:  No Answer    Jon Colt [facility]  [facility] Guide, Phone: [phone] Fax: [phone] Website: [email or web address]
